# Patient Record
Sex: Male | Born: 1940 | Race: White | Hispanic: No | Smoking: Former smoker
Health system: Southern US, Community
[De-identification: ages and names within clinical notes are randomized; demographics above are authoritative.]

## PROBLEM LIST (undated history)

## (undated) DIAGNOSIS — R600 Localized edema: Secondary | ICD-10-CM

## (undated) DIAGNOSIS — E7800 Pure hypercholesterolemia, unspecified: Secondary | ICD-10-CM

## (undated) DIAGNOSIS — J449 Chronic obstructive pulmonary disease, unspecified: Secondary | ICD-10-CM

## (undated) DIAGNOSIS — A310 Pulmonary mycobacterial infection: Secondary | ICD-10-CM

## (undated) DIAGNOSIS — R609 Edema, unspecified: Secondary | ICD-10-CM

## (undated) DIAGNOSIS — Z87442 Personal history of urinary calculi: Secondary | ICD-10-CM

## (undated) DIAGNOSIS — H9192 Unspecified hearing loss, left ear: Secondary | ICD-10-CM

## (undated) DIAGNOSIS — F32A Depression, unspecified: Secondary | ICD-10-CM

## (undated) DIAGNOSIS — J189 Pneumonia, unspecified organism: Secondary | ICD-10-CM

## (undated) DIAGNOSIS — J329 Chronic sinusitis, unspecified: Secondary | ICD-10-CM

## (undated) DIAGNOSIS — I890 Lymphedema, not elsewhere classified: Secondary | ICD-10-CM

## (undated) DIAGNOSIS — M1990 Unspecified osteoarthritis, unspecified site: Secondary | ICD-10-CM

## (undated) DIAGNOSIS — K219 Gastro-esophageal reflux disease without esophagitis: Secondary | ICD-10-CM

## (undated) DIAGNOSIS — Z8719 Personal history of other diseases of the digestive system: Secondary | ICD-10-CM

## (undated) HISTORY — DX: Chronic obstructive pulmonary disease, unspecified: J44.9

## (undated) HISTORY — DX: Localized edema: R60.0

## (undated) HISTORY — PX: SHOULDER OPEN ROTATOR CUFF REPAIR: SHX2407

## (undated) HISTORY — PX: JOINT REPLACEMENT: SHX530

## (undated) HISTORY — DX: Edema, unspecified: R60.9

## (undated) HISTORY — PX: CATARACT EXTRACTION W/ INTRAOCULAR LENS  IMPLANT, BILATERAL: SHX1307

## (undated) HISTORY — DX: Unspecified hearing loss, left ear: H91.92

## (undated) HISTORY — DX: Chronic sinusitis, unspecified: J32.9

## (undated) HISTORY — PX: COLONOSCOPY W/ BIOPSIES AND POLYPECTOMY: SHX1376

---

## 1998-08-11 ENCOUNTER — Ambulatory Visit (HOSPITAL_COMMUNITY): Admission: RE | Admit: 1998-08-11 | Discharge: 1998-08-11 | Payer: Self-pay | Admitting: Internal Medicine

## 1998-08-11 ENCOUNTER — Encounter: Payer: Self-pay | Admitting: Internal Medicine

## 1999-03-28 ENCOUNTER — Ambulatory Visit (HOSPITAL_COMMUNITY): Admission: RE | Admit: 1999-03-28 | Discharge: 1999-03-28 | Payer: Self-pay | Admitting: Gastroenterology

## 1999-07-18 ENCOUNTER — Encounter: Admission: RE | Admit: 1999-07-18 | Discharge: 1999-07-18 | Payer: Self-pay | Admitting: Orthopedic Surgery

## 1999-07-18 ENCOUNTER — Encounter: Payer: Self-pay | Admitting: Orthopedic Surgery

## 2001-04-29 ENCOUNTER — Ambulatory Visit (HOSPITAL_COMMUNITY): Admission: RE | Admit: 2001-04-29 | Discharge: 2001-04-29 | Payer: Self-pay | Admitting: Gastroenterology

## 2001-04-29 ENCOUNTER — Encounter (INDEPENDENT_AMBULATORY_CARE_PROVIDER_SITE_OTHER): Payer: Self-pay | Admitting: *Deleted

## 2004-09-29 ENCOUNTER — Ambulatory Visit: Payer: Self-pay | Admitting: Internal Medicine

## 2005-05-12 ENCOUNTER — Ambulatory Visit: Payer: Self-pay | Admitting: Internal Medicine

## 2005-07-19 ENCOUNTER — Ambulatory Visit: Payer: Self-pay | Admitting: Pulmonary Disease

## 2005-07-25 ENCOUNTER — Ambulatory Visit: Payer: Self-pay | Admitting: Internal Medicine

## 2006-02-21 ENCOUNTER — Ambulatory Visit: Payer: Self-pay | Admitting: Internal Medicine

## 2006-03-15 ENCOUNTER — Ambulatory Visit: Payer: Self-pay | Admitting: Internal Medicine

## 2006-03-19 ENCOUNTER — Ambulatory Visit: Payer: Self-pay | Admitting: Cardiovascular Disease

## 2006-04-26 ENCOUNTER — Ambulatory Visit: Payer: Self-pay | Admitting: Internal Medicine

## 2006-08-28 ENCOUNTER — Ambulatory Visit: Payer: Self-pay | Admitting: Internal Medicine

## 2006-12-26 ENCOUNTER — Ambulatory Visit: Payer: Self-pay | Admitting: Internal Medicine

## 2007-03-08 ENCOUNTER — Ambulatory Visit: Payer: Self-pay | Admitting: Vascular Surgery

## 2007-08-09 ENCOUNTER — Ambulatory Visit: Payer: Self-pay | Admitting: Internal Medicine

## 2007-08-09 DIAGNOSIS — K5190 Ulcerative colitis, unspecified, without complications: Secondary | ICD-10-CM

## 2007-08-09 DIAGNOSIS — J328 Other chronic sinusitis: Secondary | ICD-10-CM

## 2007-08-09 DIAGNOSIS — K449 Diaphragmatic hernia without obstruction or gangrene: Secondary | ICD-10-CM | POA: Insufficient documentation

## 2007-08-09 DIAGNOSIS — J42 Unspecified chronic bronchitis: Secondary | ICD-10-CM | POA: Insufficient documentation

## 2007-12-08 ENCOUNTER — Ambulatory Visit (HOSPITAL_COMMUNITY): Admission: RE | Admit: 2007-12-08 | Discharge: 2007-12-08 | Payer: Self-pay | Admitting: Orthopedic Surgery

## 2008-01-06 ENCOUNTER — Telehealth (INDEPENDENT_AMBULATORY_CARE_PROVIDER_SITE_OTHER): Payer: Self-pay | Admitting: *Deleted

## 2008-02-05 ENCOUNTER — Ambulatory Visit: Payer: Self-pay | Admitting: Internal Medicine

## 2008-03-30 ENCOUNTER — Encounter: Payer: Self-pay | Admitting: Family

## 2008-05-12 ENCOUNTER — Encounter: Payer: Self-pay | Admitting: Family

## 2008-10-06 ENCOUNTER — Telehealth (INDEPENDENT_AMBULATORY_CARE_PROVIDER_SITE_OTHER): Payer: Self-pay | Admitting: *Deleted

## 2009-01-05 ENCOUNTER — Telehealth (INDEPENDENT_AMBULATORY_CARE_PROVIDER_SITE_OTHER): Payer: Self-pay | Admitting: *Deleted

## 2009-01-29 ENCOUNTER — Ambulatory Visit: Payer: Self-pay | Admitting: Diagnostic Radiology

## 2009-01-29 ENCOUNTER — Emergency Department (HOSPITAL_BASED_OUTPATIENT_CLINIC_OR_DEPARTMENT_OTHER): Admission: EM | Admit: 2009-01-29 | Discharge: 2009-01-29 | Payer: Self-pay | Admitting: Emergency Medicine

## 2009-02-04 ENCOUNTER — Ambulatory Visit: Payer: Self-pay | Admitting: Internal Medicine

## 2009-03-01 ENCOUNTER — Telehealth: Payer: Self-pay | Admitting: Internal Medicine

## 2009-06-21 ENCOUNTER — Ambulatory Visit: Payer: Self-pay | Admitting: Internal Medicine

## 2009-07-09 ENCOUNTER — Encounter: Payer: Self-pay | Admitting: Family

## 2009-08-03 ENCOUNTER — Ambulatory Visit: Payer: Self-pay | Admitting: Internal Medicine

## 2009-08-27 ENCOUNTER — Ambulatory Visit: Payer: Self-pay | Admitting: Family

## 2009-08-27 DIAGNOSIS — R030 Elevated blood-pressure reading, without diagnosis of hypertension: Secondary | ICD-10-CM | POA: Insufficient documentation

## 2009-08-27 DIAGNOSIS — Z87898 Personal history of other specified conditions: Secondary | ICD-10-CM

## 2009-08-27 DIAGNOSIS — I890 Lymphedema, not elsewhere classified: Secondary | ICD-10-CM

## 2009-08-27 LAB — CONVERTED CEMR LAB
ALT: 26 units/L (ref 0–53)
AST: 27 units/L (ref 0–37)
Albumin: 3.8 g/dL (ref 3.5–5.2)
Alkaline Phosphatase: 69 units/L (ref 39–117)
BUN: 19 mg/dL (ref 6–23)
Basophils Absolute: 0 10*3/uL (ref 0.0–0.1)
Basophils Relative: 0 % (ref 0.0–3.0)
Bilirubin, Direct: 0 mg/dL (ref 0.0–0.3)
CO2: 26 meq/L (ref 19–32)
Calcium: 9.2 mg/dL (ref 8.4–10.5)
Chloride: 107 meq/L (ref 96–112)
Creatinine, Ser: 1 mg/dL (ref 0.4–1.5)
Eosinophils Absolute: 0.6 10*3/uL (ref 0.0–0.7)
Eosinophils Relative: 5.6 % — ABNORMAL HIGH (ref 0.0–5.0)
GFR calc non Af Amer: 78.77 mL/min (ref >60)
Glucose, Bld: 92 mg/dL (ref 70–99)
HCT: 43.7 % (ref 39.0–52.0)
Hemoglobin: 14.1 g/dL (ref 13.0–17.0)
Lymphocytes Relative: 13.8 % (ref 12.0–46.0)
Lymphs Abs: 1.5 10*3/uL (ref 0.7–4.0)
MCHC: 32.4 g/dL (ref 30.0–36.0)
MCV: 89.6 fL (ref 78.0–100.0)
Monocytes Absolute: 0.8 10*3/uL (ref 0.1–1.0)
Monocytes Relative: 7.2 % (ref 3.0–12.0)
Neutro Abs: 7.9 10*3/uL — ABNORMAL HIGH (ref 1.4–7.7)
Neutrophils Relative %: 73.4 % (ref 43.0–77.0)
PSA: 1.33 ng/mL (ref 0.10–4.00)
Platelets: 269 10*3/uL (ref 150.0–400.0)
Potassium: 4.4 meq/L (ref 3.5–5.1)
RBC: 4.87 M/uL (ref 4.22–5.81)
RDW: 12.4 % (ref 11.5–14.6)
Sodium: 138 meq/L (ref 135–145)
Total Bilirubin: 0.8 mg/dL (ref 0.3–1.2)
Total Protein: 6.8 g/dL (ref 6.0–8.3)
WBC: 10.8 10*3/uL — ABNORMAL HIGH (ref 4.5–10.5)

## 2009-08-29 DIAGNOSIS — J45909 Unspecified asthma, uncomplicated: Secondary | ICD-10-CM | POA: Insufficient documentation

## 2009-09-09 ENCOUNTER — Encounter (INDEPENDENT_AMBULATORY_CARE_PROVIDER_SITE_OTHER): Payer: Self-pay | Admitting: *Deleted

## 2009-09-13 ENCOUNTER — Ambulatory Visit: Payer: Self-pay | Admitting: Family

## 2009-09-13 DIAGNOSIS — E785 Hyperlipidemia, unspecified: Secondary | ICD-10-CM

## 2009-09-13 LAB — CONVERTED CEMR LAB
Cholesterol: 151 mg/dL (ref 0–200)
HDL: 36.6 mg/dL — ABNORMAL LOW (ref >39.00)
LDL Cholesterol: 92 mg/dL (ref 0–99)
Total CHOL/HDL Ratio: 4
Triglycerides: 111 mg/dL (ref 0.0–149.0)
VLDL: 22.2 mg/dL (ref 0.0–40.0)

## 2009-11-01 ENCOUNTER — Telehealth (INDEPENDENT_AMBULATORY_CARE_PROVIDER_SITE_OTHER): Payer: Self-pay | Admitting: *Deleted

## 2009-12-23 ENCOUNTER — Ambulatory Visit: Payer: Self-pay | Admitting: Family Medicine

## 2009-12-23 DIAGNOSIS — J309 Allergic rhinitis, unspecified: Secondary | ICD-10-CM | POA: Insufficient documentation

## 2009-12-23 DIAGNOSIS — H9190 Unspecified hearing loss, unspecified ear: Secondary | ICD-10-CM | POA: Insufficient documentation

## 2009-12-23 DIAGNOSIS — L989 Disorder of the skin and subcutaneous tissue, unspecified: Secondary | ICD-10-CM | POA: Insufficient documentation

## 2009-12-28 ENCOUNTER — Encounter: Payer: Self-pay | Admitting: Family Medicine

## 2009-12-28 ENCOUNTER — Telehealth (INDEPENDENT_AMBULATORY_CARE_PROVIDER_SITE_OTHER): Payer: Self-pay | Admitting: *Deleted

## 2009-12-29 ENCOUNTER — Telehealth (INDEPENDENT_AMBULATORY_CARE_PROVIDER_SITE_OTHER): Payer: Self-pay | Admitting: *Deleted

## 2009-12-30 ENCOUNTER — Encounter (INDEPENDENT_AMBULATORY_CARE_PROVIDER_SITE_OTHER): Payer: Self-pay | Admitting: *Deleted

## 2010-02-01 ENCOUNTER — Ambulatory Visit: Payer: Self-pay | Admitting: Internal Medicine

## 2010-03-03 ENCOUNTER — Telehealth (INDEPENDENT_AMBULATORY_CARE_PROVIDER_SITE_OTHER): Payer: Self-pay | Admitting: *Deleted

## 2010-03-14 ENCOUNTER — Telehealth (INDEPENDENT_AMBULATORY_CARE_PROVIDER_SITE_OTHER): Payer: Self-pay | Admitting: *Deleted

## 2010-03-23 ENCOUNTER — Ambulatory Visit: Payer: Self-pay | Admitting: Family Medicine

## 2010-03-23 LAB — CONVERTED CEMR LAB
ALT: 31 units/L (ref 0–53)
Albumin: 3.6 g/dL (ref 3.5–5.2)
Basophils Absolute: 0.1 10*3/uL (ref 0.0–0.1)
Calcium: 9 mg/dL (ref 8.4–10.5)
Cholesterol: 146 mg/dL (ref 0–200)
Eosinophils Relative: 4.4 % (ref 0.0–5.0)
GFR calc non Af Amer: 79.56 mL/min (ref >60)
Glucose, Bld: 85 mg/dL (ref 70–99)
HCT: 40.3 % (ref 39.0–52.0)
HDL: 26.6 mg/dL — ABNORMAL LOW (ref >39.00)
Hemoglobin: 13.8 g/dL (ref 13.0–17.0)
LDL Cholesterol: 102 mg/dL — ABNORMAL HIGH (ref 0–99)
Lymphocytes Relative: 20.3 % (ref 12.0–46.0)
Lymphs Abs: 1.7 10*3/uL (ref 0.7–4.0)
Monocytes Relative: 7.5 % (ref 3.0–12.0)
Neutro Abs: 5.7 10*3/uL (ref 1.4–7.7)
PSA: 1.4 ng/mL (ref 0.10–4.00)
Potassium: 4.1 meq/L (ref 3.5–5.1)
RDW: 13.8 % (ref 11.5–14.6)
Sodium: 143 meq/L (ref 135–145)
TSH: 1.31 microintl units/mL (ref 0.35–5.50)
Total Protein: 6.5 g/dL (ref 6.0–8.3)
Triglycerides: 89 mg/dL (ref 0.0–149.0)
VLDL: 17.8 mg/dL (ref 0.0–40.0)
WBC: 8.6 10*3/uL (ref 4.5–10.5)

## 2010-03-30 ENCOUNTER — Encounter: Payer: Self-pay | Admitting: Family Medicine

## 2010-07-18 ENCOUNTER — Encounter: Payer: Self-pay | Admitting: Family Medicine

## 2010-07-18 LAB — HM COLONOSCOPY: HM Colonoscopy: NORMAL

## 2010-08-05 ENCOUNTER — Encounter (INDEPENDENT_AMBULATORY_CARE_PROVIDER_SITE_OTHER): Payer: Self-pay | Admitting: *Deleted

## 2010-08-16 ENCOUNTER — Ambulatory Visit
Admission: RE | Admit: 2010-08-16 | Discharge: 2010-08-16 | Payer: Self-pay | Source: Home / Self Care | Attending: Internal Medicine | Admitting: Internal Medicine

## 2010-09-06 NOTE — Letter (Signed)
   [facility] [address] [place], [place] [address] [phone]    September 13, 2009   HYLAND MOLLENKOPF [address] [place], [place] [address]  RE:  LAB RESULTS  Dear  Mr. Nicasio,  The following is an interpretation of your most recent lab tests.  Please take note of any instructions provided or changes to medications that have resulted from your lab work.  LIPID PANEL:  Stable - no changes needed Triglyceride: 111.0   Cholesterol: 151   LDL: 92   HDL: 36.60   Chol/HDL%:  4   Sincerely Yours,    Nance Pear FNP

## 2010-09-06 NOTE — Assessment & Plan Note (Signed)
Summary: cpx//pt will be fasting//lch   Vital Signs:  Patient profile:   70 year old male Height:      70.5 inches Weight:      182 pounds BMI:     25.84 Pulse rate:   70 / minute BP sitting:   108 / 60  (left arm)  Vitals Entered By: Malachi Bonds CMA (March 23, 2010 8:05 AM) CC: CPX AND LABS   History of Present Illness: 70 yo man here today for CPE.  no concerns  Here for Medicare AWV:  1.   Risk factors based on Past M, S, F history: chronic bronchitis- sees pulmonary, had recent PFTs, tolerating meds w/out difficulty hyperlipidemia- tolerating lipitor w/out difficulty, no abd pain, N/V, myalgias BPH- some relief w/ Flomax 2.   Physical Activities: hunt, fish, walk 2-3 miles 3.   Depression/mood: denies depressive sxs 4.   Hearing: no concerns, now wearing hearing aid 5.   ADL's: independent 6.   Fall Risk: not at risk 7.   Home Safety: lives w/ wife, feels safe at home 8.   Height, weight, &visual acuity: see vitals, corrected to 20/20 w/ glasses 9.   Counseling: has colonoscopy yearly w/ Dr Wallis Mart, has had Pneumovax 10.   Labs ordered based on risk factors: see A&P 11.           Referral Coordination: UTD, already had specialists in place 12.           Care Plan- see A&P 13.           Cognitive Assessment- AAOx3, 3/3 object recall, W-O-R-L-D  Preventive Screening-Counseling & Management  Alcohol-Tobacco     Alcohol drinks/day: <1     Smoking Status: quit     Year Quit: 1973  Caffeine-Diet-Exercise     Does Patient Exercise: yes      Sexual History:  currently monogamous.        Drug Use:  never.    Problems Prior to Update: 1)  Rhinitis  (ICD-477.9) 2)  Unspecified Disorder of Skin&subcutaneous Tissue  (ICD-709.9) 3)  Decreased Hearing  (ICD-389.9) 4)  Hyperlipidemia  (ICD-272.4) 5)  Elevated Blood Pressure Without Diagnosis of Hypertension  (ICD-796.2) 6)  Preventive Health Care  (ICD-V70.0) 7)  Benign Prostatic Hypertrophy, Mild, Hx of   (ICD-V13.8) 8)  Lymphedema  (ICD-457.1) 9)  Bronchitis, Chronic  (ICD-491.9) 10)  Other Chronic Sinusitis  (ICD-473.8) 11)  Colitis  (ICD-558.9) 12)  Hiatal Hernia  (ICD-553.3) 13)  Asthma  (ICD-493.90)  Current Medications (verified): 1)  Asacol Hd 800 Mg Tbec (Mesalamine) .... Take 2 in Am, 2 At Lunch, 2 in Pm 2)  Protonix 40 Mg  Tbec (Pantoprazole Sodium) .... Take 1 By Mouth Once Daily 3)  Lipitor 10 Mg  Tabs (Atorvastatin Calcium) .... Take 1 By Mouth Once Daily 4)  Multivitamins   Tabs (Multiple Vitamin) .... Take 1 By Mouth Once Daily 5)  Singulair 10 Mg  Tabs (Montelukast Sodium) .... Take 1 By Mouth Once Daily 6)  Advair Diskus 100-50 Mcg/dose Aepb (Fluticasone-Salmeterol) .Marland Kitchen.. 1 Puff and Rinse Mouth, Twice Daily 7)  Proventil Hfa 108 (90 Base) Mcg/act  Aers (Albuterol Sulfate) .... 2 Puffs Four Times A Day As Needed 8)  Afrin Saline Nasal Mist 0.65 %  Soln (Saline) .... As Needed 9)  Mucinex 600 Mg  Tb12 (Guaifenesin) .... As Needed 10)  Tylenol Extra Strength 500 Mg  Tabs (Acetaminophen) .... Take As Directed As Needed 11)  Claritin 10 Mg Tabs (Loratadine) .Marland KitchenMarland KitchenMarland Kitchen  Take 1 Tablet By Mouth Every Morning 12)  Flomax 0.4 Mg Caps (Tamsulosin Hcl) .... One Tablet By Mouth Daily 13)  Nasonex 50 Mcg/act Susp (Mometasone Furoate) .... 2 Sprays Each Nostril Once Daily  Allergies (verified): 1)  ! 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  The patient denies anorexia, fever, weight loss, weight gain, vision loss, decreased hearing, hoarseness, chest pain, syncope, dyspnea on exertion, headaches, abdominal pain, melena, severe indigestion/heartburn, hematuria, suspicious skin lesions, depression, abnormal bleeding, enlarged lymph nodes, and testicular masses.         edema from chronic lymphedema blood in stool from UC cough from asthma  Physical Exam  General:  Well-developed,well-nourished,in no acute distress; alert,appropriate and cooperative throughout examination Head:  Normocephalic and atraumatic without obvious abnormalities.  Eyes:  PERRL, EOMI, fundoscopic exam deferred to ophthamology Ears:  External ear exam shows no significant lesions or deformities.  Otoscopic examination reveals clear canals, tympanic membranes are intact bilaterally without bulging, retraction, inflammation or discharge. Hearing is grossly normal bilaterally. Nose:  External nasal examination shows no deformity or inflammation. Nasal mucosa are pink and moist without lesions or exudates. Mouth:  Oral mucosa and oropharynx without lesions or exudates.  Teeth in good repair. Neck:  No deformities, masses, or tenderness noted. Lungs:  Normal respiratory effort, chest expands symmetrically. Lungs are clear to auscultation, no crackles or wheezes. Heart:  Normal rate and regular rhythm. S1 and S2 normal without gallop, murmur, click, rub or other extra sounds. Abdomen:  Bowel sounds positive,abdomen soft and non-tender without masses, organomegaly or hernias noted. Rectal:  No external abnormalities noted. Normal sphincter tone. No rectal masses or tenderness. Genitalia:  Testes bilaterally descended without nodularity, tenderness or masses. No scrotal masses or lesions. No penis lesions or urethral discharge. Prostate:  smooth without palpable nodules, + mild enlargement  noted Msk:  No deformity or scoliosis noted of thoracic or lumbar spine.  no joint tenderness and no joint swelling.   Pulses:  +2 carotid, radial, DP Extremities:  LLE 1+ edema Neurologic:  alert & oriented X3, strength normal in all extremities, and DTRs symmetrical and normal.   Skin:  Intact without suspicious lesions or rashes Cervical Nodes:  No lymphadenopathy noted Inguinal Nodes:  No significant adenopathy Psych:  Cognition and judgment appear intact. Alert and cooperative with normal attention span and concentration. No apparent delusions, illusions, hallucinations   Impression & Recommendations:  Problem # 1:  Bells (ICD-V70.0) Assessment Unchanged pt's PE WNL.  check labs.  UTD on health maintainence.  anticipatory guidance provided. Orders: Specimen Handling (99000) TLB-BMP (Basic Metabolic Panel-BMET) (87681-LXBWIOM) TLB-CBC Platelet - w/Differential (85025-CBCD) TLB-TSH (Thyroid Stimulating Hormone) (84443-TSH) Medicare -1st Annual Wellness Visit [phone])  Problem # 2:  HYPERLIPIDEMIA (ICD-272.4) Assessment: Unchanged  due for lab check, adjust meds as needed. His updated medication list for this problem includes:    Lipitor 10 Mg Tabs (Atorvastatin calcium) .Marland KitchenMarland KitchenMarland KitchenMarland Kitchen  Take 1 by mouth once daily  Orders: Venipuncture (82060) Specimen Handling (99000) TLB-Lipid Panel (80061-LIPID) TLB-Hepatic/Liver Function Pnl (80076-HEPATIC)  Problem # 3:  BENIGN PROSTATIC HYPERTROPHY, MILD, HX OF (ICD-V13.8) Assessment: Unchanged doing well on Flomax.  check PSA. Orders: Specimen Handling (99000) TLB-PSA (Prostate Specific Antigen) (84153-PSA) DRE (R5615) Prostate / PSA (Medicare) (P7943)  Problem # 4:  BRONCHITIS, CHRONIC (ICD-491.9) Assessment: Unchanged follows w/ pulm  Problem # 5:  LYMPHEDEMA (ICD-457.1) Assessment: Unchanged script given for new compression socks  Complete Medication List: 1)  Asacol Hd 800 Mg Tbec (Mesalamine) .... Take 2 in am,  2 at lunch, 2 in pm 2)  Protonix 40 Mg Tbec (Pantoprazole sodium) .... Take 1 by mouth once daily 3)  Lipitor 10 Mg Tabs (Atorvastatin calcium) .... Take 1 by mouth once daily 4)  Multivitamins Tabs (Multiple vitamin) .... Take 1 by mouth once daily 5)  Singulair 10 Mg Tabs (Montelukast sodium) .... Take 1 by mouth once daily 6)  Advair Diskus 100-50 Mcg/dose Aepb (Fluticasone-salmeterol) .Marland Kitchen.. 1 puff and rinse mouth, twice daily 7)  Proventil Hfa 108 (90 Base) Mcg/act Aers (Albuterol sulfate) .... 2 puffs four times a day as needed 8)  Afrin Saline Nasal Mist 0.65 % Soln (Saline) .... As needed 9)  Mucinex 600 Mg Tb12 (Guaifenesin) .... As needed 10)  Tylenol Extra Strength 500 Mg Tabs (Acetaminophen) .... Take as directed as needed 11)  Claritin 10 Mg Tabs (Loratadine) .... Take 1 tablet by mouth every morning 12)  Flomax 0.4 Mg Caps (Tamsulosin hcl) .... One tablet by mouth daily 13)  Nasonex 50 Mcg/act Susp (Mometasone furoate) .... 2 sprays each nostril once daily 14)  Knee High Compression Socks  .Marland Kitchen.. 40-50 mmhg.  wear daily as directed  Patient Instructions: 1)  Follow up in 6 months or as needed 2)  Your exam looks great!  Keep up the good work! 3)  We'll notify you of your lab results 4)  Call with any questions or concerns 5)  Have a great fall season! Prescriptions: KNEE HIGH COMPRESSION SOCKS 40-50 mmHG.  wear daily as directed  #1 x 0   Entered and Authorized by:   Annye Asa MD   Signed by:   Annye Asa MD on 03/23/2010   Method used:   Print then Give to Patient   RxID:   [id number]

## 2010-09-06 NOTE — Procedures (Signed)
Summary: Colonoscopy/[facility]  Colonoscopy/[facility]   Imported By: Edmonia James 09/15/2009 09:34:18  _____________________________________________________________________  External Attachment:    Type:   Image     Comment:   External Document

## 2010-09-06 NOTE — Assessment & Plan Note (Signed)
Summary: 3 MONTH OV//PH//rsh from bump//lch   Vital Signs:  Patient profile:   70 year old male Height:      70.5 inches Weight:      103.75 pounds Pulse rate:   60 / minute BP sitting:   100 / 60  Vitals Entered By: Verdie Mosher (Dec 23, 2009 9:19 AM) CC: 3 MONTH F/U  20db HL: Left  500 hz: 20db 1000 hz: 20db 2000 hz: No Response 4000 hz: No Response Right  500 hz: 20db 1000 hz: 20db 2000 hz: 20db 4000 hz: 20db    History of Present Illness: 70 yo man here today for 3 month f/u on  1) BP- BP normal today.  not on meds.  no hx of elevated BP prior to 1 reading in January.  denies CP, SOB, HAs, visual changes, edema more than usual, N/V.  2) nipple irritation- no itching, no drainage or bleeding.  no lump present  3) Asthma- follows w/ Dr Annamaria Boots, has appt upcoming next month.  4) UC- follows w/ Dr Cristina Gong  5) Seasonal allergies- on Claritin and Singulair daily, not using steroid nasal spray.  c/o AM congestion.  some PND.  using Netti pot.  6) Hearing loss- L>R, occasional ringing in the ear.  Preventive Screening-Counseling & Management  Alcohol-Tobacco     Smoking Status: quit      Sexual History:  currently monogamous.    Allergies (verified): 1)  ! * Avelox  Past History:  Past Medical History: Ulcerative colitis Asthma Chronic bronchitis Sinusitis Peripheral edema left lower leg hearing loss L ear  Social History: Sexual History:  currently monogamous  Review of Systems      See HPI  Physical Exam  General:  Well-developed,well-nourished,in no acute distress; alert,appropriate and cooperative throughout examination Head:  Normocephalic and atraumatic without obvious abnormalities.  Ears:  External ear exam shows no significant lesions or deformities.  Otoscopic examination reveals clear canals, tympanic membranes are intact bilaterally without bulging, retraction, inflammation or discharge. Hearing is grossly normal bilaterally. Nose:   edematous turbinates bilaterally Mouth:  + PND Breasts:  R nipple with normal skin on areoloa, no thickening, redness or discharge no lumps bilaterally Lungs:  faint scattered end expiratory wheezes Heart:  Normal rate and regular rhythm. S1 and S2 normal without gallop, murmur, click, rub or other extra sounds. Pulses:  +2 carotid, radial, DP Extremities:  LLE 1+ edema   Impression & Recommendations:  Problem # 1:  ELEVATED BLOOD PRESSURE WITHOUT DIAGNOSIS OF HYPERTENSION (ICD-796.2) Assessment Improved BP normal to low.  pt reports this is his usual.  no need for meds will continue to monitor at future visits.  Problem # 2:  UNSPECIFIED DISORDER OF SKIN&SUBCUTANEOUS TISSUE (ICD-709.9) Assessment: New pt's nipple WNL for appearance and to palpation.  sxs resolved spontaneously.  Problem # 3:  ASTHMA (ICD-493.90) Assessment: Unchanged follows w/ Dr Annamaria Boots His updated medication list for this problem includes:    Singulair 10 Mg Tabs (Montelukast sodium) .Marland Kitchen... Take 1 by mouth once daily    Advair Diskus 100-50 Mcg/dose Aepb (Fluticasone-salmeterol) .Marland Kitchen... 1 puff and rinse mouth, twice daily    Proventil Hfa 108 (90 Base) Mcg/act Aers (Albuterol sulfate) .Marland Kitchen... 2 puffs four times a day as needed  Problem # 4:  COLITIS (ICD-558.9) Assessment: Unchanged follows w/ Dr Cristina Gong  Problem # 5:  RHINITIS (ICD-477.9) Assessment: New  given edematous turbinates, AM congestion, and PND will start steroid nasal spray. His updated medication list for this problem includes:  Afrin Saline Nasal Mist 0.65 % Soln (Saline) .Marland Kitchen... As needed    Claritin 10 Mg Tabs (Loratadine) .Marland Kitchen... Take 1 tablet by mouth every morning    Nasonex 50 Mcg/act Susp (Mometasone furoate) .Marland Kitchen... 2 sprays each nostril once daily  Orders: Prescription Created Electronically [phone])  Problem # 6:  DECREASED HEARING (ICD-389.9) Assessment: New pt w/ hearing loss in L ear.  refer for complete audiologic  evaluation. Orders: Audiometry [id number]) Audiology (Audio)  Complete Medication List: 1)  Asacol Hd 800 Mg Tbec (Mesalamine) .... Take 2 in am, 2 at lunch, 2 in pm 2)  Protonix 40 Mg Tbec (Pantoprazole sodium) .... Take 1 by mouth once daily 3)  Lipitor 10 Mg Tabs (Atorvastatin calcium) .... Take 1 by mouth once daily 4)  Multivitamins Tabs (Multiple vitamin) .... Take 1 by mouth once daily 5)  Singulair 10 Mg Tabs (Montelukast sodium) .... Take 1 by mouth once daily 6)  Advair Diskus 100-50 Mcg/dose Aepb (Fluticasone-salmeterol) .Marland Kitchen.. 1 puff and rinse mouth, twice daily 7)  Proventil Hfa 108 (90 Base) Mcg/act Aers (Albuterol sulfate) .... 2 puffs four times a day as needed 8)  Afrin Saline Nasal Mist 0.65 % Soln (Saline) .... As needed 9)  Mucinex 600 Mg Tb12 (Guaifenesin) .... As needed 10)  Tylenol Extra Strength 500 Mg Tabs (Acetaminophen) .... Take as directed as needed 11)  Claritin 10 Mg Tabs (Loratadine) .... Take 1 tablet by mouth every morning 12)  Flomax 0.4 Mg Caps (Tamsulosin hcl) .... One tablet by mouth daily 13)  Nasonex 50 Mcg/act Susp (Mometasone furoate) .... 2 sprays each nostril once daily  Patient Instructions: 1)  Please schedule your complete physical for August- do not eat before this appt 2)  Use the nasal spray daily as directed 3)  Someone will call you with your audiology appt 4)  Your blood pressure looks fantastic!  Keep up the good work! 5)  Call with any questions or concerns 6)  Enjoy your summer! Prescriptions: NASONEX 50 MCG/ACT SUSP (MOMETASONE FUROATE) 2 sprays each nostril once daily  #1 x 3   Entered and Authorized by:   Annye Asa MD   Signed by:   Annye Asa MD on 12/23/2009   Method used:   Electronically to        [facility] [phone]* (retail)       [address]       [place], [place]  [address]       Ph: [phone]       Fax: [phone]   RxID:   [id number]

## 2010-09-06 NOTE — Assessment & Plan Note (Signed)
Summary: ROV/ MBW   Primary Provider/Referring Provider:  Nance Pear FNP  CC:  follow up visit-review PFTs..  History of Present Illness: 02/05/08- 70 year old man returning for follow-up of asthmatic bronchitis and history of chronic sinusitis.  When last here in January he needed treatment for acute exacerbation.  In the past week.  He has felt some chest congestion and nonproductive cough.  He said his sinuses feel comfortable.  He uses a saline lavage p.r.n.  Uses Mucinex p.r.n.Marland Kitchen  Denies fever, purulent or bloody sputum, headache, or wheeze.  02/04/09- asthmatic bronchitis, hx chronic sinusitis, ulcerative colitis Doxycycline cleared him up well in March. Went to [facility] 6/25 for chills, tight nonproductive chest cough, nasal congestion. Responded to neb and Z pak- OK by the next day, except persistent nasal congestion all Spring. No sore throat, some occipital headaches. Using saline nasalspray relieves headache. Feels a little chest tightnes and coughs white to yellow, without active wheeze.  August 03, 2009- Asthmatic bronchitis, hx chronic sinusitis, ulcerative colitis No complaints, feeling well today. Got flu vax. Does cough productively off and on all day, scant amounts. Daily mucinex does help. little aware of dyspnea- not worse. It doesn't wake him. Chronic lymphedema left foot- "they don't know why". Chronic use of pressure hose. Denies hx of DVT. He asks about alternative primary med referrals.  February 01, 2010- Asthmatic bronchitis, hx chronic sinusitis, ulcerative colitis Scheduled f/u. Feels stable. Routine daily cough yellow green- chronic and ok as long as he can cough it up. Mucinex helps. Walks 3 miles 3 days/ week. Denies chest pain, blood, palpitation, glands. Left foot edema unchanged- elastic sock. Nasonex helps reduce morning nasal congestion . Not much flare in Spring pollen this year. No recent steroid. Some daily cough w/ wheeze, not limiting and not daily  need for rescue inhaler. CXR 08/03/09- Stable COPD PFT. 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     Asthma History    Initial Asthma Severity Rating:    Age range: 12+ years    Symptoms: daily    Nighttime Awakenings: 0-2/month    Interferes w/ normal activity: no limitations    SABA use (not for EIB): >2 days/week but not >1X/day    Asthma Severity Assessment: Moderate Persistent   Preventive Screening-Counseling & Management  Alcohol-Tobacco     Smoking Status: quit     Year Quit: 1976  Current Medications (verified): 1)  Asacol Hd 800 Mg Tbec (Mesalamine) .... Take 2 in Am, 2 At Lunch, 2 in Pm 2)  Protonix 40 Mg  Tbec (Pantoprazole Sodium) .... Take 1 By Mouth Once Daily 3)  Lipitor 10 Mg  Tabs (Atorvastatin Calcium) .... Take 1 By Mouth Once Daily 4)  Multivitamins   Tabs (Multiple Vitamin) .... Take 1 By Mouth Once Daily 5)  Singulair 10 Mg  Tabs (Montelukast Sodium) .... Take 1 By Mouth Once Daily 6)  Advair Diskus 100-50 Mcg/dose Aepb (Fluticasone-Salmeterol) .Marland Kitchen.. 1 Puff and Rinse Mouth, Twice Daily 7)  Proventil Hfa 108 (90 Base) Mcg/act  Aers (Albuterol Sulfate) .... 2 Puffs Four Times A Day As Needed 8)  Afrin Saline Nasal Mist 0.65 %  Soln (Saline) .... As Needed 9)  Mucinex 600 Mg  Tb12 (Guaifenesin) .... As Needed 10)  Tylenol Extra Strength 500 Mg  Tabs (Acetaminophen) .... Take As Directed As Needed 11)  Claritin 10 Mg Tabs (Loratadine) .... Take 1 Tablet By Mouth Every Morning 12)  Flomax 0.4 Mg Caps (  Tamsulosin Hcl) .... One Tablet By Mouth Daily 13)  Nasonex 50 Mcg/act Susp (Mometasone Furoate) .... 2 Sprays Each Nostril Once Daily  Allergies (verified): 1)  ! 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  The patient denies shortness of breath with activity, shortness of breath at rest, non-productive cough, coughing up blood, chest pain, irregular heartbeats, acid heartburn, indigestion, loss of appetite, weight change, abdominal pain, difficulty swallowing, sore throat, tooth/dental problems, headaches, and sneezing.    Vital Signs:  Patient profile:   70 year old male Height:      70.5 inches Weight:      181 pounds BMI:     25.70 O2 Sat:      96 % on Room air Pulse rate:   69 / minute BP sitting:   100 / 60  (left arm) Cuff size:   large  Vitals Entered By: Clayborne Dana CMA (February 01, 2010 10:05 AM)  O2 Flow:  Room air CC: follow up visit-review PFTs.   Physical Exam  Additional Exam:  General: A/Ox3; pleasant and cooperative, NAD, comfortable appearing SKIN: no rash, lesions NODES: no lymphadenopathy HEENT: [place]/AT, EOM- WNL, Conjuctivae- clear, PERRLA, TM-WNL, Nose- clear, Throat- clear and wnl, Mallampati II, no postnasal drip NECK: Supple w/ fair ROM, JVD- none, normal carotid impulses w/o bruits Thyroid- CHEST: coarse bibasilar airway noise without cough, dullness or wheeze, loose cough noted once after deep breath HEART: RRR, no m/g/r heard ABDOMEN: Soft and nl;  VOH:YWVP, nl pulses, no edema but with elastic hose on left leg NEURO: Grossly intact to observation      CXR  Procedure date:  08/03/2009  Findings:      DG CHEST 2 VIEW - [id number]   Clinical Data: Bronchitis.  Pole.     CHEST - 2 VIEW   Comparison: Chest x-ray 01/29/2009   Findings: The lungs are hyperinflated.  Mild bronchial wall thickening noted.  Heart and mediastinal contours are stable and within normal limits.  Prominence of the central pulmonary arteries, stable.  Soft tissue anchors in the left shoulder noted. No acute osseous abnormality.   IMPRESSION:   Stable chest radiograph. Emphysema/COPD.  No acute findings identified.   Read By:  Sheppard Evens,  M.D.     Released By:  Sheppard Evens,  M.D.  _____________________________________________________________________   Impression & Recommendations:  Problem # 1:  RHINITIS (ICD-477.9)  Chronic rhinitis/ recurrent rhinosinusitis,He will continue Nasonex.  His updated medication list for this problem includes:    Afrin Saline Nasal Mist 0.65 % Soln (Saline) .Marland Kitchen... As needed    Claritin 10 Mg Tabs (Loratadine) .Marland Kitchen... Take 1 tablet by mouth every morning    Nasonex 50 Mcg/act Susp (Mometasone furoate) .Marland Kitchen... 2 sprays each nostril once daily  Problem # 2:  BRONCHITIS, CHRONIC (ICD-491.9)  Chronic asthmatic bronchitis with good  control. Chronic mucus production. Meds reviewed. As long as his joints allow he will continue present management.  Rec flu shot in the Fall. walk as able.  Other Orders: Est. Patient Level IV (10626)  Patient Instructions: 1)  Please schedule a follow-up appointment in 6 months. 2)  Call as needed     CXR  Procedure date:  08/03/2009  Findings:      DG CHEST 2 VIEW - [id number]   Clinical Data: Bronchitis.  Pole.   CHEST - 2 VIEW   Comparison: Chest x-ray 01/29/2009   Findings: The lungs are hyperinflated.  Mild bronchial wall thickening noted.  Heart and mediastinal contours are stable and within normal limits.  Prominence of the central pulmonary arteries, stable.  Soft tissue anchors in the left shoulder noted. No acute osseous abnormality.   IMPRESSION:   Stable chest radiograph.  Emphysema/COPD.  No acute findings identified.   Read By:  Sheppard Evens,  M.D.     Released By:  Sheppard Evens,  M.D.  _____________________________________________________________________

## 2010-09-06 NOTE — Progress Notes (Signed)
Summary: NEEDS [place] PRESCRIPTION  Phone Note Call from Patient Call back at [phone]   Caller: Patient Summary of Call: PATIENT NEEDS REFILL FOR Rule = [address]  PER HIS INSURANCE COMPANY---NEEDS TO BE A 90 DAY PRESCRIPTION, NOT A 30 DAY PRESCRIPTION Initial call taken by: Berneta Sages,  March 03, 2010 1:07 PM  Follow-up for Phone Call        left detailed msg on voicemail prescription sent to pharmacy for 3 month supply ..........Marland KitchenMalachi Bonds CMA  March 03, 2010 2:17 PM     Prescriptions: NASONEX 50 MCG/ACT SUSP (MOMETASONE FUROATE) 2 sprays each nostril once daily  #3 x 1   Entered by:   Malachi Bonds CMA   Authorized by:   Annye Asa MD   Signed by:   Malachi Bonds CMA on 03/03/2010   Method used:   Electronically to        [facility] [phone]* (retail)       [address]       [place], [place]  [address]       Ph: [phone]       Fax: [phone]   RxID:   [id number]

## 2010-09-06 NOTE — Progress Notes (Signed)
Summary: NEED REFERRAL TO ENT   Phone Note Call from Patient Call back at Home Phone [phone] Call back at [place]   Caller: Patient Summary of Call: PT CALLED WAS SEEN TUE 12/28/09 BY AUDIOLOGIST Lollie Sails, WHO RECOMMENDS [facility] --PATIENT SAYS [place] RECOMMEND ENT BEFORE HEARING [place] [facility] is available anytime next week Initial call taken by: Verdie Mosher,  Dec 29, 2009 11:34 AM  Follow-up for Phone Call        ok for referral Follow-up by: Annye Asa MD,  Dec 29, 2009 11:48 AM

## 2010-09-06 NOTE — Progress Notes (Signed)
Summary: Refill Request  Phone Note Refill Request Call back at [phone] Message from:  Pharmacy on Dec 28, 2009 9:15 AM  Refills Requested: Medication #1:  NASONEX 50 MCG/ACT SUSP 2 sprays each nostril once daily.   Dosage confirmed as above?Dosage Confirmed   [facility]ed[facility] [address]  Next Appointment Scheduled: 8.17.11 Initial call taken by: Elna Breslow,  Dec 28, 2009 9:15 AM  Follow-up for Phone Call        filled electronically 12/23/09.[name]  Dec 29, 2009 8:27 AM  Follow-up by: [name],  Dec 29, 2009 8:27 AM

## 2010-09-06 NOTE — Miscellaneous (Signed)
  Clinical Lists Changes  Observations: Added new observation of COLONRECACT: Repeat colonoscopy in 1 year.    (07/09/2009 11:40) Added new observation of COLONOSCOPY:  Results: Colitis.       Location:  [facility].    (07/09/2009 11:39) Added new observation of PNEUMOVAX: Historical (03/15/2006 11:25) Added new observation of ZOSTAVAX: Historical (08/07/2005 11:26) Added new observation of TD BOOSTER: Historical (08/08/1995 11:25)      Immunization History:  Tetanus/Td Immunization History:    Tetanus/Td:  historical (08/08/1995)  Pneumovax Immunization History:    Pneumovax:  historical (03/15/2006)  Zostavax History:    Zostavax # 1:  historical (08/07/2005)    Immunization History:  Tetanus/Td Immunization History:    Tetanus/Td:  Historical (08/08/1995)  Pneumovax Immunization History:    Pneumovax:  Historical (03/15/2006)  Zostavax History:    Zostavax # 1:  Historical (08/07/2005)        Colonoscopy  Procedure date:  07/09/2009  Findings:       Results: Colitis.       Location:  [facility].     Colonoscopy  Procedure date:  07/09/2009  Comments:      Repeat colonoscopy in 1 year.

## 2010-09-06 NOTE — Procedures (Signed)
Summary: Colonoscopy/[facility]   Imported By: Edmonia James 09/15/2009 09:36:09  _____________________________________________________________________  External Attachment:    Type:   Image     Comment:   External Document

## 2010-09-06 NOTE — Therapy (Signed)
Summary: [facility]   Imported By: Edmonia James 01/07/2010 09:38:53  _____________________________________________________________________  External Attachment:    Type:   Image     Comment:   External Document

## 2010-09-06 NOTE — Assessment & Plan Note (Signed)
Summary: new medicare to est- referred from pulmonary//ccm   Vital Signs:  Patient profile:   70 year old male Height:      70.5 inches Weight:      183.0 pounds BMI:     25.98 Temp:     98.5 degrees F oral Pulse rate:   71 / minute Resp:     14 per minute BP sitting:   126 / 90  (left arm) Cuff size:   large  Vitals Entered By: Georgette Dover (August 27, 2009 8:51 AM) CC: New patient establish: 1.) CPX-not fasting  2.) Right nipple concerns Comments REVIEWED MED LIST, PATIENT AGREED DOSE AND INSTRUCTION CORRECT    Primary Care Provider:  Albesa Seen  CC:  New patient establish: 1.) CPX-not fasting  2.) Right nipple concerns.  History of Present Illness: [name] is a 70 year old male who presents today to establish care.  He was previously seeing Dr. Rex Kras [facility] at [facility].    Notes 3 year history of lymphedema of the LLE.  Notes + itching of his right nipple x 3 weeks  Ulcerative colitis- takes asacol, notes occasional flare ups (Sees Dr. Cristina Gong)  Asthmatic bronchitis- sees Dr. Annamaria Boots- notes well controlled  Preventative- does not exercise.  Used to walk 2 miles a day until he developed lymphadema.   Diet- notes that his diet is not always healthy.  Last colo 11/10- + inflammation, + polyps- benign per patient.    Allergies: 1)  ! 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 Resp: chronic cough CV:  Denies Chest Pain, occasion SOB with exertion which is associated with wheezing and asthma.   GI:  Denies nausea or vomitting or diarrhea GU: Denies dysuria, notes urine "dribbles", occasional frequency, weak urinary stream Lymphatic: Denies lymphadenopathy Musculoskeletal:  Notes Bilateral shoulder pain Skin:  Notes easily skin  tears Psychiatric: Denies depression or anxiety Neuro: Denies numbness or weakness     Physical Exam  General:  Well-developed,well-nourished,in no acute distress; alert,appropriate and cooperative throughout examination Head:  Normocephalic and atraumatic without obvious abnormalities.  Eyes:  PERRLA, +senile arcus Ears:  External ear exam shows no significant lesions or deformities.  Otoscopic examination reveals clear canals, tympanic membranes are intact bilaterally without bulging, retraction, inflammation or discharge. Hearing is grossly normal bilaterally. Mouth:  Oral mucosa and oropharynx without lesions or exudates.  Teeth in good repair. Neck:  No deformities, masses, or tenderness noted. Breasts:  R nipple- areoloa with dry skin, no nipple discharge, no thickening, no palpable masses.   Lungs:  R sided exp wheeze, good air movement to bases, no increase WOB Heart:  Normal rate and regular rhythm. S1 and S2 normal without gallop, murmur, click, rub or other extra sounds. Abdomen:  Bowel sounds positive,abdomen soft and non-tender without masses, organomegaly or hernias noted. Rectal:  No external abnormalities noted. Normal sphincter tone. No rectal masses or tenderness. Prostate:  smooth without palpable nodules, + mild enlargement noted Msk:  No deformity or scoliosis noted of thoracic or lumbar spine.  no joint tenderness and no joint swelling.   Extremities:  LLE 2+ edema Neurologic:  alert & oriented X3, strength normal in all extremities, and DTRs symmetrical and normal.   Skin:  Dry skin, +  ecchymosis noted on L forearm Cervical Nodes:  No lymphadenopathy noted Psych:  Cognition and judgment appear intact. Alert and cooperative with normal attention span and concentration. No apparent delusions, illusions, hallucinations   Impression & Recommendations:  Problem # 1:  [place] (ICD-V70.0) Assessment Comment Only Reviewed immunizations- up to date.   Counselled on diet/exercise, plan to check PSA,  up to date on colo Orders: Venipuncture ([address]109) TLB-BMP (Basic Metabolic Panel-BMET) (60454-UJWJXBJ) TLB-CBC Platelet - w/Differential (85025-CBCD) TLB-Hepatic/Liver Function Pnl (80076-HEPATIC) TLB-PSA (Prostate Specific Antigen) (84153-PSA) Prescription Created Electronically [phone])  Problem # 2:  BENIGN PROSTATIC HYPERTROPHY, MILD, HX OF (ICD-V13.8) Assessment: New Plan to check PSA, will give trial of flomax given patient's complaints and enlarged prostate on exam  Problem # 3:  ELEVATED BLOOD PRESSURE WITHOUT DIAGNOSIS OF HYPERTENSION (ICD-796.2) Patient counselled on a low sodium diet. Plan to repeat BP next visit  Problem # 4:  ASTHMA (ICD-493.90) Assessment: Unchanged + mild  wheezing today on exam, but patient notes that he is clinically at baseline. Notes that his wheezing ususally clears with cough.  Will plan continued follow up with pulmonary, and continue below regimen for maintenence.   His updated medication list for this problem includes:    Singulair 10 Mg Tabs (Montelukast sodium) .Marland Kitchen... Take 1 by mouth once daily    Foradil Aerolizer 12 Mcg Caps (Formoterol fumarate) .Marland Kitchen... 1 puff two times a day    Asmanex 120 Metered Doses 220 Mcg/inh Aepb (Mometasone furoate) ..... Inhale 1 puff two times a day    Proventil Hfa 108 (90 Base) Mcg/act Aers (Albuterol sulfate) .Marland Kitchen... 2 puffs four times a day as needed  Problem # 5:  LYMPHEDEMA (ICD-457.1) Assessment: Unchanged Continue compression stocking- at baseline  Complete Medication List: 1)  Asacol Hd 800 Mg Tbec (Mesalamine) .... Take 2 in am, 2 at lunch, 2 in pm 2)  Protonix 40 Mg Tbec (Pantoprazole sodium) .... Take 1 by mouth once daily 3)  Lipitor 10 Mg Tabs (Atorvastatin calcium) .... Take 1 by mouth once daily 4)  Multivitamins Tabs (Multiple vitamin) .... Take 1 by mouth once daily 5)  Singulair 10 Mg Tabs (Montelukast sodium) .... Take 1 by mouth once daily 6)   Foradil Aerolizer 12 Mcg Caps (Formoterol fumarate) .Marland Kitchen.. 1 puff two times a day 7)  Asmanex 120 Metered Doses 220 Mcg/inh Aepb (Mometasone furoate) .... Inhale 1 puff two times a day 8)  Proventil Hfa 108 (90 Base) Mcg/act Aers (Albuterol sulfate) .... 2 puffs four times a day as needed 9)  Afrin Saline Nasal Mist 0.65 % Soln (Saline) .... As needed 10)  Mucinex 600 Mg Tb12 (Guaifenesin) .... As needed 11)  Tylenol Extra Strength 500 Mg Tabs (Acetaminophen) .... Take as directed as needed 12)  Claritin 10 Mg Tabs (Loratadine) .... Take 1 tablet by mouth every morning 13)  Flomax 0.4 Mg Caps (Tamsulosin hcl) .... One tablet by mouth daily  Patient Instructions: 1)  Follow up in 2 weeks- come fasting to this appoinment. 2)  Apply a good oinment daily such as Eucerin to your skin including right nipple daily 3)  Call if symptoms worsen or if swelling/nipple discharge 4)  It is important that you exercise regularly at least 20 minutes 5 times a week. If you develop chest pain, have severe difficulty breathing, or feel very tired , stop exercising immediately and seek medical attention. 5)  Limit your Sodium (Salt) to less than 2 grams a day(slightly less than 1/2 a teaspoon) to  prevent fluid retention, swelling, or worsening of symptoms. 6)  Try to eat a low cholesterol diet. Prescriptions: FLOMAX 0.4 MG CAPS (TAMSULOSIN HCL) one tablet by mouth daily  #30 x 1   Entered and Authorized by:   Nance Pear FNP   Signed by:   Nance Pear FNP on 08/27/2009   Method used:   Electronically to        [facility] [phone]* (retail)       [address]       [place], [place]  [address]       Ph: [phone]       Fax: [phone]   RxID:   [id number]

## 2010-09-06 NOTE — Progress Notes (Signed)
Summary: flomax refill   Phone Note Refill Request Message from:  Patient on March 14, 2010 10:19 AM  Refills Requested: Medication #1:  FLOMAX 0.4 MG CAPS one tablet by mouth daily   Dosage confirmed as above?Dosage Confirmed   [facility] Requested[facility] [address]   Next Appointment Scheduled: AUGUST 17TH 2011 Initial call taken by: Osborn Coho,  March 14, 2010 10:19 AM    Prescriptions: FLOMAX 0.4 MG CAPS (TAMSULOSIN HCL) one tablet by mouth daily  #90 x 1   Entered by:   Malachi Bonds CMA   Authorized by:   Annye Asa MD   Signed by:   Malachi Bonds CMA on 03/14/2010   Method used:   Electronically to        [facility] [phone]* (retail)       [address]       [place], [place]  [address]       Ph: [phone]       Fax: [phone]   RxID:   [id number]

## 2010-09-06 NOTE — Letter (Signed)
Summary: Primary Care Consult Scheduled Letter   at [facility]   [place], [place] [address]   Phone: [phone]  Fax: [phone]      12/30/2009 MRN: [id number]  [name] [address] [place], [place]  [address]    Dear Mr. Larouche,    We have scheduled an appointment for you.  At the recommendation of Dr. Annye Asa, we have scheduled you a consult with Dr. Izora Gala of [facility], [facility], Th[facility] on 01-11-2010 at 9:30am.  Their address is [address], [address], [place] [address]. The office phone number is [phone].  If this appointment day and time is not convenient for you, please feel free to call the office of the doctor you are being referred to at the number listed above and reschedule the appointment.    It is important for you to keep your scheduled appointments. We are here to make sure you are given good patient care.   Thank you,    Renee, Patient Care Coordinator [facility] at [facility]

## 2010-09-06 NOTE — Letter (Signed)
   [facility] [address] [place], [place] [address] [phone]    August 27, 2009   NATNAEL BIEDERMAN [address] [place], [place] [address]  RE:  LAB RESULTS  Dear  Mr. Ludwick,  The following is an interpretation of your most recent lab tests.  Please take note of any instructions provided or changes to medications that have resulted from your lab work.  ELECTROLYTES:  Good - no changes needed  KIDNEY FUNCTION TESTS:  Good - no changes needed  LIVER FUNCTION TESTS:  Good - no changes needed  DIABETIC STUDIES:  Excellent - no changes needed Blood Glucose: 92    CBC:  Good - no changes needed   Sincerely Yours,    Nance Pear FNP

## 2010-09-06 NOTE — Progress Notes (Signed)
Summary: medication change to Advair  Phone Note Call from Patient Call back at [phone]   Caller: Patient Call For: young Summary of Call: Wants to switch asmanex 120, foradil 64mg to advair, RE: insurance charges 2  times as much and advair will only be half of this./[facility] [address]  Initial call taken by: JNetta Neat  November 01, 2009 10:11 AM  Follow-up for Phone Call        dr young is this something you would approve for pt, he states this would save him alot of money   TMaryann ConnersCLake [facility] November 01, 2009 1:47 PM   Additional Follow-up for Phone Call Additional follow up Details #1::        Done- please let him know Additional Follow-up by: CDeneise LeverMD,  November 02, 2009 8:58 AM    Additional Follow-up for Phone Call Additional follow up Details #2::    called and spoke with pt.   pt aware rx sent to pharmacy.  pt requested 90 day supply instead of monthly.  therefore new rx sent.  Megan Reynolds LPN  March 29, 2315490:08AM   New/Updated Medications: ADVAIR DISKUS 100-50 MCG/DOSE AEPB (FLUTICASONE-SALMETEROL) 1 puff and rinse mouth, twice daily Prescriptions: ADVAIR DISKUS 100-50 MCG/DOSE AEPB (FLUTICASONE-SALMETEROL) 1 puff and rinse mouth, twice daily  #3 x 3   Entered by:   MMatthew FolksLPN   Authorized by:   CDeneise LeverMD   Signed by:   MMatthew FolksLPN on 067/61/9509  Method used:   Electronically to        [facility][phone] (retail)       [address]      [place]  [address]      Ph: [phone]      Fax: [phone]  RxID:   [id number] DISKUS 100-50 MCG/DOSE AEPB (FLUTICASONE-SALMETEROL) 1 puff and rinse mouth, twice daily  #1 x prn   Entered and Authorized by:   CDeneise LeverMD   Signed by:   CDeneise LeverMD on 11/02/2009   Method used:   Electronically to        [facility][phone] (retail)       [address]      [place]   [address]      Ph: [phone]      Fax: [phone]  [facility]   [phone]

## 2010-09-06 NOTE — Miscellaneous (Signed)
Summary: Orders Update pft charges  Clinical Lists Changes  Orders: Added new Service order of Carbon Monoxide diffusing w/capacity (94720) - Signed Added new Service order of Lung Volumes (94240) - Signed Added new Service order of Spirometry (Pre & Post) (94060) - Signed 

## 2010-09-06 NOTE — Letter (Signed)
Summary: [facility]  [facility]   Imported By: Edmonia James 04/21/2010 09:55:10  _____________________________________________________________________  External Attachment:    Type:   Image     Comment:   External Document

## 2010-09-06 NOTE — Assessment & Plan Note (Signed)
Summary: 2 week fu/ns/fasting/kdc   Vital Signs:  Patient profile:   70 year old male Weight:      178 pounds Pulse rate:   68 / minute BP sitting:   104 / 60  (left arm)  Vitals Entered By: Malachi Bonds (September 13, 2009 8:19 AM) CC: 2 week roa   Primary Care Provider:  Albesa Seen  CC:  2 week roa.  History of Present Illness: Mr [name] is a 70 year old male who presents today for follow up.  Notes that he started flomax as instructed last visit and he has noted improvement in nocturia and daytime frequency.  Also noted improved urinary stream.  PSA last visit was normal. He continues to have itching and dry skin of his right nipple.    Allergies: 1)  ! * Avelox  Physical Exam  General:  Well-developed,well-nourished,in no acute distress; alert,appropriate and cooperative throughout examination Breasts:  R nipple with some dry skin on areoloa, no thickening, redness or discharge Lungs:  Normal respiratory effort, chest expands symmetrically. Lungs are clear to auscultation, no crackles or wheezes. Heart:  Normal rate and regular rhythm. S1 and S2 normal without gallop, murmur, click, rub or other extra sounds.   Impression & Recommendations:  Problem # 1:  ELEVATED BLOOD PRESSURE WITHOUT DIAGNOSIS OF HYPERTENSION (ICD-796.2) Assessment Improved BP today is actually a little low, patient is asymptomatic.  Continue to monitor off of bp meds BP today: 104/60 Prior BP: 126/90 (08/27/2009)  Labs Reviewed: Creat: 1.0 (08/27/2009)  Problem # 2:  BENIGN PROSTATIC HYPERTROPHY, MILD, HX OF (ICD-V13.8) Assessment: Improved PSA normal, symptoms improved with flomax, continue same.   Problem # 3:  HYPERLIPIDEMIA (LGX-211.4) Assessment: Comment Only Will check f/u LFT's His updated medication list for this problem includes:    Lipitor 10 Mg Tabs (Atorvastatin calcium) .Marland Kitchen... Take 1 by mouth once daily  Orders: Venipuncture (94174) TLB-Lipid Panel  (80061-LIPID)  Complete Medication List: 1)  Asacol Hd 800 Mg Tbec (Mesalamine) .... Take 2 in am, 2 at lunch, 2 in pm 2)  Protonix 40 Mg Tbec (Pantoprazole sodium) .... Take 1 by mouth once daily 3)  Lipitor 10 Mg Tabs (Atorvastatin calcium) .... Take 1 by mouth once daily 4)  Multivitamins Tabs (Multiple vitamin) .... Take 1 by mouth once daily 5)  Singulair 10 Mg Tabs (Montelukast sodium) .... Take 1 by mouth once daily 6)  Foradil Aerolizer 12 Mcg Caps (Formoterol fumarate) .Marland Kitchen.. 1 puff two times a day 7)  Asmanex 120 Metered Doses 220 Mcg/inh Aepb (Mometasone furoate) .... Inhale 1 puff two times a day 8)  Proventil Hfa 108 (90 Base) Mcg/act Aers (Albuterol sulfate) .... 2 puffs four times a day as needed 9)  Afrin Saline Nasal Mist 0.65 % Soln (Saline) .... As needed 10)  Mucinex 600 Mg Tb12 (Guaifenesin) .... As needed 11)  Tylenol Extra Strength 500 Mg Tabs (Acetaminophen) .... Take as directed as needed 12)  Claritin 10 Mg Tabs (Loratadine) .... Take 1 tablet by mouth every morning 13)  Flomax 0.4 Mg Caps (Tamsulosin hcl) .... One tablet by mouth daily  Patient Instructions: 1)  Please schedule a follow-up appointment in 3 months, sooner if problems or concerns.  2)  Complete your lab work prior to leaving today. 3)  Call if you develop redness/swelling/thickening of the skin or drainage from your right nipple or if the itching does not improve with application of  vaseline twice daily to this area. Prescriptions: FLOMAX 0.4 MG  CAPS (TAMSULOSIN HCL) one tablet by mouth daily  #90 x 1   Entered and Authorized by:   Nance Pear FNP   Signed by:   Nance Pear FNP on 09/13/2009   Method used:   Electronically to        [facility] [phone]* (retail)       [address]       [place], [place]  [address]       Ph: [phone]       Fax: [phone]   RxID:   [id number] LIPITOR 10 MG  TABS (ATORVASTATIN CALCIUM) take 1 by mouth  once daily  #90 x 1   Entered and Authorized by:   Nance Pear FNP   Signed by:   Nance Pear FNP on 09/13/2009   Method used:   Electronically to        [facility] [phone]* (retail)       [address]       [place], [place]  [address]       Ph: [phone]       Fax: [phone]   RxID:   [id number]

## 2010-09-08 NOTE — Assessment & Plan Note (Signed)
Summary: rov 6 months///kp   Primary Provider/Referring Provider:  Annye Asa MD  CC:  6 month follow up visit-asthma no unusual SOB no wheezing.  History of Present Illness:  August 03, 2009- Asthmatic bronchitis, hx chronic sinusitis, ulcerative colitis No complaints, feeling well today. Got flu vax. Does cough productively off and on all day, scant amounts. Daily mucinex does help. little aware of dyspnea- not worse. It doesn't wake him. Chronic lymphedema left foot- "they don't know why". Chronic use of pressure hose. Denies hx of DVT. He asks about alternative primary med referrals.  February 01, 2010- Asthmatic bronchitis, hx chronic sinusitis, ulcerative colitis Scheduled f/u. Feels stable. Routine daily cough yellow green- chronic and ok as long as he can cough it up. Mucinex helps. Walks 3 miles 3 days/ week. Denies chest pain, blood, palpitation, glands. Left foot edema unchanged- elastic sock. Nasonex helps reduce morning nasal congestion . Not much flare in Spring pollen this year. No recent steroid. Some daily cough w/ wheeze, not limiting and not daily need for rescue inhaler. CXR 08/03/09- Stable COPD PFT. 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  August 16, 2010-  Asthmatic bronchitis, hx chronic sinusitis, ulcerative colitis Nurse-CC: 6 month follow up visit-asthma no unusual SOB no wheezing Last CXR 1 year ago. No acute events or active concers, Discussed pneumonia vaccine. Mild daily cough with white phlegm.     Asthma History    Asthma Control Assessment:    Age range: 12+ years    Symptoms: 0-2 days/week    Nighttime Awakenings: 0-2/month    Interferes w/ normal activity: no limitations    SABA use (not for EIB): 0-2 days/week    Asthma Control Assessment: Well Controlled   Preventive Screening-Counseling & Management  Alcohol-Tobacco     Alcohol drinks/day: <1     Smoking Status: quit     Year  Quit: 1973  Current Medications (verified): 1)  Asacol Hd 800 Mg Tbec (Mesalamine) .... Take 2 in Am, 2 At Lunch, 2 in Pm 2)  Protonix 40 Mg  Tbec (Pantoprazole Sodium) .... Take 1 By Mouth Once Daily 3)  Lipitor 10 Mg  Tabs (Atorvastatin Calcium) .... Take 1 By Mouth Once Daily 4)  Multivitamins   Tabs (Multiple Vitamin) .... Take 1 By Mouth Once Daily 5)  Singulair 10 Mg  Tabs (Montelukast Sodium) .... Take 1 By Mouth Once Daily 6)  Advair Diskus 100-50 Mcg/dose Aepb (Fluticasone-Salmeterol) .Marland Kitchen.. 1 Puff and Rinse Mouth, Twice Daily 7)  Proventil Hfa 108 (90 Base) Mcg/act  Aers (Albuterol Sulfate) .... 2 Puffs Four Times A Day As Needed 8)  Afrin Saline Nasal Mist 0.65 %  Soln (Saline) .... As Needed 9)  Mucinex 600 Mg  Tb12 (Guaifenesin) .... As Needed 10)  Tylenol Extra Strength 500 Mg  Tabs (Acetaminophen) .... Take As Directed As Needed 11)  Claritin 10 Mg Tabs (Loratadine) .... Take 1 Tablet By Mouth Every Morning 12)  Flomax 0.4 Mg Caps (Tamsulosin Hcl) .... One Tablet By Mouth Daily 13)  Nasonex 50 Mcg/act Susp (Mometasone Furoate) .... 2 Sprays Each Nostril Once Daily 14)  Knee High Compression Socks .Marland Kitchen.. 40-50 Mmhg.  Wear Daily As Directed  Allergies (verified): 1)  ! 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  The patient denies shortness of breath with activity, shortness of breath at rest, non-productive cough, coughing up blood, chest pain, irregular heartbeats, acid heartburn, indigestion, loss of appetite, weight change, abdominal pain, difficulty swallowing, sore throat, tooth/dental problems, headaches, nasal congestion/difficulty breathing through nose, and sneezing.    Vital Signs:  Patient profile:   70 year old male Height:      70.5 inches Weight:      186.25 pounds BMI:     26.44 O2 Sat:      96 % on Room air Pulse rate:   67 / minute BP sitting:   126 / 68  (right arm) Cuff size:   regular  Vitals Entered By: Clayborne Dana CMA (August 16, 2010 8:55 AM)  O2 Flow:  Room air CC: 6 month follow up visit-asthma no unusual SOB no wheezing   Physical Exam  Additional Exam:  General: A/Ox3; pleasant and cooperative, NAD, comfortable appearing SKIN: no rash, lesions NODES: no lymphadenopathy HEENT: [place]/AT, EOM- WNL, Conjuctivae- clear, PERRLA, TM-WNL, Nose- clear, Throat- clear and wnl, Mallampati II, no postnasal drip NECK: Supple w/ fair ROM, JVD- none, normal carotid impulses w/o bruits Thyroid- CHEST: coarse bibasilar airway noise without cough, dullness or wheeze, loose cough noted once after deep breath HEART: RRR, no m/g/r heard ABDOMEN: Soft and nl;  WGY:KZLD, nl pulses, no edema but with elastic hose on left leg- constant NEURO: Grossly intact to observation      Impression & Recommendations:  Problem # 1:  BRONCHITIS, CHRONIC (ICD-491.9)  Mild chronic bronchits but he is much better off than he was 10 years ago and we discussed this.  I do want to update CXR.  Problem # 2:  RHINITIS (ICD-477.9)  Well controlled with Nasonex when needed.  His updated medication list for this problem includes:    Afrin Saline Nasal Mist 0.65 % Soln (Saline) .Marland Kitchen... As needed    Claritin 10 Mg Tabs (Loratadine) .Marland Kitchen... Take 1 tablet by mouth  every morning    Nasonex 50 Mcg/act Susp (Mometasone furoate) .Marland Kitchen... 2 sprays each nostril once daily  Problem # 3:  OTHER CHRONIC SINUSITIS (ICD-473.8) Ability to reduce frequency of sinusitis flares marked onset of the quieter interval we are noting. We discussed changes in his exposures that have contributed.   Other Orders: Est. Patient Level IV (35701) T-2 View CXR ([id number])  Patient Instructions: 1)  Please schedule a follow-up appointment in 1 year. 2)  A chest x-ray has been recommended.  Your imaging study may require preauthorization.  3)  Proventil HFA refilled Prescriptions: PROVENTIL HFA 108 (90 BASE) MCG/ACT  AERS (ALBUTEROL SULFATE) 2 puffs four times a day as needed  #3 x 3   Entered and Authorized by:   Deneise Lever MD   Signed by:   Deneise Lever MD on 08/16/2010   Method used:   Print then Give to Patient   RxID:   [id number]

## 2010-09-08 NOTE — Procedures (Signed)
Summary: Colonoscopy/[facility]   Imported By: Edmonia James 08/15/2010 09:10:53  _____________________________________________________________________  External Attachment:    Type:   Image     Comment:   External Document

## 2010-09-08 NOTE — Miscellaneous (Signed)
  Clinical Lists Changes  Observations: Added new observation of COLONRECACT: Repeat colonoscopy in 1 year.    (07/18/2010 16:29) Added new observation of COLONOSCOPY:  Results: Normal. Results: Diverticulosis.       Location:  [facility].    (07/18/2010 16:29)          Colonoscopy  Procedure date:  07/18/2010  Findings:       Results: Normal. Results: Diverticulosis.       Location:  [facility].     Comments:      Repeat colonoscopy in 1 year.

## 2010-09-13 ENCOUNTER — Encounter: Payer: Self-pay | Admitting: Family Medicine

## 2010-09-17 ENCOUNTER — Encounter: Payer: Self-pay | Admitting: Family Medicine

## 2010-09-21 ENCOUNTER — Other Ambulatory Visit: Payer: Self-pay | Admitting: Dermatology

## 2010-09-22 ENCOUNTER — Encounter: Payer: Self-pay | Admitting: Family Medicine

## 2010-09-22 ENCOUNTER — Ambulatory Visit (INDEPENDENT_AMBULATORY_CARE_PROVIDER_SITE_OTHER): Payer: Medicare Other | Admitting: Family Medicine

## 2010-09-22 DIAGNOSIS — K4090 Unilateral inguinal hernia, without obstruction or gangrene, not specified as recurrent: Secondary | ICD-10-CM | POA: Insufficient documentation

## 2010-09-22 DIAGNOSIS — E785 Hyperlipidemia, unspecified: Secondary | ICD-10-CM

## 2010-09-22 DIAGNOSIS — B351 Tinea unguium: Secondary | ICD-10-CM

## 2010-09-22 DIAGNOSIS — R030 Elevated blood-pressure reading, without diagnosis of hypertension: Secondary | ICD-10-CM

## 2010-09-23 ENCOUNTER — Ambulatory Visit: Payer: Self-pay | Admitting: Family Medicine

## 2010-09-26 ENCOUNTER — Encounter (INDEPENDENT_AMBULATORY_CARE_PROVIDER_SITE_OTHER): Payer: Self-pay | Admitting: *Deleted

## 2010-09-26 ENCOUNTER — Other Ambulatory Visit (INDEPENDENT_AMBULATORY_CARE_PROVIDER_SITE_OTHER): Payer: Medicare Other

## 2010-09-26 ENCOUNTER — Other Ambulatory Visit: Payer: Self-pay | Admitting: Family Medicine

## 2010-09-26 DIAGNOSIS — E785 Hyperlipidemia, unspecified: Secondary | ICD-10-CM

## 2010-09-26 LAB — HEPATIC FUNCTION PANEL
ALT: 28 U/L (ref 0–53)
AST: 25 U/L (ref 0–37)
Albumin: 3.5 g/dL (ref 3.5–5.2)
Total Protein: 6.1 g/dL (ref 6.0–8.3)

## 2010-09-26 LAB — LIPID PANEL: Cholesterol: 147 mg/dL (ref 0–200)

## 2010-10-03 ENCOUNTER — Encounter: Payer: Self-pay | Admitting: Family Medicine

## 2010-10-04 NOTE — Assessment & Plan Note (Signed)
Summary: rto 6 mo --cbs   Vital Signs:  Patient profile:   70 year old male Weight:      179 pounds BMI:     25.41 Pulse rate:   78 / minute BP sitting:   104 / 58  (left arm)  Vitals Entered By: Malachi Bonds CMA (September 22, 2010 2:13 PM) CC: 6 month roa    History of Present Illness: 70 yo man here today for f/u on  1) Hyperlipidemia- tolerating statin w/out difficulty.  no N/V, abd pain, myalgias.  2) elevated BP- today's BP is on low side but denies dizziness, fatigue, SOB.  3) L inguinal hernia- 1st noticed 2-3 weeks ago.  feels a bulge, particularly when coughing.  denies pain.  4) L great toenail fungus- using funginail.  fungus only on 1 nail.    Current Medications (verified): 1)  Asacol Hd 800 Mg Tbec (Mesalamine) .... Take 2 in Am, 2 At Lunch, 2 in Pm 2)  Protonix 40 Mg  Tbec (Pantoprazole Sodium) .... Take 1 By Mouth Once Daily 3)  Lipitor 10 Mg  Tabs (Atorvastatin Calcium) .... Take 1 By Mouth Once Daily 4)  Multivitamins   Tabs (Multiple Vitamin) .... Take 1 By Mouth Once Daily 5)  Singulair 10 Mg  Tabs (Montelukast Sodium) .... Take 1 By Mouth Once Daily 6)  Advair Diskus 100-50 Mcg/dose Aepb (Fluticasone-Salmeterol) .Marland Kitchen.. 1 Puff and Rinse Mouth, Twice Daily 7)  Proventil Hfa 108 (90 Base) Mcg/act  Aers (Albuterol Sulfate) .... 2 Puffs Four Times A Day As Needed 8)  Afrin Saline Nasal Mist 0.65 %  Soln (Saline) .... As Needed 9)  Mucinex 600 Mg  Tb12 (Guaifenesin) .... As Needed 10)  Tylenol Extra Strength 500 Mg  Tabs (Acetaminophen) .... Take As Directed As Needed 11)  Claritin 10 Mg Tabs (Loratadine) .... Take 1 Tablet By Mouth Every Morning 12)  Flomax 0.4 Mg Caps (Tamsulosin Hcl) .... One Tablet By Mouth Daily 13)  Nasonex 50 Mcg/act Susp (Mometasone Furoate) .... 2 Sprays Each Nostril Once Daily 14)  Knee High Compression Socks .Marland Kitchen.. 40-50 Mmhg.  Wear Daily As Directed  Allergies (verified): 1)  ! * Avelox  Past History:  Past medical, surgical,  family and social histories (including risk factors) reviewed for relevance to current acute and chronic problems.  Past Medical History: Reviewed history from 12/23/2009 and no changes required. Ulcerative colitis Asthma Chronic bronchitis Sinusitis Peripheral edema left lower leg hearing loss L ear  Past Surgical History: Reviewed history from 02/04/2009 and no changes required. Bilateral rotator cuff  Family History: Reviewed history from 02/12/2008 and no changes required. sister died COPD  mother died 16 MI GF died 12 CVA sibling died from ruptured blood vessel in brain  Social History: Reviewed history from 03/23/2010 and no changes required. 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  Neck:  No deformities, masses, or tenderness noted. Lungs:  Normal respiratory effort, chest expands symmetrically. Lungs are clear to auscultation, no crackles or wheezes. Heart:  Normal rate and regular rhythm. S1 and S2 normal without gallop, murmur, click, rub or other extra sounds. Abdomen:  soft, NT/ND, +BS.  + L inguinal hernia- easily reducible Pulses:  +2 carotid, radial, DP Extremities:  no C/C/E fungal infxn  of L great toenail   Impression & Recommendations:  Problem # 1:  HYPERLIPIDEMIA (KCM-034.4) Assessment Unchanged  will return for fasting labs.  adjust meds as needed. His updated medication list for this problem includes:    Lipitor 10 Mg Tabs (Atorvastatin calcium) .Marland Kitchen... Take 1 by mouth once daily  Orders: Prescription Created Electronically [phone])  Problem # 2:  ELEVATED BLOOD PRESSURE WITHOUT DIAGNOSIS OF HYPERTENSION (ICD-796.2) Assessment: Improved BP normal today- on low  side.  asymptomatic.  Problem # 3:  DERMATOPHYTOSIS OF NAIL (ICD-110.1) Assessment: New continue fungi-nail.  no systemic antifungals b/c only 1 nail is affected and pt already on statin.  Problem # 4:  INGUINAL HERNIA, LEFT (ICD-550.90) Assessment: New  refer to surgery.  Orders: Surgical Referral (Surgery)  Complete Medication List: 1)  Asacol Hd 800 Mg Tbec (Mesalamine) .... Take 2 in am, 2 at lunch, 2 in pm 2)  Protonix 40 Mg Tbec (Pantoprazole sodium) .... Take 1 by mouth once daily 3)  Lipitor 10 Mg Tabs (Atorvastatin calcium) .... Take 1 by mouth once daily 4)  Multivitamins Tabs (Multiple vitamin) .... Take 1 by mouth once daily 5)  Singulair 10 Mg Tabs (Montelukast sodium) .... Take 1 by mouth once daily 6)  Advair Diskus 100-50 Mcg/dose Aepb (Fluticasone-salmeterol) .Marland Kitchen.. 1 puff and rinse mouth, twice daily 7)  Proventil Hfa 108 (90 Base) Mcg/act Aers (Albuterol sulfate) .... 2 puffs four times a day as needed 8)  Afrin Saline Nasal Mist 0.65 % Soln (Saline) .... As needed 9)  Mucinex 600 Mg Tb12 (Guaifenesin) .... As needed 10)  Tylenol Extra Strength 500 Mg Tabs (Acetaminophen) .... Take as directed as needed 11)  Claritin 10 Mg Tabs (Loratadine) .... Take 1 tablet by mouth every morning 12)  Flomax 0.4 Mg Caps (Tamsulosin hcl) .... One tablet by mouth daily 13)  Nasonex 50 Mcg/act Susp (Mometasone furoate) .... 2 sprays each nostril once daily 14)  Knee High Compression Socks  .Marland Kitchen.. 40-50 mmhg.  wear daily as directed  Patient Instructions: 1)  Schedule a fasting lab visit 2)  Hepatic Panel prior to visit ICD-9: 272.4 3)  Lipid panel prior to visit ICD-9 : 272.4 4)  We'll plan on your complete physical in August 5)  Someone will call you with your surgery appt to discuss your hernia 6)  Continue the Fungi-nail 7)  If you have unrelenting abdominal pain or inability to push the lump back in at your hernia- this is an emergency! Prescriptions: LIPITOR 10 MG  TABS  (ATORVASTATIN CALCIUM) take 1 by mouth once daily  #90 Tablet x 3   Entered and Authorized by:   Annye Asa MD   Signed by:   Annye Asa MD on 09/22/2010   Method used:   Electronically to        [facility] [phone]* (retail)       [address]       [place], [place]  [address]       Ph: [phone]       Fax: [phone]   RxID:   [id number]    Orders Added: 1)  Surgical Referral [Surgery] 2)  Est. Patient Level IV [54982] 3)  Prescription Created Electronically [phone]

## 2010-10-17 ENCOUNTER — Other Ambulatory Visit (HOSPITAL_COMMUNITY): Payer: Self-pay | Admitting: Surgery

## 2010-10-17 ENCOUNTER — Encounter (HOSPITAL_COMMUNITY)
Admission: RE | Admit: 2010-10-17 | Discharge: 2010-10-17 | Disposition: A | Discharge status: Home / Self Care | Payer: Medicare Other | Source: Ambulatory Visit | Attending: Surgery | Admitting: Surgery

## 2010-10-17 ENCOUNTER — Ambulatory Visit (HOSPITAL_COMMUNITY)
Admission: RE | Admit: 2010-10-17 | Discharge: 2010-10-17 | Disposition: A | Discharge status: Home / Self Care | Payer: Medicare Other | Source: Ambulatory Visit | Attending: Surgery | Admitting: Surgery

## 2010-10-17 DIAGNOSIS — K469 Unspecified abdominal hernia without obstruction or gangrene: Secondary | ICD-10-CM

## 2010-10-17 DIAGNOSIS — I498 Other specified cardiac arrhythmias: Secondary | ICD-10-CM | POA: Insufficient documentation

## 2010-10-17 DIAGNOSIS — Z01810 Encounter for preprocedural cardiovascular examination: Secondary | ICD-10-CM | POA: Insufficient documentation

## 2010-10-17 LAB — BASIC METABOLIC PANEL
CO2: 23 mEq/L (ref 19–32)
Calcium: 9 mg/dL (ref 8.4–10.5)
Creatinine, Ser: 0.91 mg/dL (ref 0.4–1.5)
GFR calc Af Amer: >60 mL/min (ref >60)
GFR calc non Af Amer: >60 mL/min (ref >60)

## 2010-10-17 LAB — SURGICAL PCR SCREEN: MRSA, PCR: NEGATIVE

## 2010-10-17 LAB — CBC
Hemoglobin: 14.1 g/dL (ref 13.0–17.0)
MCH: 29.1 pg (ref 26.0–34.0)
MCHC: 34.8 g/dL (ref 30.0–36.0)
Platelets: 255 10*3/uL (ref 150–400)
RDW: 13.3 % (ref 11.5–15.5)

## 2010-10-20 ENCOUNTER — Ambulatory Visit (HOSPITAL_COMMUNITY)
Admission: RE | Admit: 2010-10-20 | Discharge: 2010-10-20 | Disposition: A | Discharge status: Home / Self Care | Payer: Medicare Other | Source: Ambulatory Visit | Attending: Surgery | Admitting: Surgery

## 2010-10-20 DIAGNOSIS — J45909 Unspecified asthma, uncomplicated: Secondary | ICD-10-CM | POA: Insufficient documentation

## 2010-10-20 DIAGNOSIS — K4020 Bilateral inguinal hernia, without obstruction or gangrene, not specified as recurrent: Secondary | ICD-10-CM | POA: Insufficient documentation

## 2010-10-20 DIAGNOSIS — K429 Umbilical hernia without obstruction or gangrene: Secondary | ICD-10-CM | POA: Insufficient documentation

## 2010-10-21 NOTE — Op Note (Signed)
NAME:  [name], HONOR NO.:  [id number]  MEDICAL RECORD NO.:  [id number]           PATIENT TYPE:  O  LOCATION:  [facility]                         FACILITY:  [facility]  PHYSICIAN:  Coralie Keens, M.D. DATE OF BIRTH:  [birth year]  DATE OF PROCEDURE:  10/20/2010 DATE OF DISCHARGE:                              OPERATIVE REPORT   PREOPERATIVE DIAGNOSES:  Left inguinal hernia, umbilical hernia.  POSTOPERATIVE DIAGNOSES:  Bilateral inguinal hernias, umbilical hernia.  PROCEDURES:  Bilateral laparoscopic inguinal hernia repair with mesh, umbilical hernia repair.  SURGEON:  Coralie Keens, MD  ANESTHESIA:  General with 0.5% Marcaine.  ESTIMATED BLOOD LOSS:  Minimal.  FINDINGS:  The patient was found to have a moderate-sized direct left inguinal hernia and a very small direct right inguinal hernia.  Both were repaired with 6-inch x 6-inch pieces of Ultrapro Prolene mesh.  PROCEDURE IN DETAIL:  The patient was brought to the operative and identified as [name].  He was placed on the operating room table and anesthesia was induced.  His abdomen was then prepped and draped in usual sterile fashion.  Using a 15 blade, a small vertical incision was made below the umbilicus.  This was carried down to fascia which was opened with a scalpel.  The rectus muscle was identified and elevated.  The dissecting balloon was then passed underneath the rectus muscle and manipulated towards the pubis.  The preperitoneal space was then dissected out with the dissecting balloon under direct vision.  At this point the dissecting balloon was removed and insufflation was begun with carbon dioxide.  Two 5 mm ports was then placed in the patient's lower midline under direct vision.  The patient had slightly incarcerated direct left inguinal hernia.  I was able to easily reduce the contents laparoscopically.  There was no evidence of indirect hernias.  I evaluated the cord structures.   Cooper ligament was easily identified.  I then turned my attention toward the right groin.  The patient had a very weakened direct area in the right groin and I believe the hernia was developing in this area.  I evaluated the testicular cord and structures.  There was no evidence of indirect hernia.  At this point 2 separate pieces of 6 inch x 6 inch Ultrapro Prolene mesh were brought into the field.  I cut them both to the appropriate size.  I then placed the first piece of mesh through the port of the umbilicus. I opened it up in onlay fashion on the right inguinal area.  I then used the laparoscopic absorbable tacker to tack the mesh to the Cooper ligament up the medial abdominal wall and out laterally.  Good coverage of the direct hernia defect as well as the testicular cord and these structures were achieved.  I then brought a second piece of mesh onto the field, fashioned appropriately and placed near the port of the umbilicus.  I placed this as an onlay on the left inguinal floor.  I tacked it to the Carnuel ligament up to medial abdominal wall out laterally.  Again good coverage of the direct defect and the cord structures  appeared to be achieved.  Hemostasis also appeared to be achieved.  The midline ports were removed and the preperitoneal space was seen to collapse appropriately.  I then removed the port at the umbilicus.  The fascia at the umbilicus closed with figure-of-eight 0 Vicryl suture.  The patient had a very small umbilical hernia fascial defect which I closed with a figure-of-eight 0 Novafil suture.  All incisions were then anesthetized with Marcaine.  I performed ilioinguinal nerve blocks with Marcaine as well.  I then closed all skin incisions with 4-0 Monocryl subcuticular sutures.  Steri- Strips and Band-Aids were then applied.  The patient tolerated the procedure well.  All counts were correct at the end of the procedure. The patient was then extubated in the  operating room and taken in stable condition to recovery room.     Coralie Keens, M.D.     DB/MEDQ  D:  10/20/2010  T:  10/21/2010  Job:  [id number]  Electronically Signed by Coralie Keens M.D. on 10/21/2010 08:46:55 AM

## 2010-10-25 NOTE — Letter (Signed)
Summary: [facility]   Imported By: Laural Benes 10/19/2010 11:04:30  _____________________________________________________________________  External Attachment:    Type:   Image     Comment:   External Document

## 2010-12-20 NOTE — Assessment & Plan Note (Signed)
OFFICE VISIT   ALON, MAZOR  DOB:  1940-12-30                                       03/08/2007  GTXMI#:[id number]   Mr. Swanner is a 70 year old male who is referred for chronic left leg  swelling.  This has been present for approximately 12 weeks.  It is  primarily from the calf down to the foot.  It is worse after standing  for long periods.  He has some burning in the leg when it becomes  swollen and tight.  He has been wearing some mild retail store  compression stockings with minimal relief.  He denies prior trauma to  the leg.  He had a DVT ultrasound performed on 01/07/2007 at  [facility] which showed no evidence of DVT.  He has no prior  history of venous insufficiency in that leg.  He has no significant  family history of this.  He states that for the last 10-15 years, he has  had some mild left ankle swelling at the end of the day, but nothing  that is as severe as this.   PAST MEDICAL HISTORY:  Ulcerative colitis.  He did have an ax injury to  his left inner thigh as a child.  He has no history of kidney or  myocardial insufficiency.  His past medical history is otherwise  remarkable for reflux and asthma.  He previously had left and right  rotator cuff repairs.   MEDICATIONS:  1. Asacol 400 mg.  2. Protonix 40 mg once a day.  3. Lipitor 5 mg once a day.  4. Singulair 10 mg once a day.  5. Foradil 12 mcg once a day.  6. Asmanex 22 mcg once a day.   ALLERGIES:  He thinks he may be allergic to penicillin, but does not  remember his reaction to that.   SOCIAL HISTORY:  He is married, retired.  He is a former smoker, but  quit 30 years ago.  He does not consume alcohol regularly.   REVIEW OF SYSTEMS:  Height 5 feet 10 pounds, weight 182 pounds.  He has  some dyspnea with exertion.  He has an occasional productive cough,  asthma and wheezing.  He occasionally has blood in his stool from his  ulcerative colitis.  He also has  a history of a hiatal hernia.  He also  has multiple joint pains.   PHYSICAL EXAMINATION:  VITAL SIGNS:  Blood pressure 118/75 in the left  arm.  Heart rate 76 and regular.  HEENT:  Unremarkable.  He has 2+ carotid pulses without bruits.  CHEST:  Clear to auscultation.  CARDIAC:  Regular rate and rhythm.  ABDOMEN:  Soft, nontender, no masses.  He has 2+ radial, femoral, dorsalis pedis and posterior tibial pulses  bilaterally.  There is 1-2+ partially pitting edema in the left leg from  the knee down.  There is some squaring of the toes in the left foot and  some mild buffalo hump of the dorsum of the left foot.  He has no  ulcerations on the feet.  There are no obvious varicosities or large  veins in the leg.   In summary, Mr. Brandl has a history of chronic left leg swelling.  His  symptoms are not consistent with arterial or venous obstruction.  I  believe the most likely cause  is primary lymphedema with unknown  etiology.  The best treatment option for this is going to be compression  therapy.  I have also given him a referral to a physical therapist for  massage and compression therapy of the left lower extremity edema.  I  counseled him that leg elevation and wearing a graduated compression  stocking are going to be the mainstay of his therapy.  He was given a  prescription for a graduated compression stocking 15-20 mmHg today.  He  will follow up on an as-needed basis.   Jessy Oto. Fields, MD  Electronically Signed   CEF/MEDQ  D:  03/10/2007  T:  03/11/2007  Job:  [id number]   cc:   Ander Slade. Rockwell Alexandria, M.D.  Sherren Kerns. Pamella Pert, MD

## 2010-12-20 NOTE — Assessment & Plan Note (Signed)
[place]                             PULMONARY OFFICE NOTE   NAME:Rossel, [name]                    MRN:          [id number]  DATE:12/26/2006                            DOB:          1941/07/22    PROBLEMS:  1. Asthma bronchitis.  2. Colitis/hiatal hernia (Dr. Cristina Gong).   HISTORY:  Having a good spring.  He feels his breathing control is fine  with the Foradil and Asmanex 1 puff b.i.d., provided through his  insurance company formulary.  No acute events or special problems.   MEDICATIONS:  1. Asacol 400 mg.  2. Protonix.  3. Lipitor.  4. Singulair.  5. Foradil 1 puff b.i.d.  6. Asmanex 1 puff b.i.d.  7. Rescue albuterol p.r.n., rarely needed.  8. Saline nasal spray.   Drug intolerant to AVELOX with rash.   OBJECTIVE:  VITAL SIGNS:  Weight 182 pounds.  BP 118/70, pulse 74.  Room  air saturation 97%.  LUNGS:  Trace end-expiratory wheeze at the right scapula, otherwise  clear.  NECK:  No adenopathy.  CARDIAC:  Heart sounds normal.  He looks quite comfortable.   IMPRESSION:  Stable asthmatic bronchitis.   PLAN:  I rewrote his prescriptions because of insurance changes.  He has  retired.  Schedule return in six months, earlier p.r.n.     Clinton D. Annamaria Boots, MD, [place], [place]  Electronically Signed    CDY/MedQ  DD: 12/26/2006  DT: 12/27/2006  Job #: [id number]   cc:   Sherren Kerns. Pamella Pert, MD

## 2010-12-23 NOTE — Assessment & Plan Note (Signed)
[place]                               PULMONARY OFFICE NOTE   NAME:[name], [name]                    MRN:          [id number]  DATE:02/21/2006                            DOB:          Dec 20, 1940    PROBLEM:  1.  Asthmatic bronchitis.  2.  Colitis/hiatal hernia (Dr. Cristina [name]).   HISTORY:  About a week ago after work he developed chills and temperature to  102.  He saw Dr. Hulan Fess at his primary office and had a chest x-ray  and urinalysis, apparently with suspicion that he might have a developing  UTI.  He was begun on Cipro which he took for four days, and some Mucinex.  He had found a tick on his head, attached, three or four weeks ago.  He  switched from Cipro to Avelox 400 mg on February 19, 2006.  After his second  tablet, he awoke today with a generalized macular rash primarily on his  abdomen.  He has had some occipital headache, he says he has been sweating  easily, but does not think he has had fever in the last day or two.  He has  been dieting some, trying to get his weight down.  He says on chest x-ray at  Dr. Eddie Dibbles, there is no comparison film, but there was a denser area  apparently raising question of a pneumonia.   MEDICATIONS:  1.  Advair 250/50.  2.  Asacol 400 mg.  3.  Protonix.  4.  Lipitor.  5.  Singular.  6.  Albuterol inhaler.  7.  Saline nasal spray.   OBJECTIVE:  VITAL SIGNS:  Weight 177 pounds, down from 185 pounds in  December.  Temperature 97 degrees orally, blood pressure 118/84, pulse  regular and 97, room air saturation 95%.  GENERAL:  No obvious distress.  Nurse thought he seemed just a little bit  confused to routine questions, but I talked to him quite a bit and did not  think there was any mental status change.  SKIN:  Fine macular rash demonstrated across the abdomen, but not on his  palms.  NECK:  No neck stiffness, no adenopathy.  LUNGS:  A few mild crackles in his chest without increased  work of  breathing.  HEART:  Regular without murmur.   IMPRESSION:  Bronchitis syndrome with fever, rash, recently attached tick.  I this point, I cannot tell if he has had a tick fever, possibly a flare of  colitis, although probably not an urinary infection.   PLAN:  1.  Doxycycline.  2.  Serology for [facility] Spotted Fever.  3.  CBC with differential.  4.  Schedule return in three weeks for followup chest x-ray.                                   Clinton D. Annamaria Boots, MD, FCCP, FACP   CDY/MedQ  DD:  02/22/2006  DT:  02/23/2006  Job #:  [id number]   cc:   Sherren Kerns. Pamella Pert, MD

## 2010-12-23 NOTE — Assessment & Plan Note (Signed)
[place]                               PULMONARY OFFICE NOTE   NAME:[name], [name]                    MRN:          [id number]  DATE:04/26/2006                            DOB:          May 28, 1941    PROBLEM:  1. Asthmatic bronchitis.  2. Colitis/hiatal hernia (Dr. Cristina Gong).   HISTORY:  Approximately 6-week followup.  He says, he is better than last  time and feels that he is well enough just to observe now without  additional therapies but admits still a little congestion in the nose and  chest.  Limited secretions are clear.  There is no fever.   MEDICATIONS:  Advair 250/50, Asacol 400 mg, Protonix, Lipitor, Singulair,  albuterol rescue inhaler, saline nasal spray, Mucinex.   DRUG INTOLERANT:  AVELOX - RASH.   OBJECTIVE:  Weight 177 pounds, BP 118/76, pulse regular 65, room air  saturation 96%.  There is mild nasal congestion, light cough, mild wheeze at  the left base, nothing is labored, speech pattern is normal, he looks quite  comfortable, I find no adenopathy, heart sounds are regular without murmur  or gallop.   CT of the sinuses on March 19, 2006 describes some mucosal thickening in  the right sphenoid and maxillary sinuses with no air fluid levels  interpreted as inflammation.  His chest x-ray in December 2006 had shown  chronic bronchitis and emphysema with no active process then.  CBC from February 21, 2006 had shown a total white count of 8800 with hemoglobin of 15.1.  He  had 26.4% neutrophils with a predominance of monocytes and lymphocytes  supporting impression that he was fighting a viral type of infection at that  point.   IMPRESSION:  1. Rhinitis with residual from what was probably a mild sinusitis.  2. Asthma with bronchitis.   PLAN:  We are going to leave him on his routine meds as listed above with  observation.  I have discussed interventions if he fails to clear or gets  worse.  Schedule return 4 months,  earlier p.r.n.                                  Clinton D. Annamaria Boots, MD, FCCP, FACP   CDY/MedQ  DD:  04/26/2006  DT:  04/27/2006  Job #:  [id number]   cc:   Sherren Kerns. Pamella Pert, MD

## 2010-12-23 NOTE — Procedures (Signed)
[place]  Patient:    [name], [name] Visit Number: [id number] MRN: [id number]          Service Type: END Location: ENDO Attending Physician:  Ernie Avena Dictated by:   Cleotis Nipper, M.D. Proc. Date: 04/29/01 Admit Date:  04/29/2001   CC:         Kirtland Bouchard, M.D.   Procedure Report  PROCEDURE PERFORMED:  Colonoscopy with biopsies.  ENDOSCOPIST:  Cleotis Nipper, M.D.  INDICATIONS FOR PROCEDURE:  The patient is a 70 year old with longstanding ulcerative colitis for dysplasia surveillance.  FINDINGS:  Burned out colitis with minimal erythema at rectosigmoid junction. Random biopsies obtained.  DESCRIPTION OF PROCEDURE:  The nature, purpose and risks of the procedure were familiar to the patient who provided written consent.  Sedation was fentanyl 80 mcg and Versed 8 mg without arrhythmias or desaturation.  The Olympus adult video colonoscope was advanced without difficulty  to the area just above the cecum, applying some external abdominal compression to reach the base of the cecum as identified by clear visualization of the appendiceal orifice and ileocecal valve.  Pull-back was then performed. The quality of the prep was very good and it is felt that all areas were adequately seen.  In the distal rectum, there were two small sessile polyps, each measuring about 3 to 4 mm across, very flat in character, and basically hyperplastic in appearance.  One or both of these was biopsied by cold biopsy technique to the point where it appeared the other one essentially flattened out and did not need biopsies.  No other polyps were seen and there was no evidence of cancer, vascular malformations or diverticulosis. rectum was The main finding on this exam was the absence of any significant colitis activity.  There was just a little bit of sandpaper erythema near the rectosigmoid junction which might even be related to  prep artifact.  There was certainly no significant acute colitis present.  Retroflexion was not performed in the rectum.  The patient tolerated the procedure well and there were no apparent complications.  IMPRESSION:  Basically normal surveillance colonoscopy.  Minimal distal rectal polyps and minimal inflammatory activity as described above.  PLAN:  Await pathology on biopsies with further management to depend on the histologic findings.  Probable follow-up colonoscopy in two years. Dictated by:   Cleotis Nipper, M.D. Attending Physician:  Ernie Avena DD:  04/29/01 TD:  04/29/01 Job: [id number]

## 2010-12-23 NOTE — Assessment & Plan Note (Signed)
[place]                               PULMONARY OFFICE NOTE   NAME:[name], [name]                    MRN:          [id number]  DATE:03/15/2006                            DOB:          1941/01/04    PROBLEM LIST:  1. Asthmatic bronchitis.  2. Colitis/hiatal hernia (Dr. Cristina Gong).   HISTORY:  He was seen July 18 with a bronchitis syndrome including a fever.  Because of recent attached tick, we had sent serology for [facility]  spotted fever, which has returned negative/normal.  He had had a small area  on chest x-ray questioned at his primary physician's office as a possible  early pneumonia.  Because of the rickettsial question, I had given  doxycycline.  He says he is better but still notices head congestion when  he wakes up in the morning, and some chest congestion, cough, and mostly  white or trace yellow sputum.  He is not having headache or fever.   MEDICATIONS:  1. Advair 250/50.  2. Asacol 400 mg.  3. Protonix.  4. Lipitor 10 mg.  5. Singulair.  6. Albuterol inhaler.  7. Saline nasal rinse.  8. Mucinex.   LABORATORY:  White blood count was 8800, with hemoglobin of 15.1 when  checked on July 18.  Differential was abnormal.  The platelet count was  167,000, but he had 26% neutrophils, of which 19 were segs, 4 bands, 16  monos, 2 eosinophils, and some variant lymphocytes.   OBJECTIVE:  .  VITAL SIGNS:  Weight 177 pounds, BP 128/72 , pulse rate was 79, room air  saturation 96%.  CHEST:  Congested cough.  Lung sounds are unremarkable with quiet breathing  until he coughs, and work of breathing is not increased.  CARDIAC:  Heart sounds are regular without murmur or gallop.  HEENT:  There is some clear mucus postnasal drainage.  No adenopathy or  rash.   IMPRESSION:  Improved rhinitis/rhinosinusitis and bronchitis syndrome. I am  not sure if the abnormal white blood cell differential speaks to an atypical  infection or  virus, or if it is something else.  We will recheck it.   PLAN:  1. I was going to ask for a chest x-ray, but he reports he had one at      [facility] about four weeks ago.  We will check that.  2. Sample Veramyst 1 spray each nostril daily.  3. Asmanex 1 inhalation daily with instruction.  4. Schedule limited CT scan of sinuses, looking for extent of sinusitis.  5. Pneumococcal vaccine #1 was discussed and given at his request.  6. Schedule return in 1 month, but earlier p.r.n.                                   Clinton D. Annamaria Boots, MD, FCCP, FACP   CDY/MedQ  DD:  03/15/2006  DT:  03/15/2006  Job #:  [id number]   cc:   Sherren Kerns. Pamella Pert, MD

## 2010-12-23 NOTE — Assessment & Plan Note (Signed)
[place]                             PULMONARY OFFICE NOTE   NAME:[name] [name] HIPPE                    MRN:          [id number]  DATE:08/28/2006                            DOB:          1940-11-23    PROBLEM:  1. Asthmatic bronchitis.  2. Colitis/hiatal hernia (Dr. Cristina Gong).   HISTORY:  He says he has done well in recent months despite the winter  weather with occasional head and chest congestion but no dramatic  events.   MEDICATIONS:  1. Advair 250/50.  2. Asacol 400 mg.  3. Protonix.  4. Lipitor 10 mg.  5. Multivitamin.  6. Singulair.  7. Albuterol inhaler.  8. Saline nasal spray.  9. Mucinex.   DRUG INTOLERANCE:  AVELOX with rash.   OBJECTIVE:  Weight 182 pounds.  BP 104/70, pulse regular 66, room air  saturation 97%.  Bibasilar squeaks, mild congestion, unlabored.  No  dullness, no cough.  Throat is clear.  There is mild nasal congestion.  I do not find adenopathy or edema.   IMPRESSION:  1. Rhinitis.  2. Chronic asthmatic bronchitis/chronic obstructive pulmonary disease.   PLAN:  1. Because of his [facility] rules, we are changing from      Advair to Foradil 1 puff b.i.d. and Asmanex 1 puff b.i.d.  This      change was discussed.  We reviewed his limited CT of the sinuses      done last August which had shown paranasal sinus inflammation with      mucosal thickening but no air fluid levels.  2. Schedule return four months, earlier p.r.n.    Clinton D. Annamaria Boots, MD, [place], [place]  Electronically Signed   CDY/MedQ  DD: 09/01/2006  DT: 09/01/2006  Job #: [id number]   cc:   Sherren Kerns. Pamella Pert, MD

## 2011-03-06 ENCOUNTER — Other Ambulatory Visit: Payer: Self-pay | Admitting: Internal Medicine

## 2011-03-07 ENCOUNTER — Telehealth: Payer: Self-pay | Admitting: Internal Medicine

## 2011-03-07 NOTE — Telephone Encounter (Signed)
Spoke with pt. He wants to discuss switching to the generic singulair, his pharmacist advised that this would be going generic on August 1st. I advised that he is overdue for rov, last seen June 2011- I sched him appt with CDY for 03/08/11 at 9 am and this can be discussed at that time. Pt states nothing further needed.

## 2011-03-08 ENCOUNTER — Encounter: Payer: Self-pay | Admitting: Internal Medicine

## 2011-03-08 ENCOUNTER — Ambulatory Visit (INDEPENDENT_AMBULATORY_CARE_PROVIDER_SITE_OTHER): Payer: Medicare Other | Admitting: Internal Medicine

## 2011-03-08 VITALS — BP 112/66 | HR 69 | Ht 70.5 in | Wt 185.0 lb

## 2011-03-08 DIAGNOSIS — J42 Unspecified chronic bronchitis: Secondary | ICD-10-CM

## 2011-03-08 DIAGNOSIS — J328 Other chronic sinusitis: Secondary | ICD-10-CM

## 2011-03-08 MED ORDER — CEFDINIR 300 MG PO CAPS: 300.0000 mg | ORAL_CAPSULE | Freq: Two times a day (BID) | ORAL | Status: AC

## 2011-03-08 MED ORDER — METHYLPREDNISOLONE ACETATE 80 MG/ML IJ SUSP
80.0000 mg | Freq: Once | INTRAMUSCULAR | Status: AC
  Administered 2011-03-08: 80 mg via INTRAMUSCULAR

## 2011-03-08 NOTE — Assessment & Plan Note (Signed)
Exacerbation of rhinosinusitis- will cover with depo and cefdinir

## 2011-03-08 NOTE — Progress Notes (Signed)
  Subjective:    Patient ID: [name], male    DOB: 04/11/1941, 70 y.o.   MRN: [id number]  HPI 03/08/11- 70 year old male former smoker followed for chronic asthmatic bronchitis and rhinitis with history of chronic sinusitis and colitis. Last here August 16, 2010- CXR 08/16/10- NAD.  CXR 10/17/10 -preop for hernia repair- NAD, mild thoracic spine DGD. Now for past 2-3 weeks has had increased nasal congestion, maybe a little inner ear light headedness on standing, and increased chest congestion with yellow sputum, no fever.  Review of Systems Constitutional:   No-   weight loss, night sweats, fevers, chills, fatigue, lassitude. HEENT:   No-   headaches, difficulty swallowing, tooth/dental problems, sore throat,                   CV:  No-   chest pain, orthopnea, PND, swelling in lower extremities, anasarca, dizziness, palpitations  GI:  No-   heartburn, indigestion, abdominal pain, nausea, vomiting, diarrhea,                 change in bowel habits, loss of appetite  Resp: No- acute   shortness of breath with exertion or at rest.  No-  excess mucus,            No-  coughing up of blood.                + change in color of mucus.  No- wheezing.    Skin: No-   rash or lesions.  GU: No-   dysuria, change in color of urine, no urgency or frequency.  No- flank pain.  MS:  No-   joint pain or swelling.  No- decreased range of motion.  No- back pain.  Psych:  No- change in mood or affect. No depression or anxiety.  No memory loss.      Objective:   Physical Exam General- Alert, Oriented, Affect-appropriate, Distress- none acute Skin- rash-none, lesions- none, excoriation- none Lymphadenopathy- none Head- atraumatic            Eyes- Gross vision intact, PERRLA, conjunctivae clear secretions            Ears- Hearing, canals-+ mild retraction/ no red or fluid            Nose- Clear, No-Septal dev, mucus, polyps, erosion, perforation             Throat- Mallampati II , mucosa clear  ,+ drainage- thick white, tonsils- atrophic Neck- flexible , trachea midline, no stridor , thyroid nl, carotid no bruit Chest - symmetrical excursion , unlabored           Heart/CV- RRR , no murmur , no gallop  , no rub, nl s1 s2                           - JVD- none , edema- none, stasis changes- none, varices- none           Lung- + mild inspiratory wheeze and wet cough , dullness-none, rub- none           Chest wall-  Abd- tender-no, distended-no, bowel sounds-present, HSM- no Br/ Gen/ Rectal- Not done, not indicated Extrem- cyanosis- none, clubbing, none, atrophy- none, strength- nl Neuro- grossly intact to observation         Assessment & Plan:

## 2011-03-08 NOTE — Patient Instructions (Signed)
Script sent for cefdinir antibiotic  Depo 80

## 2011-03-08 NOTE — Assessment & Plan Note (Signed)
Exacerbation of bronchitis - will give cefdinir and depo

## 2011-03-21 ENCOUNTER — Other Ambulatory Visit: Payer: Self-pay | Admitting: Family Medicine

## 2011-03-24 ENCOUNTER — Other Ambulatory Visit: Payer: Self-pay | Admitting: Family Medicine

## 2011-03-24 MED ORDER — TAMSULOSIN HCL 0.4 MG PO CAPS: 0.4000 mg | ORAL_CAPSULE | Freq: Every day | ORAL | Status: DC

## 2011-04-04 ENCOUNTER — Ambulatory Visit (INDEPENDENT_AMBULATORY_CARE_PROVIDER_SITE_OTHER): Payer: Medicare Other | Admitting: Family Medicine

## 2011-04-04 VITALS — BP 110/70 | HR 64 | Temp 97.9°F | Wt 182.0 lb

## 2011-04-04 DIAGNOSIS — IMO0001 Reserved for inherently not codable concepts without codable children: Secondary | ICD-10-CM

## 2011-04-04 DIAGNOSIS — E785 Hyperlipidemia, unspecified: Secondary | ICD-10-CM

## 2011-04-04 DIAGNOSIS — M791 Myalgia, unspecified site: Secondary | ICD-10-CM

## 2011-04-04 LAB — BASIC METABOLIC PANEL
CO2: 26 mEq/L (ref 19–32)
Calcium: 9 mg/dL (ref 8.4–10.5)
Creatinine, Ser: 1 mg/dL (ref 0.4–1.5)
GFR: 80.26 mL/min (ref >60.00)

## 2011-04-04 NOTE — Progress Notes (Signed)
  Subjective:    Patient ID: [name], male    DOB: 07/17/1941, 70 y.o.   MRN: [id number]  HPI Muscle aches- sxs started months ago w/ intermittent pains.  'sharp pains'.  This progressed until pt was having pain below shoulder blade and in low back, 'in what I call the tenderloin muscles'.  Stopped lipitor 3-4 days ago and sxs seem to be improving.  Pt was 'unable to turn over at night it was so bad.  And it would stay w/ me all day'.   Review of Systems For ROS see HPI     Objective:   Physical Exam  Vitals reviewed. Constitutional: He appears well-developed and well-nourished. No distress.  Musculoskeletal: Normal range of motion. He exhibits no edema and no tenderness.          Assessment & Plan:

## 2011-04-04 NOTE — Assessment & Plan Note (Signed)
Stop lipitor due to myalgias.  Check CK.  Will check lipids at upcoming CPE and determine what alternative tx will be most appropriate.

## 2011-04-04 NOTE — Patient Instructions (Signed)
Schedule your complete physical at your convenience- do not eat before this appt We'll notify you of your lab results Make sure you drink plenty of fluids STOP the Lipitor Call with any questions or concerns Happy Labor Day!

## 2011-04-04 NOTE — Assessment & Plan Note (Signed)
Pt feels sxs have improved since stopping lipitor.  Check CK and BMP.  Continue to hold statin.

## 2011-04-05 ENCOUNTER — Telehealth: Payer: Self-pay

## 2011-04-05 NOTE — Telephone Encounter (Signed)
Message copied by Santiago Bumpers on Wed Apr 05, 2011  8:46 AM ------      Message from: Midge Minium      Created: Wed Apr 05, 2011  8:08 AM       Labs are normal- no evidence of muscle breakdown or kidney problem

## 2011-04-05 NOTE — Telephone Encounter (Signed)
Labs mailed

## 2011-05-22 ENCOUNTER — Encounter: Payer: Medicare Other | Admitting: Family Medicine

## 2011-05-29 ENCOUNTER — Ambulatory Visit (INDEPENDENT_AMBULATORY_CARE_PROVIDER_SITE_OTHER): Payer: Medicare Other | Admitting: Family Medicine

## 2011-05-29 ENCOUNTER — Encounter: Payer: Self-pay | Admitting: Family Medicine

## 2011-05-29 DIAGNOSIS — Z23 Encounter for immunization: Secondary | ICD-10-CM

## 2011-05-29 DIAGNOSIS — Z87898 Personal history of other specified conditions: Secondary | ICD-10-CM

## 2011-05-29 DIAGNOSIS — Z0000 Encounter for general adult medical examination without abnormal findings: Secondary | ICD-10-CM | POA: Insufficient documentation

## 2011-05-29 DIAGNOSIS — N400 Enlarged prostate without lower urinary tract symptoms: Secondary | ICD-10-CM

## 2011-05-29 DIAGNOSIS — E785 Hyperlipidemia, unspecified: Secondary | ICD-10-CM

## 2011-05-29 LAB — HEPATIC FUNCTION PANEL
AST: 20 U/L (ref 0–37)
Alkaline Phosphatase: 69 U/L (ref 39–117)
Total Bilirubin: 0.7 mg/dL (ref 0.3–1.2)

## 2011-05-29 LAB — CBC WITH DIFFERENTIAL/PLATELET
Eosinophils Absolute: 0.2 10*3/uL (ref 0.0–0.7)
Eosinophils Relative: 3 % (ref 0.0–5.0)
MCHC: 34.1 g/dL (ref 30.0–36.0)
MCV: 86.4 fl (ref 78.0–100.0)
Monocytes Absolute: 0.6 10*3/uL (ref 0.1–1.0)
Neutrophils Relative %: 68.4 % (ref 43.0–77.0)
Platelets: 321 10*3/uL (ref 150.0–400.0)
WBC: 7.7 10*3/uL (ref 4.5–10.5)

## 2011-05-29 LAB — LIPID PANEL
Total CHOL/HDL Ratio: 6
VLDL: 28 mg/dL (ref 0.0–40.0)

## 2011-05-29 LAB — BASIC METABOLIC PANEL
GFR: 79.29 mL/min (ref >60.00)
Potassium: 4.9 mEq/L (ref 3.5–5.1)
Sodium: 138 mEq/L (ref 135–145)

## 2011-05-29 LAB — LDL CHOLESTEROL, DIRECT: Direct LDL: 156.7 mg/dL

## 2011-05-29 NOTE — Patient Instructions (Signed)
We'll notify you of your lab results Keep up the good work!  You look great! If you continue to have excessive urination at night, call me and we'll set you up with urology Call with any questions or concerns Happy Holidays!!!

## 2011-05-29 NOTE — Progress Notes (Signed)
  Subjective:    Patient ID: [name], male    DOB: Oct 11, 1940, 70 y.o.   MRN: [id number]  HPI Here today for CPE.  Risk Factors: Hyperlipidemia- chronic problem for pt, stopped lipitor due to side effects (sharp pains in extremities). BPH- pt reports that intermittently at night he will have increased urination. Physical Activity: some walking but no regular exercise Fall Risk: low risk, very steady on feet Depression: denies current sxs Hearing: decreased, has hearing aide in L ear ADL's: independent Cognitive: normal linear thought process, memory and attention intact Home Safety: feels safe at home, lives w/ wife Height, Weight, BMI, Visual Acuity: see vitals, vision corrected to 20/20 w/ glasses Counseling: UTD on colonoscopy, EKG.  Has never seen urology.. Labs Ordered: See A&P Care Plan: See A&P    Review of Systems Patient reports no vision/hearing changes, anorexia, fever ,adenopathy, persistant/recurrent hoarseness, swallowing issues, chest pain, palpitations, edema, persistant/recurrent cough, hemoptysis, dyspnea (rest,exertional, paroxysmal nocturnal), gastrointestinal  bleeding (melena, rectal bleeding), abdominal pain, excessive heart burn, GU symptoms (dysuria, hematuria, voiding/incontinence issues) syncope, focal weakness, memory loss, numbness & tingling, skin/hair/nail changes, depression, anxiety, abnormal bruising/bleeding, musculoskeletal symptoms/signs.     Objective:   Physical Exam BP 115/65  Pulse 63  Temp(Src) 97.9 F (36.6 C) (Oral)  Ht 5' 10"  (1.778 m)  Wt 181 lb (82.101 kg)  BMI 25.97 kg/m2  General Appearance:    Alert, cooperative, no distress, appears stated age  Head:    Normocephalic, without obvious abnormality, atraumatic  Eyes:    PERRL, conjunctiva/corneas clear, EOM's intact, fundi    benign, both eyes       Ears:    Normal TM's and external ear canals, both ears  Nose:   Nares normal, septum midline, mucosa normal, no drainage  or sinus tenderness  Throat:   Lips, mucosa, and tongue normal; teeth and gums normal  Neck:   Supple, symmetrical, trachea midline, no adenopathy;       thyroid:  No enlargement/tenderness/nodules  Back:     Symmetric, no curvature, ROM normal, no CVA tenderness  Lungs:     Clear to auscultation bilaterally, respirations unlabored  Chest wall:    No tenderness or deformity  Heart:    Regular rate and rhythm, S1 and S2 normal, no murmur, rub   or gallop  Abdomen:     Soft, non-tender, bowel sounds active all four quadrants,    no masses, no organomegaly  Genitalia:    Normal male without lesion, masses, discharge or tenderness  Rectal:    Normal tone, mildly enlarged prostate w/out nodularity, no masses or tenderness;  Extremities:   Extremities normal, atraumatic, no cyanosis or edema  Pulses:   2+ and symmetric all extremities  Skin:   Skin color, texture, turgor normal, no rashes or lesions  Lymph nodes:   Cervical, supraclavicular, and axillary nodes normal  Neurologic:   CNII-XII intact. Normal strength, sensation and reflexes      throughout          Assessment & Plan:

## 2011-05-31 ENCOUNTER — Telehealth: Payer: Self-pay | Admitting: *Deleted

## 2011-05-31 ENCOUNTER — Encounter: Payer: Self-pay | Admitting: *Deleted

## 2011-05-31 MED ORDER — SIMVASTATIN 20 MG PO TABS: 20.0000 mg | ORAL_TABLET | Freq: Every evening | ORAL | Status: DC

## 2011-05-31 NOTE — Telephone Encounter (Signed)
Pt called, advised of lab results and new medication with directions. Pt understood, advised rx sent and letter of labs mailed

## 2011-06-06 NOTE — Assessment & Plan Note (Addendum)
Pt has never seen urology.  Check PSA.  If pt continues to have increased urination at night will refer to uro.  Pt expressed understanding and is in agreement w/ plan.

## 2011-06-06 NOTE — Assessment & Plan Note (Signed)
Pt's PE WNL w/ exception of BPH.  UTD on health maintenance.  Check labs.  Anticipatory guidance provided.

## 2011-06-06 NOTE — Assessment & Plan Note (Signed)
Chronic problem for pt.  Stopped lipitor due to muscle aches.  Check labs and restart/adjust meds prn.  Pt expressed understanding and is in agreement w/ plan.

## 2011-06-25 ENCOUNTER — Other Ambulatory Visit: Payer: Self-pay | Admitting: Family Medicine

## 2011-06-26 NOTE — Telephone Encounter (Signed)
rx sent to pharmacy by e-script For nasonex

## 2011-08-08 HISTORY — PX: HERNIA REPAIR: SHX51

## 2011-08-18 ENCOUNTER — Other Ambulatory Visit: Payer: Self-pay | Admitting: Family Medicine

## 2011-08-18 DIAGNOSIS — Z0000 Encounter for general adult medical examination without abnormal findings: Secondary | ICD-10-CM

## 2011-08-21 ENCOUNTER — Other Ambulatory Visit (INDEPENDENT_AMBULATORY_CARE_PROVIDER_SITE_OTHER): Payer: Medicare Other

## 2011-08-21 DIAGNOSIS — Z0000 Encounter for general adult medical examination without abnormal findings: Secondary | ICD-10-CM

## 2011-08-21 LAB — HEPATIC FUNCTION PANEL: Total Bilirubin: 0.7 mg/dL (ref 0.3–1.2)

## 2011-08-23 NOTE — Progress Notes (Signed)
Quick Note:  Pt aware and states he will call back to make an appt for lipid blood work. ______

## 2011-09-08 ENCOUNTER — Ambulatory Visit (INDEPENDENT_AMBULATORY_CARE_PROVIDER_SITE_OTHER): Payer: Medicare Other | Admitting: Internal Medicine

## 2011-09-08 ENCOUNTER — Encounter: Payer: Self-pay | Admitting: Internal Medicine

## 2011-09-08 ENCOUNTER — Ambulatory Visit (INDEPENDENT_AMBULATORY_CARE_PROVIDER_SITE_OTHER)
Admission: RE | Admit: 2011-09-08 | Discharge: 2011-09-08 | Disposition: A | Discharge status: Home / Self Care | Payer: Medicare Other | Source: Ambulatory Visit | Attending: Internal Medicine | Admitting: Internal Medicine

## 2011-09-08 DIAGNOSIS — J328 Other chronic sinusitis: Secondary | ICD-10-CM

## 2011-09-08 DIAGNOSIS — J42 Unspecified chronic bronchitis: Secondary | ICD-10-CM

## 2011-09-08 MED ORDER — CLARITHROMYCIN 500 MG PO TABS: ORAL_TABLET | ORAL | Status: AC

## 2011-09-08 NOTE — Progress Notes (Signed)
Patient ID: [name], male    DOB: February 08, 1941, 71 y.o.   MRN: [id number]  HPI 03/08/11- 71 year old male former smoker followed for chronic asthmatic bronchitis and rhinitis with history of chronic sinusitis and colitis. Last here August 16, 2010- CXR 08/16/10- NAD.  CXR 10/17/10 -preop for hernia repair- NAD, mild thoracic spine DGD. Now for past 2-3 weeks has had increased nasal congestion, maybe a little inner ear, light headedness on standing, and increased chest congestion with yellow sputum, no fever.  09/08/11- 71 year old male former smoker followed for chronic asthmatic bronchitis and rhinitis with history of chronic sinusitis and colitis. He did better with Cefdinir after last visit but never really cleared. When lying supine he notices nasal congestion with postnasal drip and cough productive of discolored sputum. Mild shortness of breath with exertion. Denies fever, nodes, headache, chest pain, palpitation.   ROS-see HPI Constitutional:   No-   weight loss, night sweats, fevers, chills, fatigue, lassitude. HEENT:   No-  headaches, difficulty swallowing, tooth/dental problems, sore throat,       No-  sneezing, itching, ear ache,  +nasal congestion, post nasal drip,  CV:  No-   chest pain, orthopnea, PND, swelling in lower extremities, anasarca,  dizziness, palpitations Resp: +  shortness of breath with exertion or at rest.              +  productive cough,  No non-productive cough,  No- coughing up of blood.              No-   change in color of mucus.  No- wheezing.   Skin: No-   rash or lesions. GI:  No-   heartburn, indigestion, abdominal pain, nausea, vomiting, diarrhea,                 change in bowel habits, loss of appetite GU: No-   dysuria,  MS:  No-   joint pain or swelling.  No- decreased range of motion.  No- back pain. Neuro-     nothing unusual Psych:  No- change in mood or affect. No depression or anxiety.  No memory loss.       Objective:   Physical  Exam General- Alert, Oriented, Affect-appropriate, Distress- none acute Skin- rash-none, lesions- none, excoriation- none Lymphadenopathy- none Head- atraumatic            Eyes- Gross vision intact, PERRLA, conjunctivae clear secretions            Ears- Hearing, canals- normal, no red or fluid            Nose- Clear, No-Septal dev, mucus, polyps, erosion, perforation             Throat- Mallampati II , mucosa clear ,+ drainage- thick white, tonsils- atrophic Neck- flexible , trachea midline, no stridor , thyroid nl, carotid no bruit Chest - symmetrical excursion , unlabored           Heart/CV- RRR , no murmur , no gallop  , no rub, nl s1 s2                           - JVD- none , edema- none, stasis changes- none, varices- none           Lung- minor crackles, unlabored , dullness-none, rub- none           Chest wall-  Abd-  Br/ Gen/ Rectal- Not done, not indicated  Extrem- cyanosis- none, clubbing, none, atrophy- none, strength- nl Neuro- grossly intact to observation

## 2011-09-08 NOTE — Patient Instructions (Signed)
Script sent for Biaxin-    Skip your Zocor/ simvastatin while on Biaxin  Try taking a decongestant like sudafed 30 or 60 mg once or twice daily when needed to improve drainage. Watch out for urinary retention with this.  Continue Mucinex- you might try taking it twice daily for awhile  Order- CXR- dx chronic bronchitis

## 2011-09-12 NOTE — Assessment & Plan Note (Signed)
He was better for a while after last antibiotics. Plan-Biaxin, chest x-ray

## 2011-09-12 NOTE — Assessment & Plan Note (Signed)
Relapse of his chronic rhinitis/sinusitis pattern. Plan-Sudafed as tolerated, Biaxin, consider CT of sinuses

## 2011-10-02 ENCOUNTER — Telehealth: Payer: Self-pay | Admitting: Family Medicine

## 2011-10-02 MED ORDER — MOMETASONE FUROATE 50 MCG/ACT NA SUSP: 2.0000 | Freq: Every day | NASAL | Status: DC

## 2011-10-02 NOTE — Telephone Encounter (Signed)
Refill: Nasonex 50 mcg nasal spray. Use 2 sprays in each nostril once daily. Qty 51.0 GM. Last fill 12-04-10

## 2011-10-02 NOTE — Telephone Encounter (Signed)
rx sent to pharmacy by e-script  

## 2011-10-03 ENCOUNTER — Telehealth: Payer: Self-pay | Admitting: Family Medicine

## 2011-10-03 ENCOUNTER — Other Ambulatory Visit: Payer: Self-pay

## 2011-10-03 MED ORDER — MOMETASONE FUROATE 50 MCG/ACT NA SUSP: 2.0000 | Freq: Every day | NASAL | Status: DC

## 2011-10-03 NOTE — Telephone Encounter (Signed)
Pharmacy requesting 90 day supply on Nasonex 50 mcg

## 2011-10-03 NOTE — Telephone Encounter (Signed)
Patient called stating that he had his prescription refilled for only 30 days and he needs a 90 day refill.  He said it is too expensive. He was just seen for a physical in October.  I went ahead and resent script 90 days with 1 refill.

## 2011-10-03 NOTE — Telephone Encounter (Signed)
rx sent to pharmacy by e-script  

## 2011-10-06 ENCOUNTER — Encounter: Payer: Self-pay | Admitting: Internal Medicine

## 2011-10-06 ENCOUNTER — Ambulatory Visit (INDEPENDENT_AMBULATORY_CARE_PROVIDER_SITE_OTHER): Payer: Medicare Other | Admitting: Internal Medicine

## 2011-10-06 VITALS — BP 118/64 | HR 66 | Ht 70.5 in | Wt 193.0 lb

## 2011-10-06 DIAGNOSIS — J309 Allergic rhinitis, unspecified: Secondary | ICD-10-CM

## 2011-10-06 DIAGNOSIS — J42 Unspecified chronic bronchitis: Secondary | ICD-10-CM

## 2011-10-06 MED ORDER — ROFLUMILAST 500 MCG PO TABS: 500.0000 ug | ORAL_TABLET | Freq: Every day | ORAL | Status: DC

## 2011-10-06 NOTE — Patient Instructions (Signed)
Sample script Daliresp to suppress bronchitis-      Start with 1/2 tab every other day. After a couple of weeks, if tolerating well, try moving to 1/2 daily , then to 1 daily as able.

## 2011-10-06 NOTE — Progress Notes (Signed)
Patient ID: [name], male    DOB: 1940-12-31, 71 y.o.   MRN: [id number]  HPI 03/08/11- 71 year old male former smoker followed for chronic asthmatic bronchitis and rhinitis with history of chronic sinusitis and colitis. Last here August 16, 2010- CXR 08/16/10- NAD.  CXR 10/17/10 -preop for hernia repair- NAD, mild thoracic spine DGD. Now for past 2-3 weeks has had increased nasal congestion, maybe a little inner ear, light headedness on standing, and increased chest congestion with yellow sputum, no fever.  09/08/11- 71 year old male former smoker followed for chronic asthmatic bronchitis and rhinitis with history of chronic sinusitis and colitis. He did better with Cefdinir after last visit but never really cleared. When lying supine he notices nasal congestion with postnasal drip and cough productive of discolored sputum. Mild shortness of breath with exertion. Denies fever, nodes, headache, chest pain, palpitation.  10/06/11- 71 year old male former smoker followed for chronic asthmatic bronchitis and rhinitis with history of chronic sinusitis and colitis. He feels that he is back to baseline now. Some cough with occasional clear mucus but much better. Nasonex helps his nasal congestion and drainage.    ROS-see HPI Constitutional:   No-   weight loss, night sweats, fevers, chills, fatigue, lassitude. HEENT:   No-  headaches, difficulty swallowing, tooth/dental problems, sore throat,       No-  sneezing, itching, ear ache,  +nasal congestion, post nasal drip,  CV:  No-   chest pain, orthopnea, PND, swelling in lower extremities, anasarca,  dizziness, palpitations Resp: +  shortness of breath with exertion or at rest.              +  productive cough,  No non-productive cough,  No- coughing up of blood.              No-   change in color of mucus.  No- wheezing.   Skin: No-   rash or lesions. GI:  No-   heartburn, indigestion, abdominal pain, nausea, vomiting, diarrhea,    change in bowel habits, loss of appetite GU: No-   dysuria,  MS:  No-   joint pain or swelling.  Neuro-     nothing unusual Psych:  No- change in mood or affect. No depression or anxiety.  No memory loss.       Objective:   Physical Exam General- Alert, Oriented, Affect-appropriate, Distress- none acute Skin- rash-none, lesions- none, excoriation- none Lymphadenopathy- none Head- atraumatic            Eyes- Gross vision intact, PERRLA, conjunctivae clear secretions            Ears- Hearing, canals- normal, no red or fluid            Nose- Clear, No-Septal dev, mucus, polyps, erosion, perforation             Throat- Mallampati II , mucosa clear ,+ drainage- thick white, tonsils- atrophic Neck- flexible , trachea midline, no stridor , thyroid nl, carotid no bruit Chest - symmetrical excursion , unlabored           Heart/CV- RRR , no murmur , no gallop  , no rub, nl s1 s2                           - JVD- none , edema- none, stasis changes- none, varices- none           Lung- minor crackles, unlabored , dullness-none, rub-  none, raspy laugh           Chest wall-  Abd-  Br/ Gen/ Rectal- Not done, not indicated Extrem- cyanosis- none, clubbing, none, atrophy- none, strength- nl Neuro- grossly intact to observation

## 2011-10-11 NOTE — Assessment & Plan Note (Signed)
Currently under much better control. We will be watching with interest is the spring pollen season comes in. He continues Nasonex.

## 2011-10-11 NOTE — Assessment & Plan Note (Signed)
When last tested, PFTs indicated mild obstructive airways disease without response to bronchodilator. This was mostly a bronchitis, consistent with a normal diffusion capacity. He is now nearly back to normal after an acute bronchitic exacerbation. We discussed a trial of Daliresp in the hopes of reducing exacerbations like this in the future. The medicine and its tendency towards GI upset were discussed carefully.

## 2011-10-16 ENCOUNTER — Telehealth: Payer: Self-pay | Admitting: Family Medicine

## 2011-10-16 MED ORDER — TAMSULOSIN HCL 0.4 MG PO CAPS: 0.4000 mg | ORAL_CAPSULE | Freq: Every day | ORAL | Status: DC

## 2011-10-16 NOTE — Telephone Encounter (Signed)
rx sent to pharmacy by e-script  

## 2011-10-16 NOTE — Telephone Encounter (Signed)
Tamsulosin HCl (Cap)  0.4 MG Take 1 capsule (0.4 mg total) by mouth daily.  QTY 90  Last fill date 07/17/11

## 2011-10-20 ENCOUNTER — Other Ambulatory Visit: Payer: Self-pay | Admitting: Gastroenterology

## 2011-10-30 ENCOUNTER — Other Ambulatory Visit: Payer: Self-pay | Admitting: Internal Medicine

## 2011-11-06 ENCOUNTER — Telehealth: Payer: Self-pay | Admitting: Family Medicine

## 2011-11-06 MED ORDER — DIPHENOXYLATE-ATROPINE 2.5-0.025 MG PO TABS: 2.0000 | ORAL_TABLET | Freq: Four times a day (QID) | ORAL | Status: AC | PRN

## 2011-11-06 NOTE — Telephone Encounter (Signed)
Pt is 71 yrs old- we need to worry about dehydration.  Is he having blood in his stool?  If he is having food poisoning (blood in stool) we do not want to stop the flow w/ med.  If there is no blood in stool we can call in Lomotil 2 tabs every 6 hrs as needed, #20 no refills.

## 2011-11-06 NOTE — Telephone Encounter (Signed)
Caller: C[place]ay/Patient; PCP: Midge Minium.; CB#: [phone]; ; ; Call regarding Diarrhea; Watery diarrhea onset 11/05/11. Temp 99.4. Pt had loose stools x 40 on 11/05/11, 10 loose stools today. Imodium AD x 3 today. Last void at 1500 dark yellow urine. Voiding sm amts q 2 hrs. See in 24 hrs per Diarrhea Protocol. Pt requests an rx for diarrhea. PLS CALL PER PT REQUEST. [facility]

## 2011-11-06 NOTE — Telephone Encounter (Signed)
Called pt and gave instructions/new medication, pt understood, new RX sent to [facility] per pt request, pt stated he will increase his fluid intake as directed

## 2011-11-09 ENCOUNTER — Other Ambulatory Visit: Payer: Self-pay | Admitting: Internal Medicine

## 2011-11-09 MED ORDER — FLUTICASONE-SALMETEROL 100-50 MCG/DOSE IN AEPB: 1.0000 | INHALATION_SPRAY | Freq: Two times a day (BID) | RESPIRATORY_TRACT | Status: DC

## 2012-01-29 ENCOUNTER — Other Ambulatory Visit: Payer: Self-pay | Admitting: Internal Medicine

## 2012-03-13 ENCOUNTER — Encounter: Payer: Self-pay | Admitting: Family Medicine

## 2012-03-13 ENCOUNTER — Ambulatory Visit (INDEPENDENT_AMBULATORY_CARE_PROVIDER_SITE_OTHER): Payer: Medicare Other | Admitting: Family Medicine

## 2012-03-13 VITALS — BP 124/74 | HR 67 | Temp 97.6°F | Ht 70.0 in | Wt 184.4 lb

## 2012-03-13 DIAGNOSIS — R350 Frequency of micturition: Secondary | ICD-10-CM | POA: Insufficient documentation

## 2012-03-13 LAB — POCT URINALYSIS DIPSTICK
Bilirubin, UA: NEGATIVE
Ketones, UA: NEGATIVE

## 2012-03-13 MED ORDER — CEPHALEXIN 500 MG PO CAPS: 500.0000 mg | ORAL_CAPSULE | Freq: Two times a day (BID) | ORAL | Status: AC

## 2012-03-13 NOTE — Patient Instructions (Addendum)
Schedule your complete physical for October We'll call you with your urology appt Start the Keflex twice daily for presumed urinary tract infection Drink plenty of fluids Call with any questions or concerns Hang in there!!!

## 2012-03-13 NOTE — Assessment & Plan Note (Signed)
New.  Pt's UA consistent w/ infxn but ongoing sxs are not typical of infxn.  No sxs of prostatitis.  Will start abx for presumed infxn.  Refer to uro for complete evaluation.

## 2012-03-13 NOTE — Progress Notes (Signed)
  Subjective:    Patient ID: [name], male    DOB: [birth year], 71 y.o.   MRN: [id number]  HPI Urinary frequency- pt reports this is an ongoing issue.  Will typically get up 2x/night but will ~1x/week find himself up every hour to urinate.  Has burning w/ initiation of urination.  Denies hesitancy.  + weak stream.  On Flomax daily.  Has never seen uro.  No back pain or fevers.  No pain w/ BMs or w/ sitting.   Review of Systems For ROS see HPI     Objective:   Physical Exam  Vitals reviewed. Constitutional: He appears well-developed and well-nourished. No distress.  Abdominal: Soft. Bowel sounds are normal. He exhibits no distension. There is no tenderness (no suprapubic or CVA tenderness).          Assessment & Plan:

## 2012-03-15 LAB — CULTURE, URINE COMPREHENSIVE: Colony Count: NO GROWTH

## 2012-04-12 ENCOUNTER — Ambulatory Visit: Payer: Medicare Other | Admitting: Internal Medicine

## 2012-04-22 ENCOUNTER — Ambulatory Visit (INDEPENDENT_AMBULATORY_CARE_PROVIDER_SITE_OTHER): Payer: Medicare Other | Admitting: Internal Medicine

## 2012-04-22 ENCOUNTER — Encounter: Payer: Self-pay | Admitting: Internal Medicine

## 2012-04-22 VITALS — BP 108/60 | HR 58 | Ht 70.5 in | Wt 185.8 lb

## 2012-04-22 DIAGNOSIS — Z23 Encounter for immunization: Secondary | ICD-10-CM

## 2012-04-22 DIAGNOSIS — J42 Unspecified chronic bronchitis: Secondary | ICD-10-CM

## 2012-04-22 NOTE — Patient Instructions (Addendum)
Continue present meds  Please call as needed  Flu vax

## 2012-04-22 NOTE — Progress Notes (Signed)
Patient ID: [name], male    DOB: 09-Jul-1941, 71 y.o.   MRN: [id number]  HPI 03/08/11- 71 year old male former smoker followed for chronic asthmatic bronchitis and rhinitis with history of chronic sinusitis and colitis. Last here August 16, 2010- CXR 08/16/10- NAD.  CXR 10/17/10 -preop for hernia repair- NAD, mild thoracic spine DGD. Now for past 2-3 weeks has had increased nasal congestion, maybe a little inner ear, light headedness on standing, and increased chest congestion with yellow sputum, no fever.  09/08/11- 71 year old male former smoker followed for chronic asthmatic bronchitis and rhinitis with history of chronic sinusitis and colitis. He did better with Cefdinir after last visit but never really cleared. When lying supine he notices nasal congestion with postnasal drip and cough productive of discolored sputum. Mild shortness of breath with exertion. Denies fever, nodes, headache, chest pain, palpitation.  10/06/11- 71 year old male former smoker followed for chronic asthmatic bronchitis and rhinitis with history of chronic sinusitis and colitis. He feels that he is back to baseline now. Some cough with occasional clear mucus but much better. Nasonex helps his nasal congestion and drainage.  04/22/12- 71 year old male former smoker followed for chronic asthmatic bronchitis and rhinitis with history of chronic sinusitis and colitis. Denies any new flare ups of wheezing or SOB; breathing about the same as last visit. Had a good summer despite the rain. Could not afford Daliresp.  ROS-see HPI Constitutional:   No-   weight loss, night sweats, fevers, chills, fatigue, lassitude. HEENT:   No-  headaches, difficulty swallowing, tooth/dental problems, sore throat,       No-  sneezing, itching, ear ache,  +nasal congestion, post nasal drip,  CV:  No-   chest pain, orthopnea, PND, swelling in lower extremities, anasarca,  dizziness, palpitations Resp: +  shortness of breath with  exertion or at rest.              +  productive cough,  No non-productive cough,  No- coughing up of blood.              No-   change in color of mucus.  No- wheezing.   Skin: No-   rash or lesions. GI:  No-   heartburn, indigestion, abdominal pain, nausea, vomiting,  GU:  MS:  No-   joint pain or swelling.  Neuro-     nothing unusual Psych:  No- change in mood or affect. No depression or anxiety.  No memory loss.    Objective:   Physical Exam General- Alert, Oriented, Affect-appropriate, Distress- none acute, trim Skin- rash-none, lesions- none, excoriation- none Lymphadenopathy- none Head- atraumatic            Eyes- Gross vision intact, PERRLA, conjunctivae clear secretions            Ears- Hearing, canals- normal, no red or fluid            Nose- Clear, No-Septal dev, mucus, polyps, erosion, perforation             Throat- Mallampati II , mucosa clear ,+ drainage- thick white, tonsils- atrophic Neck- flexible , trachea midline, no stridor , thyroid nl, carotid no bruit Chest - symmetrical excursion , unlabored           Heart/CV- RRR , no murmur , no gallop  , no rub, nl s1 s2                           -  JVD- none , edema- none, stasis changes- none, varices- none           Lung- + few rattles, unlabored , dullness-none, rub- none,           Chest wall-  Abd-  Br/ Gen/ Rectal- Not done, not indicated Extrem- cyanosis- none, clubbing, none, atrophy- none, strength- nl Neuro- grossly intact to observation

## 2012-04-29 NOTE — Assessment & Plan Note (Signed)
Good control. Daliresp was unaffordable. Plan-flu vaccine

## 2012-05-29 ENCOUNTER — Other Ambulatory Visit: Payer: Self-pay | Admitting: Internal Medicine

## 2012-05-30 ENCOUNTER — Other Ambulatory Visit: Payer: Self-pay

## 2012-05-30 ENCOUNTER — Encounter: Payer: Self-pay | Admitting: Family Medicine

## 2012-05-30 ENCOUNTER — Ambulatory Visit (INDEPENDENT_AMBULATORY_CARE_PROVIDER_SITE_OTHER): Payer: Medicare Other | Admitting: Family Medicine

## 2012-05-30 VITALS — BP 110/75 | HR 65 | Temp 97.7°F | Ht 69.0 in | Wt 180.0 lb

## 2012-05-30 DIAGNOSIS — I739 Peripheral vascular disease, unspecified: Secondary | ICD-10-CM

## 2012-05-30 DIAGNOSIS — I70219 Atherosclerosis of native arteries of extremities with intermittent claudication, unspecified extremity: Secondary | ICD-10-CM

## 2012-05-30 DIAGNOSIS — E785 Hyperlipidemia, unspecified: Secondary | ICD-10-CM

## 2012-05-30 DIAGNOSIS — Z0000 Encounter for general adult medical examination without abnormal findings: Secondary | ICD-10-CM

## 2012-05-30 LAB — CBC WITH DIFFERENTIAL/PLATELET
Basophils Absolute: 0.1 10*3/uL (ref 0.0–0.1)
Basophils Relative: 1 % (ref 0.0–3.0)
Eosinophils Absolute: 0.3 10*3/uL (ref 0.0–0.7)
Lymphocytes Relative: 24.8 % (ref 12.0–46.0)
MCHC: 32.5 g/dL (ref 30.0–36.0)
Neutrophils Relative %: 63.6 % (ref 43.0–77.0)
RBC: 4.85 Mil/uL (ref 4.22–5.81)

## 2012-05-30 MED ORDER — ASPIRIN EC 81 MG PO TBEC: 81.0000 mg | DELAYED_RELEASE_TABLET | Freq: Every day | ORAL | Status: DC

## 2012-05-30 NOTE — Progress Notes (Signed)
  Subjective:    Patient ID: [name], male    DOB: 1940-11-05, 71 y.o.   MRN: [id number]  HPI Here today for CPE.  Risk Factors: Hyperlipidemia- chronic problem, on Zocor daily.  No abd pain, N/V, myalgias. Claudication- pt was walking regularly and a few months ago noticed that after the 1st mile 'my legs would get heavy and feel like lead'.  Improved w/ resting.  sxs were particularly noticeable w/ any incline.  Improved w/ flat land. Physical Activity: not currently exercising due to plantar fasciitis Fall Risk: low Depression: no current sxs Hearing: wearing hearing aides ADL's: independent Cognitive: normal linear thought process, memory and attention intact Home Safety: safe at home Height, Weight, BMI, Visual Acuity: see vitals, vision corrected to 20/20 w/ glasses Counseling: UTD on colonoscopy, seeing urology. Labs Ordered: See A&P Care Plan: See A&P    Review of Systems Patient reports no vision/hearing changes, anorexia, fever ,adenopathy, persistant/recurrent hoarseness, swallowing issues, chest pain, palpitations, edema, persistant/recurrent cough, hemoptysis, dyspnea (rest,exertional, paroxysmal nocturnal), gastrointestinal  bleeding (melena, rectal bleeding), abdominal pain, excessive heart burn, GU symptoms (dysuria, hematuria, voiding/incontinence issues) syncope, focal weakness, memory loss, numbness & tingling, skin/hair/nail changes, depression, anxiety, abnormal bruising/bleeding, musculoskeletal symptoms/signs.     Objective:   Physical Exam BP 110/75  Pulse 65  Temp 97.7 F (36.5 C) (Oral)  Ht 5' 9"  (1.753 m)  Wt 180 lb (81.647 kg)  BMI 26.58 kg/m2  SpO2 96%  General Appearance:    Alert, cooperative, no distress, appears stated age  Head:    Normocephalic, without obvious abnormality, atraumatic  Eyes:    PERRL, conjunctiva/corneas clear, EOM's intact, fundi    benign, both eyes       Ears:    Normal TM's and external ear canals, both ears    Nose:   Nares normal, septum midline, mucosa normal, no drainage   or sinus tenderness  Throat:   Lips, mucosa, and tongue normal; teeth and gums normal  Neck:   Supple, symmetrical, trachea midline, no adenopathy;       thyroid:  No enlargement/tenderness/nodules  Back:     Symmetric, no curvature, ROM normal, no CVA tenderness  Lungs:     Clear to auscultation bilaterally, respirations unlabored  Chest wall:    No tenderness or deformity  Heart:    Regular rate and rhythm, S1 and S2 normal, no murmur, rub   or gallop  Abdomen:     Soft, non-tender, bowel sounds active all four quadrants,    no masses, no organomegaly  Genitalia:    Deferred to urology  Rectal:    Extremities:   Extremities normal, atraumatic, no cyanosis or edema  Pulses:   2+ and symmetric all extremities  Skin:   Skin color, texture, turgor normal, no rashes or lesions  Lymph nodes:   Cervical, supraclavicular, and axillary nodes normal  Neurologic:   CNII-XII intact. Normal strength, sensation and reflexes      throughout          Assessment & Plan:

## 2012-05-30 NOTE — Patient Instructions (Addendum)
Follow up in 6 months to recheck cholesterol We'll notify you of your lab results and make any changes if needed Keep up the good work- you look great! We'll notify you of your vascular appt Start enteric coated aspirin 81 mg daily Call with any questions or concerns Happy Fall!!!

## 2012-05-31 LAB — HEPATIC FUNCTION PANEL
Alkaline Phosphatase: 59 U/L (ref 39–117)
Bilirubin, Direct: 0.1 mg/dL (ref 0.0–0.3)
Total Bilirubin: 0.7 mg/dL (ref 0.3–1.2)
Total Protein: 8.2 g/dL (ref 6.0–8.3)

## 2012-05-31 LAB — BASIC METABOLIC PANEL
BUN: 20 mg/dL (ref 6–23)
CO2: 22 mEq/L (ref 19–32)
Chloride: 107 mEq/L (ref 96–112)
Glucose, Bld: 85 mg/dL (ref 70–99)
Potassium: 3.6 mEq/L (ref 3.5–5.1)

## 2012-06-09 NOTE — Assessment & Plan Note (Signed)
New.  Pt's sxs consistent w/ claudication.  Start ASA and refer to vascular for complete w/u.  Reviewed supportive care and red flags that should prompt return.  Pt expressed understanding and is in agreement w/ plan.

## 2012-06-09 NOTE — Assessment & Plan Note (Signed)
Pt's PE WNL.  UTD on health maintenance.  Check labs.  Anticipatory guidance provided.

## 2012-06-09 NOTE — Assessment & Plan Note (Signed)
Chronic problem.  Tolerating meds w/out difficulty.  Check labs.  Adjust meds prn

## 2012-06-17 ENCOUNTER — Encounter: Payer: Self-pay | Admitting: Vascular Surgery

## 2012-06-18 ENCOUNTER — Ambulatory Visit (INDEPENDENT_AMBULATORY_CARE_PROVIDER_SITE_OTHER): Payer: Medicare Other | Admitting: *Deleted

## 2012-06-18 ENCOUNTER — Encounter: Payer: Self-pay | Admitting: Vascular Surgery

## 2012-06-18 ENCOUNTER — Ambulatory Visit (INDEPENDENT_AMBULATORY_CARE_PROVIDER_SITE_OTHER): Payer: Medicare Other | Admitting: Vascular Surgery

## 2012-06-18 VITALS — BP 131/89 | HR 65 | Ht 69.0 in | Wt 184.0 lb

## 2012-06-18 DIAGNOSIS — M79609 Pain in unspecified limb: Secondary | ICD-10-CM

## 2012-06-18 DIAGNOSIS — I739 Peripheral vascular disease, unspecified: Secondary | ICD-10-CM

## 2012-06-18 DIAGNOSIS — I70219 Atherosclerosis of native arteries of extremities with intermittent claudication, unspecified extremity: Secondary | ICD-10-CM | POA: Insufficient documentation

## 2012-06-18 NOTE — Progress Notes (Signed)
Subjective:     Patient ID: [name], male   DOB: 1941/06/07, 71 y.o.   MRN: [id number]  HPI this 71 year old male was referred by Dr. Birdie Riddle for evaluation of possible lower extremity vascular occlusive disease. The patient complains of heaviness in his posterior calf and thigh areas with walking. Eventually he must stop walking. Recently his limitation has been plantar fasciitis in the right foot which has been limiting him to walking very short distances. He has no rest pain or history of nonhealing ulcers, infection, cellulitis, or gangrene. He was previously seen in our office for lymphedema in the left leg by Dr. Juanda [place] in 2008.  Past Medical History  Diagnosis Date  . Ulcerative colitis   . Asthma   . Chronic bronchitis   . Sinusitis   . Peripheral edema   . Hearing loss in left ear   . COPD (chronic obstructive pulmonary disease)     History  Substance Use Topics  . Smoking status: Former Smoker -- 15 years    Types: Cigarettes    Quit date: 08/08/1971  . Smokeless tobacco: Former Systems developer    Quit date: 08/08/1971  . Alcohol Use: No    Family History  Problem Relation Age of Onset  . Heart disease Mother     MI  . Hyperlipidemia Mother   . Other Mother     varicose veins  . Heart attack Mother   . COPD Sister   . Hyperlipidemia Father   . Hypertension Father     Allergies  Allergen Reactions  . Lipitor (Atorvastatin Calcium)     Muscle ache  . Moxifloxacin     REACTION: rash    Current outpatient prescriptions:acetaminophen (TYLENOL) 500 MG tablet, Take 500 mg by mouth. Take as directed prn , Disp: , Rfl: ;  ADVAIR DISKUS 100-50 MCG/DOSE AEPB, INHALE 1 PUFF TWICE DAILY AND RINSE MOUTH AFTER, Disp: 1 each, Rfl: 3;  albuterol (PROVENTIL HFA) 108 (90 BASE) MCG/ACT inhaler, Inhale 2 puffs into the lungs 4 (four) times daily. As needed , Disp: , Rfl:  aspirin EC 81 MG tablet, Take 1 tablet (81 mg total) by mouth daily., Disp: 30 tablet, Rfl: 6;   guaiFENesin (MUCINEX) 600 MG 12 hr tablet, Take 600 mg by mouth daily. As needed, Disp: , Rfl: ;  loratadine (CLARITIN) 10 MG tablet, Take 10 mg by mouth daily.  , Disp: , Rfl: ;  Mesalamine (ASACOL HD) 800 MG TBEC, Take by mouth. 2 in am, 2 at lunch, and 2 in pm , Disp: , Rfl:  mometasone (NASONEX) 50 MCG/ACT nasal spray, Place 2 sprays into the nose daily., Disp: 51 g, Rfl: 1;  montelukast (SINGULAIR) 10 MG tablet, TAKE 1 TABLET BY MOUTH EVERY DAY, Disp: 90 tablet, Rfl: 4;  Multiple Vitamin (MULTIVITAMINS PO), Take by mouth. Take once daily , Disp: , Rfl: ;  pantoprazole (PROTONIX) 40 MG tablet, Take 40 mg by mouth daily.  , Disp: , Rfl:  Saline (AFRIN SALINE NASAL MIST) 0.65 % SOLN, by Nasal route. As needed , Disp: , Rfl: ;  simvastatin (ZOCOR) 20 MG tablet, Take 1 tablet (20 mg total) by mouth every evening., Disp: 90 tablet, Rfl: 3;  Tamsulosin HCl (FLOMAX) 0.4 MG CAPS, Take 1 capsule (0.4 mg total) by mouth daily., Disp: 90 capsule, Rfl: 1  BP 131/89  Pulse 65  Ht 5' 9"  (1.753 m)  Wt 184 lb (83.462 kg)  BMI 27.17 kg/m2  SpO2 99%  Body mass index  is 27.17 kg/(m^2).           Review of Systems patient complains of dyspnea on exertion, swelling in lower extremities left worse than right, productive cough, asthma, wheezing, numbness in arms and legs, and skin rashes. Other systems negative and complete review of systems    Objective:   Physical Exam blood pressure 131/89 heart rate 65 respirations 16 Gen.-alert and oriented x3 in no apparent distress HEENT normal for age Lungs no rhonchi or wheezing Cardiovascular regular rhythm no murmurs carotid pulses 3+ palpable no bruits audible Abdomen soft nontender no palpable masses Musculoskeletal free of  major deformities Skin clear -no rashes Neurologic normal Lower extremities 3+ femoral and dorsalis pedis pulses palpable bilaterally with 1+ edema left equal right 3 post posterior tibial pulses also palpable bilaterally.  Today I  ordered lower extremity arterial study bilaterally. ABIs are normal bilaterally with triphasic flow and no evidence of significant occlusive disease      Assessment:     Leg pain with ambulation-no evidence of peripheral vascular occlusive disease Plantar fasciitis right foot    Plan:     No evidence of arterial insufficiency-no need for further vascular workup If patient continues to have pain in both legs after fasciitis has resolved consider lumbar spine evaluation-could represent symptoms from spinal stenosis

## 2012-08-05 ENCOUNTER — Telehealth: Payer: Self-pay | Admitting: Family Medicine

## 2012-08-05 NOTE — Telephone Encounter (Signed)
Refill: Simvastatin 20 mg tablet. Take 1 tablet by mouth every evening. Qty 90. Last fill 03-03-12

## 2012-08-06 ENCOUNTER — Telehealth: Payer: Self-pay | Admitting: *Deleted

## 2012-08-06 NOTE — Telephone Encounter (Signed)
Error//AB/CMA

## 2012-08-06 NOTE — Telephone Encounter (Addendum)
Spoke on with the pt and 08-05-12, and asked him if he was still taking the Simvastatin?  Pt stated that he was taking Simvastatin and Crestor.  Called pt back and informed him after looking in his chart he is to be taking both Zocor and Zetia, because he couldn't tolerate stronger statin but wasn't at goal on Zocor.  Pt stated that he gave me the wrong information.  He is to be taking Zocor and Zetia, and not Crestor and Zocor.  He stated that the Zetia is $100.00 and he can't afford that, so he wants to know if he could just take the Zocor and watch diet and exercise.//AB/CMA

## 2012-08-08 MED ORDER — SIMVASTATIN 20 MG PO TABS: 20.0000 mg | ORAL_TABLET | Freq: Every evening | ORAL | Status: DC

## 2012-08-08 NOTE — Telephone Encounter (Signed)
Yes- we can try just the simvastatin due to cost and recheck it at upcoming visit.

## 2012-08-08 NOTE — Telephone Encounter (Signed)
Discuss with patient, Rx sent

## 2012-11-03 ENCOUNTER — Other Ambulatory Visit: Payer: Self-pay | Admitting: Family Medicine

## 2012-11-28 ENCOUNTER — Other Ambulatory Visit (INDEPENDENT_AMBULATORY_CARE_PROVIDER_SITE_OTHER): Payer: Medicare Other

## 2012-11-28 DIAGNOSIS — E7800 Pure hypercholesterolemia, unspecified: Secondary | ICD-10-CM

## 2012-11-28 LAB — LIPID PANEL
HDL: 31.9 mg/dL — ABNORMAL LOW (ref >39.00)
LDL Cholesterol: 84 mg/dL (ref 0–99)
Total CHOL/HDL Ratio: 4
VLDL: 25.6 mg/dL (ref 0.0–40.0)

## 2012-11-29 ENCOUNTER — Encounter: Payer: Self-pay | Admitting: General Practice

## 2012-12-04 ENCOUNTER — Other Ambulatory Visit: Payer: Self-pay | Admitting: Gastroenterology

## 2013-01-05 ENCOUNTER — Other Ambulatory Visit: Payer: Self-pay | Admitting: Family Medicine

## 2013-01-07 ENCOUNTER — Encounter: Payer: Self-pay | Admitting: Family Medicine

## 2013-02-09 ENCOUNTER — Other Ambulatory Visit: Payer: Self-pay | Admitting: Family Medicine

## 2013-02-15 ENCOUNTER — Other Ambulatory Visit: Payer: Self-pay | Admitting: Internal Medicine

## 2013-02-17 ENCOUNTER — Telehealth: Payer: Self-pay | Admitting: Internal Medicine

## 2013-02-17 MED ORDER — FLUTICASONE-SALMETEROL 100-50 MCG/DOSE IN AEPB: INHALATION_SPRAY | RESPIRATORY_TRACT | Status: DC

## 2013-02-17 NOTE — Telephone Encounter (Signed)
Refill sent and pt is aware. [place] Bing, CMA

## 2013-02-17 NOTE — Telephone Encounter (Signed)
Refill sent, duplicate message. [place] Bing, CMA

## 2013-02-18 ENCOUNTER — Other Ambulatory Visit: Payer: Self-pay | Admitting: Internal Medicine

## 2013-03-10 ENCOUNTER — Encounter: Payer: Self-pay | Admitting: Family Medicine

## 2013-04-28 ENCOUNTER — Encounter: Payer: Self-pay | Admitting: Internal Medicine

## 2013-04-28 ENCOUNTER — Ambulatory Visit (INDEPENDENT_AMBULATORY_CARE_PROVIDER_SITE_OTHER)
Admission: RE | Admit: 2013-04-28 | Discharge: 2013-04-28 | Disposition: A | Discharge status: Home / Self Care | Payer: Medicare Other | Source: Ambulatory Visit | Attending: Internal Medicine | Admitting: Internal Medicine

## 2013-04-28 ENCOUNTER — Ambulatory Visit (INDEPENDENT_AMBULATORY_CARE_PROVIDER_SITE_OTHER): Payer: Medicare Other | Admitting: Internal Medicine

## 2013-04-28 VITALS — BP 118/78 | HR 76 | Ht 70.0 in | Wt 192.4 lb

## 2013-04-28 DIAGNOSIS — J209 Acute bronchitis, unspecified: Secondary | ICD-10-CM

## 2013-04-28 DIAGNOSIS — J42 Unspecified chronic bronchitis: Secondary | ICD-10-CM

## 2013-04-28 DIAGNOSIS — J328 Other chronic sinusitis: Secondary | ICD-10-CM

## 2013-04-28 MED ORDER — AMOXICILLIN-POT CLAVULANATE 875-125 MG PO TABS: 1.0000 | ORAL_TABLET | Freq: Two times a day (BID) | ORAL | Status: DC

## 2013-04-28 NOTE — Patient Instructions (Addendum)
Order CXR   Dx Acute bronchitis  Flu vax  Scrip sent for augmentin antibiotic  Consider a nasal decongestant like otc Sudafed- PE to use as needed  Please call if we can help

## 2013-04-28 NOTE — Progress Notes (Signed)
Patient ID: [name], male    DOB: [birth year], 72 y.o.   MRN: [id number]  HPI 03/08/11- 72 year old male former smoker followed for chronic asthmatic bronchitis and rhinitis with history of chronic sinusitis and colitis. Last here August 16, 2010- CXR 08/16/10- NAD.  CXR 10/17/10 -preop for hernia repair- NAD, mild thoracic spine DGD. Now for past 2-3 weeks has had increased nasal congestion, maybe a little inner ear, light headedness on standing, and increased chest congestion with yellow sputum, no fever.  09/08/11- 72 year old male former smoker followed for chronic asthmatic bronchitis and rhinitis with history of chronic sinusitis and colitis. He did better with Cefdinir after last visit but never really cleared. When lying supine he notices nasal congestion with postnasal drip and cough productive of discolored sputum. Mild shortness of breath with exertion. Denies fever, nodes, headache, chest pain, palpitation.  10/06/11- 72 year old male former smoker followed for chronic asthmatic bronchitis and rhinitis with history of chronic sinusitis and colitis. He feels that he is back to baseline now. Some cough with occasional clear mucus but much better. Nasonex helps his nasal congestion and drainage.  04/22/12- 72 year old male former smoker followed for chronic asthmatic bronchitis and rhinitis with history of chronic sinusitis and colitis. Denies any new flare ups of wheezing or SOB; breathing about the same as last visit. Had a good summer despite the rain. Could not afford Daliresp.  04/28/13- 72 year old male former smoker followed for chronic asthmatic bronchitis and rhinitis with history of chronic sinusitis, complicated by ulcerative colitis. FOLLOWS FOR: cough-productive-drak greenish/brown in color; head congestion, chest congestion, slight wheezing. Once able to get phelgm up he feels little better. Head congestion bothers him more than chest congestion. No fever. On increased  prednisone for 6 months for flare of his ulcerative colitis. Now down to 20 mg daily. CXR 09/08/11-  Findings: Left lower lobe scarring is stable. Heart size is  normal. The lungs are otherwise clear. No pleural effusion. No  acute osseous abnormality. Left humeral bone anchors are in place.  Right AC joint degenerative change partly visualized.  IMPRESSION:  No acute cardiopulmonary process.  Original Report Authenticated By: Arline Asp, M.D.  ROS-see HPI Constitutional:   No-   weight loss, night sweats, fevers, chills, fatigue, lassitude. HEENT:   No-  headaches, difficulty swallowing, tooth/dental problems, sore throat,       No-  sneezing, itching, ear ache,  +nasal congestion, post nasal drip,  CV:  No-   chest pain, orthopnea, PND, swelling in lower extremities, anasarca,  dizziness, palpitations Resp: +  shortness of breath with exertion or at rest.              +  productive cough,  No non-productive cough,  No- coughing up of blood.              +  change in color of mucus.  No- wheezing.   Skin: No-   rash or lesions. GI:  No-   heartburn, indigestion, abdominal pain, nausea, vomiting,  GU:  MS:  No-   joint pain or swelling.  Neuro-     nothing unusual Psych:  No- change in mood or affect. No depression or anxiety.  No memory loss.    Objective:   Physical Exam General- Alert, Oriented, Affect-appropriate, Distress- none acute, trim Skin- rash-none, lesions- none, excoriation- none Lymphadenopathy- none Head- atraumatic            Eyes- Gross vision intact, PERRLA, conjunctivae clear secretions  Ears- Hearing, canals- normal, no red or fluid            Nose- Clear, No-Septal dev, mucus, polyps, erosion, perforation             Throat- Mallampati II , mucosa clear ,+ drainage- thick white, tonsils- atrophic Neck- flexible , trachea midline, no stridor , thyroid nl, carotid no bruit Chest - symmetrical excursion , unlabored           Heart/CV- RRR , no  murmur , no gallop  , no rub, nl s1 s2                           - JVD- none , edema- none, stasis changes- none, varices- none           Lung- + rhonchi in bases, unlabored , dullness-none, rub- none,           Chest wall-  Abd-  Br/ Gen/ Rectal- Not done, not indicated Extrem- cyanosis- none, clubbing, none, atrophy- none, strength- nl Neuro- grossly intact to observation

## 2013-04-29 NOTE — Progress Notes (Signed)
Quick Note:  Informed pt of CXR results and he verbalized understanding and has no further questions or concerns at this time ______

## 2013-05-07 ENCOUNTER — Other Ambulatory Visit: Payer: Self-pay | Admitting: Internal Medicine

## 2013-05-10 NOTE — Assessment & Plan Note (Signed)
Acute upper respiratory infection may include sinusitis now. Plan-Augmentin, Sudafed-PE, fluids

## 2013-05-10 NOTE — Assessment & Plan Note (Signed)
Acute exacerbation associated with upper respiratory infection. Plan-chest x-ray, Augmentin, flu shot

## 2013-05-18 ENCOUNTER — Other Ambulatory Visit: Payer: Self-pay | Admitting: Family Medicine

## 2013-05-19 NOTE — Telephone Encounter (Signed)
Med filled.  

## 2013-05-29 ENCOUNTER — Other Ambulatory Visit: Payer: Self-pay | Admitting: Gastroenterology

## 2013-06-09 ENCOUNTER — Other Ambulatory Visit: Payer: Self-pay | Admitting: Internal Medicine

## 2013-07-09 ENCOUNTER — Encounter: Payer: Self-pay | Admitting: Family Medicine

## 2013-07-15 ENCOUNTER — Encounter: Payer: Self-pay | Admitting: Family Medicine

## 2013-07-15 ENCOUNTER — Ambulatory Visit (INDEPENDENT_AMBULATORY_CARE_PROVIDER_SITE_OTHER): Payer: Medicare Other | Admitting: Family Medicine

## 2013-07-15 VITALS — BP 140/90 | HR 80 | Temp 97.8°F | Ht 69.0 in | Wt 192.0 lb

## 2013-07-15 DIAGNOSIS — Z0000 Encounter for general adult medical examination without abnormal findings: Secondary | ICD-10-CM

## 2013-07-15 DIAGNOSIS — K5289 Other specified noninfective gastroenteritis and colitis: Secondary | ICD-10-CM

## 2013-07-15 DIAGNOSIS — R030 Elevated blood-pressure reading, without diagnosis of hypertension: Secondary | ICD-10-CM

## 2013-07-15 DIAGNOSIS — E785 Hyperlipidemia, unspecified: Secondary | ICD-10-CM

## 2013-07-15 DIAGNOSIS — J42 Unspecified chronic bronchitis: Secondary | ICD-10-CM

## 2013-07-15 LAB — LIPID PANEL
HDL: 41 mg/dL (ref >39.00)
Triglycerides: 201 mg/dL — ABNORMAL HIGH (ref 0.0–149.0)
VLDL: 40.2 mg/dL — ABNORMAL HIGH (ref 0.0–40.0)

## 2013-07-15 LAB — BASIC METABOLIC PANEL
BUN: 23 mg/dL (ref 6–23)
Calcium: 9.2 mg/dL (ref 8.4–10.5)
Chloride: 101 mEq/L (ref 96–112)
GFR: 71.28 mL/min (ref >60.00)
Potassium: 3.7 mEq/L (ref 3.5–5.1)
Sodium: 134 mEq/L — ABNORMAL LOW (ref 135–145)

## 2013-07-15 LAB — HEPATIC FUNCTION PANEL
ALT: 41 U/L (ref 0–53)
AST: 17 U/L (ref 0–37)
Alkaline Phosphatase: 56 U/L (ref 39–117)
Total Bilirubin: 0.5 mg/dL (ref 0.3–1.2)

## 2013-07-15 LAB — CBC WITH DIFFERENTIAL/PLATELET
Basophils Absolute: 0 10*3/uL (ref 0.0–0.1)
Basophils Relative: 0.3 % (ref 0.0–3.0)
Eosinophils Absolute: 0 10*3/uL (ref 0.0–0.7)
Hemoglobin: 13.8 g/dL (ref 13.0–17.0)
Lymphs Abs: 1.7 10*3/uL (ref 0.7–4.0)
MCHC: 33.7 g/dL (ref 30.0–36.0)
MCV: 86.8 fl (ref 78.0–100.0)
Monocytes Absolute: 0.8 10*3/uL (ref 0.1–1.0)
Monocytes Relative: 6.6 % (ref 3.0–12.0)
Neutrophils Relative %: 79.2 % — ABNORMAL HIGH (ref 43.0–77.0)
Platelets: 395 10*3/uL (ref 150.0–400.0)

## 2013-07-15 LAB — TSH: TSH: 1.08 u[IU]/mL (ref 0.35–5.50)

## 2013-07-15 LAB — LDL CHOLESTEROL, DIRECT: Direct LDL: 130.1 mg/dL

## 2013-07-15 NOTE — Progress Notes (Signed)
   Subjective:    Patient ID: [name], male    DOB: 05-28-1941, 72 y.o.   MRN: [id number]  HPI Here today for CPE.  Risk Factors: Hyperlipidemia- chronic problem, on Zocor.  Denies abd pain, N/V, myalgias UC- chronic problem, following w/ GI.  Has been on Prednisone since Feb due to flare.  Has appt in Jan at [facility] for 2nd opinion. COPD- chronic problem, following w/ pulm (Dr Annamaria Boots).  On Advair, Singulair, Albuterol prn. Elevated BP- pt's BP is slightly elevated but is on chronic prednisone at this time.  No CP, HAs, visual changes, edema above baseline. Physical Activity: exercising regularly- goes to Gym Fall Risk: low risk Depression: no current sxs Hearing: decreased to conversational tones and whispered voice- following w/ Audiology, due for appt in Feb ADL's: independent Cognitive: normal linear thought process, memory and attention intact Home Safety: safe at home Height, Weight, BMI, Visual Acuity: see vitals, vision corrected to 20/20 w/ glasses Counseling: UTD on colonoscopy, following w/ urology. Labs Ordered: See A&P Care Plan: See A&P    Review of Systems Patient reports no vision/hearing changes, anorexia, fever ,adenopathy, persistant/recurrent hoarseness, swallowing issues, chest pain, palpitations, edema, persistant/recurrent cough, hemoptysis, abdominal pain, excessive heart burn, GU symptoms (dysuria, hematuria, voiding/incontinence issues) syncope, focal weakness, memory loss, numbness & tingling, skin/hair/nail changes, depression, anxiety, abnormal bruising/bleeding, musculoskeletal symptoms/signs.  + chronic SOB + UC     Objective:   Physical Exam BP 140/90  Pulse 80  Temp(Src) 97.8 F (36.6 C) (Oral)  Ht 5' 9"  (1.753 m)  Wt 192 lb (87.091 kg)  BMI 28.34 kg/m2  SpO2 94%  General Appearance:    Alert, cooperative, no distress, appears younger than stated age, puffy from prednisone  Head:    Normocephalic, without obvious abnormality, atraumatic    Eyes:    PERRL, conjunctiva/corneas clear, EOM's intact, fundi    benign, both eyes       Ears:    Normal TM's and external ear canals, both ears  Nose:   Nares normal, septum midline, mucosa normal, no drainage   or sinus tenderness  Throat:   Lips, mucosa, and tongue normal; teeth and gums normal  Neck:   Supple, symmetrical, trachea midline, no adenopathy;       thyroid:  No enlargement/tenderness/nodules  Back:     Symmetric, no curvature, ROM normal, no CVA tenderness  Lungs:     Clear to auscultation bilaterally, respirations unlabored  Chest wall:    No tenderness or deformity  Heart:    Regular rate and rhythm, S1 and S2 normal, no murmur, rub   or gallop  Abdomen:     Soft, non-tender, bowel sounds active all four quadrants,    no masses, no organomegaly  Genitalia:    Deferred to urology  Rectal:    Extremities:   Extremities normal, atraumatic, no cyanosis or edema  Pulses:   2+ and symmetric all extremities  Skin:   Skin color, texture, turgor normal, no rashes or lesions  Lymph nodes:   Cervical, supraclavicular, and axillary nodes normal  Neurologic:   CNII-XII intact. Normal strength, sensation and reflexes      throughout          Assessment & Plan:

## 2013-07-15 NOTE — Progress Notes (Signed)
Pre visit review using our clinic review tool, if applicable. No additional management support is needed unless otherwise documented below in the visit note. 

## 2013-07-15 NOTE — Patient Instructions (Signed)
Follow up in 2-3 months to recheck blood pressure We'll notify you of your lab results and make any changes if needed Keep up the good work! Call with any questions or concerns Hang in there!! Happy Holidays!!!

## 2013-07-16 ENCOUNTER — Encounter: Payer: Self-pay | Admitting: *Deleted

## 2013-07-16 NOTE — Assessment & Plan Note (Signed)
BP again elevated but suspect this is due to steroid use.  Not at threshold to start meds but will follow.

## 2013-07-16 NOTE — Assessment & Plan Note (Signed)
Pt's PE WNL w/ exception of puffiness from chronic steroid use.  UTD on colonoscopy, urology.  Check labs.  Anticipatory guidance provided.

## 2013-07-16 NOTE — Assessment & Plan Note (Signed)
Chronic problem, following w/ Dr Wallis Mart and has 2nd opinion scheduled at [facility]

## 2013-07-16 NOTE — Assessment & Plan Note (Signed)
Following w/ Pulmonary.

## 2013-07-16 NOTE — Assessment & Plan Note (Signed)
Chronic problem.  Tolerating statin w/out difficulty.  Check labs.  Adjust meds prn

## 2013-08-12 ENCOUNTER — Telehealth: Payer: Self-pay | Admitting: Family Medicine

## 2013-08-12 DIAGNOSIS — IMO0002 Reserved for concepts with insufficient information to code with codable children: Secondary | ICD-10-CM

## 2013-08-12 NOTE — Telephone Encounter (Signed)
Order placed and patient notified

## 2013-08-12 NOTE — Telephone Encounter (Signed)
Please advise. SW

## 2013-08-12 NOTE — Telephone Encounter (Signed)
Patient states that Dr. Cristina Gong asked him to check with Dr. Birdie Riddle to see if she would recommend he have a bone density since he has been on Prednisone since last February. Please advise.

## 2013-08-12 NOTE — Telephone Encounter (Signed)
Yes- we should order a DEXA (dx chronic use of steroids)

## 2013-08-14 ENCOUNTER — Other Ambulatory Visit: Payer: Self-pay | Admitting: Family Medicine

## 2013-08-14 NOTE — Telephone Encounter (Signed)
Med filled.  

## 2013-08-15 ENCOUNTER — Ambulatory Visit (INDEPENDENT_AMBULATORY_CARE_PROVIDER_SITE_OTHER)
Admission: RE | Admit: 2013-08-15 | Discharge: 2013-08-15 | Disposition: A | Discharge status: Home / Self Care | Payer: Medicare Other | Source: Ambulatory Visit | Attending: Family Medicine | Admitting: Family Medicine

## 2013-08-15 DIAGNOSIS — IMO0002 Reserved for concepts with insufficient information to code with codable children: Secondary | ICD-10-CM

## 2013-08-18 ENCOUNTER — Encounter: Payer: Self-pay | Admitting: General Practice

## 2013-08-28 ENCOUNTER — Ambulatory Visit (INDEPENDENT_AMBULATORY_CARE_PROVIDER_SITE_OTHER): Payer: Medicare Other | Admitting: Internal Medicine

## 2013-08-28 ENCOUNTER — Encounter: Payer: Self-pay | Admitting: Internal Medicine

## 2013-08-28 VITALS — BP 132/84 | HR 87 | Ht 70.0 in | Wt 199.6 lb

## 2013-08-28 DIAGNOSIS — J209 Acute bronchitis, unspecified: Secondary | ICD-10-CM

## 2013-08-28 DIAGNOSIS — J42 Unspecified chronic bronchitis: Secondary | ICD-10-CM

## 2013-08-28 MED ORDER — DOXYCYCLINE HYCLATE 100 MG PO TABS: ORAL_TABLET | ORAL | Status: DC

## 2013-08-28 MED ORDER — FLUTICASONE FUROATE-VILANTEROL 100-25 MCG/INH IN AEPB: 1.0000 | INHALATION_SPRAY | Freq: Every day | RESPIRATORY_TRACT | Status: DC

## 2013-08-28 NOTE — Progress Notes (Signed)
Patient ID: [name], male    DOB: May 11, 1941, 73 y.o.   MRN: [id number]  HPI 03/08/11- 73 year old male former smoker followed for chronic asthmatic bronchitis and rhinitis with history of chronic sinusitis and colitis. Last here August 16, 2010- CXR 08/16/10- NAD.  CXR 10/17/10 -preop for hernia repair- NAD, mild thoracic spine DGD. Now for past 2-3 weeks has had increased nasal congestion, maybe a little inner ear, light headedness on standing, and increased chest congestion with yellow sputum, no fever.  09/08/11- 73 year old male former smoker followed for chronic asthmatic bronchitis and rhinitis with history of chronic sinusitis and colitis. He did better with Cefdinir after last visit but never really cleared. When lying supine he notices nasal congestion with postnasal drip and cough productive of discolored sputum. Mild shortness of breath with exertion. Denies fever, nodes, headache, chest pain, palpitation.  10/06/11- 73 year old male former smoker followed for chronic asthmatic bronchitis and rhinitis with history of chronic sinusitis and colitis. He feels that he is back to baseline now. Some cough with occasional clear mucus but much better. Nasonex helps his nasal congestion and drainage.  04/22/12- 73 year old male former smoker followed for chronic asthmatic bronchitis and rhinitis with history of chronic sinusitis and colitis. Denies any new flare ups of wheezing or SOB; breathing about the same as last visit. Had a good summer despite the rain. Could not afford Daliresp.  04/28/13- 73 year old male former smoker followed for chronic asthmatic bronchitis and rhinitis with history of chronic sinusitis, complicated by ulcerative colitis. FOLLOWS FOR: cough-productive-drak greenish/brown in color; head congestion, chest congestion, slight wheezing. Once able to get phelgm up he feels little better. Head congestion bothers him more than chest congestion. No fever. On increased  prednisone for 6 months for flare of his ulcerative colitis. Now down to 20 mg daily. CXR 09/08/11-  Findings: Left lower lobe scarring is stable. Heart size is  normal. The lungs are otherwise clear. No pleural effusion. No  acute osseous abnormality. Left humeral bone anchors are in place.  Right AC joint degenerative change partly visualized.  IMPRESSION:  No acute cardiopulmonary process.  Original Report Authenticated By: Arline Asp, M.D.  08/28/13- 73 year old male former smoker followed for chronic asthmatic bronchitis and rhinitis with history of chronic sinusitis, complicated by ulcerative colitis. Had URI 2-3 wks. ago,increased sob,no energy,cough-lt. green, no fcs,occass. wheezing,denies cp or tightness. On chronic severe weight for ulcerative colitis. Advair Diskus is too expensive with his insurance .CXR 04/28/13 IMPRESSION:  COPD changes with minimal atelectasis versus scarring at left base.  No acute infiltrate.  Electronically Signed  By: Lavonia Dana M.D.  On: 04/28/2013 09:27  ROS-see HPI Constitutional:   No-   weight loss, night sweats, fevers, chills, fatigue, lassitude. HEENT:   No-  headaches, difficulty swallowing, tooth/dental problems, sore throat,       No-  sneezing, itching, ear ache,  +nasal congestion, post nasal drip,  CV:  No-   chest pain, orthopnea, PND, swelling in lower extremities, anasarca,  dizziness, palpitations Resp: +  shortness of breath with exertion or at rest.              +  productive cough,  No non-productive cough,  No- coughing up of blood.              +  change in color of mucus.  No- wheezing.   Skin: No-   rash or lesions. GI:  No-   heartburn, indigestion, abdominal pain, nausea, vomiting,  GU:  MS:  No-   joint pain or swelling.  Neuro-     nothing unusual Psych:  No- change in mood or affect. No depression or anxiety.  No memory loss.    Objective:   Physical Exam General- Alert, Oriented, Affect-appropriate,  Distress- none acute, steroid type weight gain Skin- rash-none, lesions- none, excoriation- none Lymphadenopathy- none Head- atraumatic            Eyes- Gross vision intact, PERRLA, conjunctivae clear secretions            Ears- Hearing, canals- normal, no red or fluid            Nose- Clear, No-Septal dev, mucus, polyps, erosion, perforation             Throat- Mallampati II , mucosa clear ,+ drainage- thick white, tonsils- atrophic Neck- flexible , trachea midline, no stridor , thyroid nl, carotid no bruit Chest - symmetrical excursion , unlabored           Heart/CV- RRR , no murmur , no gallop  , no rub, nl s1 s2                           - JVD- none , edema- none, stasis changes- none, varices- none           Lung- + rhonchi in bases, + cough-raspy, unlabored , dullness-none, rub- none,           Chest wall-  Abd-  Br/ Gen/ Rectal- Not done, not indicated Extrem- cyanosis- none, clubbing, none, atrophy- none, strength- nl Neuro- grossly intact to observation

## 2013-08-28 NOTE — Patient Instructions (Signed)
Sample Breo Ellipta   1 puff, then rinse mouth  One time daily  Try this instead of Advair Price compare Breo, Dulera 100, Symbicort 80  And Advair 100  with your insurance  Please call as needed

## 2013-09-05 ENCOUNTER — Telehealth: Payer: Self-pay | Admitting: Internal Medicine

## 2013-09-05 NOTE — Telephone Encounter (Signed)
Spoke with pt. He is going to stay on advair. He did not need RX called in. Nothing further needed

## 2013-09-05 NOTE — Telephone Encounter (Signed)
Sample Breo Ellipta   1 puff, then rinse mouth  One time daily  Try this instead of Advair Price compare Breo, Dulera 100, Symbicort 80  And Advair 100  with your insurance --  Called and spoke with pt. He reports he can't tell just yet that this has helped yet. He ahs discount card for the breo and this is also the same price as the advair. Please advise Dr. Annamaria Boots thanks

## 2013-09-05 NOTE — Telephone Encounter (Signed)
He can choose then, to either use Breo or Advair.

## 2013-09-11 ENCOUNTER — Encounter: Payer: Medicare Other | Admitting: Family Medicine

## 2013-09-19 ENCOUNTER — Ambulatory Visit (INDEPENDENT_AMBULATORY_CARE_PROVIDER_SITE_OTHER): Payer: Medicare Other

## 2013-09-19 ENCOUNTER — Encounter (INDEPENDENT_AMBULATORY_CARE_PROVIDER_SITE_OTHER): Payer: Medicare Other | Admitting: Neurology

## 2013-09-19 DIAGNOSIS — R209 Unspecified disturbances of skin sensation: Secondary | ICD-10-CM

## 2013-09-19 DIAGNOSIS — Z0289 Encounter for other administrative examinations: Secondary | ICD-10-CM

## 2013-09-19 NOTE — Procedures (Signed)
     HISTORY:  [name] is a 73 year old gentleman with a history of ulcerative colitis, recently on prednisone. The patient has severe peripheral edema bilaterally in the lower extremities. The patient has noted a two-year history of numbness in the third, fourth, and fifth toes of the right foot unassociated with discomfort, unassociated with any progression. The patient denies any back pain or pain radiating down the right leg. The patient denies any balance issues or weakness. The patient is being evaluated for possible neuropathy or a lumbosacral radiculopathy.  NERVE CONDUCTION STUDIES:  Nerve conduction studies were performed on both lower extremities. The distal motor latencies for the peroneal and posterior tibial nerves were normal bilaterally, with low motor amplitudes for these nerves bilaterally, with exception of a borderline normal motor amplitude for the left peroneal nerve. The nerve conduction velocities for the peroneal and posterior tibial nerves were normal bilaterally. The H reflex latencies were symmetric and normal. The peroneal sensory latencies were normal bilaterally.  EMG STUDIES:  EMG study was performed on the right lower extremity:  The tibialis anterior muscle reveals 2 to 4K motor units with full recruitment. No fibrillations or positive waves were seen. The peroneus tertius muscle reveals 2 to 4K motor units with full recruitment. No fibrillations or positive waves were seen. The medial gastrocnemius muscle reveals 1 to 3K motor units with full recruitment. No fibrillations or positive waves were seen. The vastus lateralis muscle reveals 2 to 4K motor units with full recruitment. No fibrillations or positive waves were seen. The iliopsoas muscle reveals 2 to 4K motor units with full recruitment. No fibrillations or positive waves were seen. The biceps femoris muscle (long head) reveals 2 to 4K motor units with full recruitment. No fibrillations or positive  waves were seen. The lumbosacral paraspinal muscles were tested at 3 levels, and revealed no abnormalities of insertional activity at all 3 levels tested. There was good relaxation.   IMPRESSION:  Nerve conduction studies done on both lower extremities were unremarkable with exception of a few slightly low motor amplitudes throughout. The patient has significant peripheral edema bilaterally, and this may be the etiology of the low motor amplitudes. No clear evidence of a peripheral neuropathy is seen. EMG evaluation of the right lower extremity was normal, without evidence of an overlying lumbosacral radiculopathy. The patient may have a small sensory branch lesion involving the distal peroneal nerve.  Jill Alexanders MD 09/19/2013 10:33 AM  [facility] [address] [place], [place] [address]  Phone [phone] Fax [phone]

## 2013-09-22 ENCOUNTER — Encounter: Payer: Medicare Other | Admitting: Neurology

## 2013-09-23 DIAGNOSIS — J42 Unspecified chronic bronchitis: Principal | ICD-10-CM

## 2013-09-23 DIAGNOSIS — J209 Acute bronchitis, unspecified: Secondary | ICD-10-CM | POA: Insufficient documentation

## 2013-09-23 NOTE — Assessment & Plan Note (Signed)
Steroids affecting his immune response and making it harder for this to resolve Plan-sample Breo Ellipta-compare prices with Advair, doxycycline

## 2013-09-29 ENCOUNTER — Ambulatory Visit: Payer: Medicare Other | Admitting: Family Medicine

## 2013-11-16 ENCOUNTER — Other Ambulatory Visit: Payer: Self-pay | Admitting: Family Medicine

## 2013-11-17 NOTE — Telephone Encounter (Signed)
Med filled.  

## 2014-02-05 ENCOUNTER — Emergency Department (HOSPITAL_COMMUNITY)
Admission: EM | Admit: 2014-02-05 | Discharge: 2014-02-05 | Disposition: A | Discharge status: Home / Self Care | Payer: Medicare Other | Attending: Emergency Medicine | Admitting: Emergency Medicine

## 2014-02-05 ENCOUNTER — Emergency Department (HOSPITAL_COMMUNITY): Payer: Medicare Other

## 2014-02-05 ENCOUNTER — Encounter (HOSPITAL_COMMUNITY): Payer: Self-pay | Admitting: Emergency Medicine

## 2014-02-05 DIAGNOSIS — K5180 Other ulcerative colitis without complications: Secondary | ICD-10-CM | POA: Insufficient documentation

## 2014-02-05 DIAGNOSIS — Z7982 Long term (current) use of aspirin: Secondary | ICD-10-CM | POA: Insufficient documentation

## 2014-02-05 DIAGNOSIS — H9190 Unspecified hearing loss, unspecified ear: Secondary | ICD-10-CM | POA: Insufficient documentation

## 2014-02-05 DIAGNOSIS — N201 Calculus of ureter: Secondary | ICD-10-CM | POA: Insufficient documentation

## 2014-02-05 DIAGNOSIS — Z79899 Other long term (current) drug therapy: Secondary | ICD-10-CM | POA: Insufficient documentation

## 2014-02-05 DIAGNOSIS — Z87891 Personal history of nicotine dependence: Secondary | ICD-10-CM | POA: Insufficient documentation

## 2014-02-05 DIAGNOSIS — J449 Chronic obstructive pulmonary disease, unspecified: Secondary | ICD-10-CM | POA: Insufficient documentation

## 2014-02-05 DIAGNOSIS — Z880 Allergy status to penicillin: Secondary | ICD-10-CM | POA: Insufficient documentation

## 2014-02-05 DIAGNOSIS — IMO0002 Reserved for concepts with insufficient information to code with codable children: Secondary | ICD-10-CM | POA: Insufficient documentation

## 2014-02-05 DIAGNOSIS — J4489 Other specified chronic obstructive pulmonary disease: Secondary | ICD-10-CM | POA: Insufficient documentation

## 2014-02-05 LAB — CBC WITH DIFFERENTIAL/PLATELET
Basophils Absolute: 0.1 10*3/uL (ref 0.0–0.1)
Basophils Relative: 0 % (ref 0–1)
Eosinophils Absolute: 0.1 10*3/uL (ref 0.0–0.7)
Eosinophils Relative: 0 % (ref 0–5)
HCT: 39.3 % (ref 39.0–52.0)
Hemoglobin: 13.1 g/dL (ref 13.0–17.0)
Lymphocytes Relative: 12 % (ref 12–46)
Lymphs Abs: 2.1 10*3/uL (ref 0.7–4.0)
MCH: 28.4 pg (ref 26.0–34.0)
MCHC: 33.3 g/dL (ref 30.0–36.0)
MCV: 85.2 fL (ref 78.0–100.0)
Monocytes Absolute: 1.3 10*3/uL — ABNORMAL HIGH (ref 0.1–1.0)
Monocytes Relative: 7 % (ref 3–12)
Neutro Abs: 14.2 10*3/uL — ABNORMAL HIGH (ref 1.7–7.7)
Neutrophils Relative %: 81 % — ABNORMAL HIGH (ref 43–77)
Platelets: 407 10*3/uL — ABNORMAL HIGH (ref 150–400)
RBC: 4.61 MIL/uL (ref 4.22–5.81)
RDW: 16.6 % — ABNORMAL HIGH (ref 11.5–15.5)
WBC: 17.7 10*3/uL — ABNORMAL HIGH (ref 4.0–10.5)

## 2014-02-05 LAB — BASIC METABOLIC PANEL
Anion gap: 18 — ABNORMAL HIGH (ref 5–15)
BUN: 22 mg/dL (ref 6–23)
CO2: 20 mEq/L (ref 19–32)
Calcium: 9.6 mg/dL (ref 8.4–10.5)
Chloride: 102 mEq/L (ref 96–112)
Creatinine, Ser: 1 mg/dL (ref 0.50–1.35)
GFR calc Af Amer: 84 mL/min — ABNORMAL LOW (ref >90)
GFR calc non Af Amer: 73 mL/min — ABNORMAL LOW (ref >90)
Glucose, Bld: 136 mg/dL — ABNORMAL HIGH (ref 70–99)
Potassium: 4.1 mEq/L (ref 3.7–5.3)
Sodium: 140 mEq/L (ref 137–147)

## 2014-02-05 LAB — URINALYSIS, ROUTINE W REFLEX MICROSCOPIC
Bilirubin Urine: NEGATIVE
Glucose, UA: NEGATIVE mg/dL
Ketones, ur: NEGATIVE mg/dL
Nitrite: NEGATIVE
Protein, ur: NEGATIVE mg/dL
Specific Gravity, Urine: 1.028 (ref 1.005–1.030)
Urobilinogen, UA: 0.2 mg/dL (ref 0.0–1.0)
pH: 5 (ref 5.0–8.0)

## 2014-02-05 LAB — URINE MICROSCOPIC-ADD ON

## 2014-02-05 MED ORDER — KETOROLAC TROMETHAMINE 15 MG/ML IJ SOLN
15.0000 mg | Freq: Once | INTRAMUSCULAR | Status: AC
  Administered 2014-02-05: 15 mg via INTRAVENOUS
  Filled 2014-02-05: qty 1

## 2014-02-05 MED ORDER — ONDANSETRON HCL 4 MG PO TABS: 4.0000 mg | ORAL_TABLET | Freq: Four times a day (QID) | ORAL | Status: DC

## 2014-02-05 MED ORDER — SODIUM CHLORIDE 0.9 % IV BOLUS (SEPSIS)
1000.0000 mL | Freq: Once | INTRAVENOUS | Status: AC
  Administered 2014-02-05: 1000 mL via INTRAVENOUS

## 2014-02-05 MED ORDER — OXYCODONE-ACETAMINOPHEN 5-325 MG PO TABS: 1.0000 | ORAL_TABLET | ORAL | Status: DC | PRN

## 2014-02-05 MED ORDER — HYDROMORPHONE HCL PF 1 MG/ML IJ SOLN
1.0000 mg | Freq: Once | INTRAMUSCULAR | Status: AC
  Administered 2014-02-05: 1 mg via INTRAVENOUS
  Filled 2014-02-05: qty 1

## 2014-02-05 MED ORDER — ONDANSETRON HCL 4 MG/2ML IJ SOLN
4.0000 mg | Freq: Once | INTRAMUSCULAR | Status: AC
  Administered 2014-02-05: 4 mg via INTRAVENOUS
  Filled 2014-02-05: qty 2

## 2014-02-05 NOTE — ED Provider Notes (Signed)
CSN: [id number]     Arrival date & time 02/05/14  0840 History   First MD Initiated Contact with Patient 02/05/14 0848     No chief complaint on file.    (Consider location/radiation/quality/duration/timing/severity/associated sxs/prior Treatment) HPI  73 year old male with left flank pain. Onset this morning at approximately 7:30. Pain is sharp and constant. Associated with nausea and vomiting. No urinary complaints. No appreciable exacerbating or relieving factors.  No fevers or chills. No history similar pain. No history of kidney stones. No chest pain or shortness of breath. No intervention prior to arrival.  Past Medical History  Diagnosis Date  . Ulcerative colitis   . Asthma   . Chronic bronchitis   . Sinusitis   . Peripheral edema   . Hearing loss in left ear   . COPD (chronic obstructive pulmonary disease)    Past Surgical History  Procedure Laterality Date  . Rotator cuff repair      bilateral shoulders    Family History  Problem Relation Age of Onset  . Heart disease Mother     MI  . Hyperlipidemia Mother   . Other Mother     varicose veins  . Heart attack Mother   . COPD Sister   . Hyperlipidemia Father   . Hypertension Father    History  Substance Use Topics  . Smoking status: Former Smoker -- 15 years    Types: Cigarettes    Quit date: 08/08/1971  . Smokeless tobacco: Former Systems developer    Quit date: 08/08/1971  . Alcohol Use: No    Review of Systems  All systems reviewed and negative, other than as noted in HPI.   Allergies  Lipitor; Moxifloxacin; and Penicillins  Home Medications   Prior to Admission medications   Medication Sig Start Date End Date Taking? Authorizing Provider  acetaminophen (TYLENOL) 500 MG tablet Take 1,000 mg by mouth every 8 (eight) hours as needed for moderate pain.    Yes Historical Provider, MD  albuterol (PROVENTIL HFA;VENTOLIN HFA) 108 (90 BASE) MCG/ACT inhaler Inhale 2 puffs into the lungs 4 (four) times daily.   Yes  Historical Provider, MD  aspirin EC 81 MG tablet Take 1 tablet (81 mg total) by mouth daily. 05/30/12  Yes Midge Minium, MD  CALCIUM PO Take 1 tablet by mouth daily.   Yes Historical Provider, MD  Cyanocobalamin (VITAMIN B-12 PO) Take 1 tablet by mouth daily.   Yes Historical Provider, MD  Fluticasone-Salmeterol (ADVAIR) 100-50 MCG/DOSE AEPB Inhale 1 puff into the lungs 2 (two) times daily.   Yes Historical Provider, MD  folic acid (FOLVITE) 1 MG tablet Take 1 mg by mouth daily.   Yes Historical Provider, MD  guaiFENesin (MUCINEX) 600 MG 12 hr tablet Take 600 mg by mouth daily as needed for to loosen phlegm.    Yes Historical Provider, MD  loratadine (CLARITIN) 10 MG tablet Take 10 mg by mouth daily.     Yes Historical Provider, MD  Methotrexate, PF, 25 MG/0.5ML SOAJ Inject 25 mg into the skin every 7 (seven) days. Wednesdays   Yes Historical Provider, MD  montelukast (SINGULAIR) 10 MG tablet Take 10 mg by mouth at bedtime.   Yes Historical Provider, MD  pantoprazole (PROTONIX) 40 MG tablet Take 40 mg by mouth daily.     Yes Historical Provider, MD  predniSONE (DELTASONE) 10 MG tablet Take 20 mg by mouth daily with breakfast.    Yes Historical Provider, MD  simvastatin (ZOCOR) 20 MG tablet Take 20  mg by mouth daily.   Yes Historical Provider, MD  Tamsulosin HCl (FLOMAX) 0.4 MG CAPS Take 1 capsule (0.4 mg total) by mouth daily. 10/16/11  Yes Midge Minium, MD   BP 162/91  Pulse 53  Temp(Src) 97.7 F (36.5 C) (Oral)  Resp 14  Ht 5' 10"  (1.778 m)  Wt 175 lb (79.379 kg)  BMI 25.11 kg/m2  SpO2 96% Physical Exam  Nursing note and vitals reviewed. Constitutional: He appears well-developed and well-nourished.  Sitting up in bed. Appears uncomfortable. ~100cc in emesis bag.   HENT:  Head: Normocephalic and atraumatic.  Eyes: Conjunctivae are normal. Right eye exhibits no discharge. Left eye exhibits no discharge.  Neck: Neck supple.  Cardiovascular: Normal rate, regular rhythm and  normal heart sounds.  Exam reveals no gallop and no friction rub.   No murmur heard. Pulmonary/Chest: Effort normal and breath sounds normal. No respiratory distress.  Abdominal: Soft. He exhibits no distension. There is tenderness.  Mild tenderness L flank/LUQ? No rebound or guarding.   Genitourinary:  L CVA tenderness  Musculoskeletal: He exhibits no edema and no tenderness.  Neurological: He is alert.  Skin: Skin is warm and dry.  Psychiatric: He has a normal mood and affect. His behavior is normal. Thought content normal.    ED Course  Procedures (including critical care time) Labs Review Labs Reviewed  CBC WITH DIFFERENTIAL - Abnormal; Notable for the following:    WBC 17.7 (*)    RDW 16.6 (*)    Platelets 407 (*)    Neutrophils Relative % 81 (*)    Neutro Abs 14.2 (*)    Monocytes Absolute 1.3 (*)    All other components within normal limits  BASIC METABOLIC PANEL - Abnormal; Notable for the following:    Glucose, Bld 136 (*)    GFR calc non Af Amer 73 (*)    GFR calc Af Amer 84 (*)    Anion gap 18 (*)    All other components within normal limits  URINALYSIS, ROUTINE W REFLEX MICROSCOPIC    Imaging Review Ct Abdomen Pelvis Wo Contrast  02/05/2014   CLINICAL DATA:  Left flank pain starting this morning  EXAM: CT ABDOMEN AND PELVIS WITHOUT CONTRAST  TECHNIQUE: Multidetector CT imaging of the abdomen and pelvis was performed following the standard protocol without IV contrast.  COMPARISON:  None.  FINDINGS: Lung bases shows small atelectasis or infiltrate in left lower lobe posteriorly. Moderate size hiatal hernia measures 5.7 cm.  Unenhanced liver shows no biliary ductal dilatation. No calcified gallstones are noted within gallbladder. Pancreas, spleen and adrenals are unremarkable. There is nonobstructive calcified calculus in midpole of the right kidney measures 7 mm. Nonobstructive calculus lower pole of the right kidney measures 3.2 mm. There is mild left hydronephrosis and  proximal left hydroureter. Mild left perinephric and periureteral stranding.  In axial image 50 there is calcified obstructive calculus in mid left ureter measures 5.6 mm at the level of L5-S1 disc space.  No right ureteral calculi.  Minimal right hydronephrosis.  No small bowel obstruction. No pericecal inflammation. The urinary bladder is unremarkable. Normal appendix. Prostate gland calcifications. Bilateral inguinal canal small hernia containing fat without evidence of acute complication. Osteopenia and degenerative changes thoracolumbar spine.  IMPRESSION: 1. There is mild left hydronephrosis and left hydroureter. Calcified obstructive calculus in mid left ureter measures 5.6 mm. 2. Right nonobstructive nephrolithiasis. Minimal right hydronephrosis. No calcified right ureteral calculi. 3. No small bowel obstruction. 4. Prostate gland calcifications. 5.  No calcified calculi are noted within urinary bladder. 6. Moderate size hiatal hernia. 7. Left basilar posterior atelectasis or infiltrate.   Electronically Signed   By: Lahoma Crocker M.D.   On: 02/05/2014 10:43     EKG Interpretation None      MDM   Final diagnoses:  Left ureteral calculus    73 year old male with acute onset left flank pain. Associated with nausea and vomiting. Clinically suspect ureteral colic. No history the same. Will CT to further evaluate. He is on Flomax for history of BPH. Symptomatic treatment of pain and nausea/vomiting. Basic labs and urinalysis.  CT with ureteral stone. Renal function ok. Symptoms tolerable. PRN pain meds/antiemetics. Return precautions discussed. Urology FU otherwise.     Virgel Manifold, MD 02/06/14 [phone]

## 2014-02-05 NOTE — ED Notes (Signed)
Pt presents with onset of L flank pain that began this morning.  +nausea and vomiting; pt denies any urinary symptoms

## 2014-02-05 NOTE — ED Notes (Signed)
Pt returned from radiology.

## 2014-02-05 NOTE — Discharge Instructions (Signed)
Ureteral Colic (Kidney Stones) °Ureteral colic is the result of a condition when kidney stones form inside the kidney. Once kidney stones are formed they may move into the tube that connects the kidney with the bladder (ureter). If this occurs, this condition may cause pain (colic) in the ureter.  °CAUSES  °Pain is caused by stone movement in the ureter and the obstruction caused by the stone. °SYMPTOMS  °The pain comes and goes as the ureter contracts around the stone. The pain is usually intense, sharp, and stabbing in character. The location of the pain may move as the stone moves through the ureter. When the stone is near the kidney the pain is usually located in the back and radiates to the belly (abdomen). When the stone is ready to pass into the bladder the pain is often located in the lower abdomen on the side the stone is located. At this location, the symptoms may mimic those of a urinary tract infection with urinary frequency. Once the stone is located here it often passes into the bladder and the pain disappears completely. °TREATMENT  °· Your caregiver will provide you with medicine for pain relief. °· You may require specialized follow-up X-rays. °· The absence of pain does not always mean that the stone has passed. It may have just stopped moving. If the urine remains completely obstructed, it can cause loss of kidney function or even complete destruction of the involved kidney. It is your responsibility and in your interest that X-rays and follow-ups as suggested by your caregiver are completed. Relief of pain without passage of the stone can be associated with severe damage to the kidney, including loss of kidney function on that side. °· If your stone does not pass on its own, additional measures may be taken by your caregiver to ensure its removal. °HOME CARE INSTRUCTIONS  °· Increase your fluid intake. Water is the preferred fluid since juices containing vitamin C may acidify the urine making it  less likely for certain stones (uric acid stones) to pass. °· Strain all urine. A strainer will be provided. Keep all particulate matter or stones for your caregiver to inspect. °· Take your pain medicine as directed. °· Make a follow-up appointment with your caregiver as directed. °· Remember that the goal is passage of your stone. The absence of pain does not mean the stone is gone. Follow your caregiver's instructions. °· Only take over-the-counter or prescription medicines for pain, discomfort, or fever as directed by your caregiver. °SEEK MEDICAL CARE IF:  °· Pain cannot be controlled with the prescribed medicine. °· You have a fever. °· Pain continues for longer than your caregiver advises it should. °· There is a change in the pain, and you develop chest discomfort or constant abdominal pain. °· You feel faint or pass out. °MAKE SURE YOU:  °· Understand these instructions. °· Will watch your condition. °· Will get help right away if you are not doing well or get worse. °Document Released: 05/03/2005 Document Revised: 11/18/2012 Document Reviewed: 01/18/2011 °[employer]® Patient Information ©2015 [employer]. This information is not intended to replace advice given to you by your health care provider. Make sure you discuss any questions you have with your health care provider. ° °

## 2014-02-25 ENCOUNTER — Other Ambulatory Visit: Payer: Self-pay | Admitting: Gastroenterology

## 2014-04-15 ENCOUNTER — Telehealth: Payer: Self-pay | Admitting: Internal Medicine

## 2014-04-15 MED ORDER — FLUTICASONE-SALMETEROL 100-50 MCG/DOSE IN AEPB: 1.0000 | INHALATION_SPRAY | Freq: Two times a day (BID) | RESPIRATORY_TRACT | Status: DC

## 2014-04-15 NOTE — Telephone Encounter (Signed)
Spoke with CY[facility] to print RX and give to patient for Pt Assistance program. Rx printed, signed by CY, and given to patient with his PA forms for [facility]. Nothing more needed at this time.

## 2014-04-27 ENCOUNTER — Ambulatory Visit (INDEPENDENT_AMBULATORY_CARE_PROVIDER_SITE_OTHER): Payer: Medicare Other | Admitting: Internal Medicine

## 2014-04-27 ENCOUNTER — Encounter: Payer: Self-pay | Admitting: Internal Medicine

## 2014-04-27 VITALS — BP 124/80 | HR 72 | Ht 70.0 in | Wt 184.0 lb

## 2014-04-27 DIAGNOSIS — J42 Unspecified chronic bronchitis: Secondary | ICD-10-CM

## 2014-04-27 DIAGNOSIS — J328 Other chronic sinusitis: Secondary | ICD-10-CM

## 2014-04-27 DIAGNOSIS — Z23 Encounter for immunization: Secondary | ICD-10-CM

## 2014-04-27 DIAGNOSIS — J209 Acute bronchitis, unspecified: Secondary | ICD-10-CM

## 2014-04-27 NOTE — Patient Instructions (Signed)
Pneumovax -23  Flu vax  Plan on getting Prevnar-13  pneumonia vaccine next year in late summer, before flu shot time  Please call for antibiotic if your chest doesn't clear up

## 2014-04-27 NOTE — Assessment & Plan Note (Signed)
Controlled w/o exacerbation during current bronchitis

## 2014-04-27 NOTE — Progress Notes (Signed)
Patient ID: [name], male    DOB: [birth year], 73 y.o.   MRN: [id number]  HPI 03/08/11- 73 year old male former smoker followed for chronic asthmatic bronchitis and rhinitis with history of chronic sinusitis and colitis. Last here August 16, 2010- CXR 08/16/10- NAD.  CXR 10/17/10 -preop for hernia repair- NAD, mild thoracic spine DGD. Now for past 2-3 weeks has had increased nasal congestion, maybe a little inner ear, light headedness on standing, and increased chest congestion with yellow sputum, no fever.  09/08/11- 73 year old male former smoker followed for chronic asthmatic bronchitis and rhinitis with history of chronic sinusitis and colitis. He did better with Cefdinir after last visit but never really cleared. When lying supine he notices nasal congestion with postnasal drip and cough productive of discolored sputum. Mild shortness of breath with exertion. Denies fever, nodes, headache, chest pain, palpitation.  10/06/11- 73 year old male former smoker followed for chronic asthmatic bronchitis and rhinitis with history of chronic sinusitis and colitis. He feels that he is back to baseline now. Some cough with occasional clear mucus but much better. Nasonex helps his nasal congestion and drainage.  04/22/12- 73 year old male former smoker followed for chronic asthmatic bronchitis and rhinitis with history of chronic sinusitis and colitis. Denies any new flare ups of wheezing or SOB; breathing about the same as last visit. Had a good summer despite the rain. Could not afford Daliresp.  04/28/13- 73 year old male former smoker followed for chronic asthmatic bronchitis and rhinitis with history of chronic sinusitis, complicated by ulcerative colitis. FOLLOWS FOR: cough-productive-drak greenish/brown in color; head congestion, chest congestion, slight wheezing. Once able to get phelgm up he feels little better. Head congestion bothers him more than chest congestion. No fever. On increased  prednisone for 6 months for flare of his ulcerative colitis. Now down to 20 mg daily. CXR 09/08/11-  Findings: Left lower lobe scarring is stable. Heart size is  normal. The lungs are otherwise clear. No pleural effusion. No  acute osseous abnormality. Left humeral bone anchors are in place.  Right AC joint degenerative change partly visualized.  IMPRESSION:  No acute cardiopulmonary process.  Original Report Authenticated By: Arline Asp, M.D.  08/28/13- 73 year old male former smoker followed for chronic asthmatic bronchitis and rhinitis with history of chronic sinusitis, complicated by ulcerative colitis. Had URI 2-3 wks. ago,increased sob,no energy,cough-lt. green, no fcs,occass. wheezing,denies cp or tightness. On chronic severe weight for ulcerative colitis. Advair Diskus is too expensive with his insurance .CXR 04/28/13 IMPRESSION:  COPD changes with minimal atelectasis versus scarring at left base.  No acute infiltrate.  Electronically Signed  By: Lavonia Dana M.D.  On: 04/28/2013 09:27  04/27/14- 73 year old male former smoker followed for chronic asthmatic bronchitis and rhinitis with history of chronic sinusitis, complicated by ulcerative colitis/ methotrexate/prednisone. FOLLOWS FOR: has congestion with productive cough-green in color for several days. Mild sore throat, no fever, mild hack cough progressed to som green sputum. Immunosuppressed so discussed abx, but we both want to wait. Discussed timing flu and pneumonia vaccine.  ROS-see HPI Constitutional:   No-   weight loss, night sweats, fevers, chills, fatigue, lassitude. HEENT:   No-  headaches, difficulty swallowing, tooth/dental problems, sore throat,       No-  sneezing, itching, ear ache,  +nasal congestion, post nasal drip,  CV:  No-   chest pain, orthopnea, PND, swelling in lower extremities, anasarca,  dizziness, palpitations Resp: +  shortness of breath with exertion or at rest.              +  productive  cough,  No non-productive cough,  No- coughing up of blood.              +  change in color of mucus.  No- wheezing.   Skin: No-   rash or lesions. GI:  No-   heartburn, indigestion, abdominal pain, nausea, vomiting,  GU:  MS:  No-   joint pain or swelling.  Neuro-     nothing unusual Psych:  No- change in mood or affect. No depression or anxiety.  No memory loss.    Objective:   Physical Exam General- Alert, Oriented, Affect-appropriate, Distress- none acute,  Skin- rash-none, lesions- none, excoriation- none Lymphadenopathy- none Head- atraumatic            Eyes- Gross vision intact, PERRLA, conjunctivae clear secretions            Ears- Hearing, canals- normal, no red or fluid            Nose- Clear, No-Septal dev, mucus, polyps, erosion, perforation             Throat- Mallampati II , mucosa clear , drainage- none, tonsils- atrophic Neck- flexible , trachea midline, no stridor , thyroid nl, carotid no bruit Chest - symmetrical excursion , unlabored           Heart/CV- RRR , no murmur , no gallop  , no rub, nl s1 s2                           - JVD- none , edema- none, stasis changes- none, varices- none           Lung- +mild wet rhonchi in bases, unlabored,   cough-none, unlabored , dullness-none,                                 rub- none,           Chest wall-  Abd-  Br/ Gen/ Rectal- Not done, not indicated Extrem- cyanosis- none, clubbing, none, atrophy- none, strength- nl Neuro- grossly intact to observation

## 2014-04-27 NOTE — Assessment & Plan Note (Signed)
Evolving acute exacerbation seems quite mild. He and I would both like to see how he does if we with-hold antibiotics for now. Discussed parameters, respecting his immunosuppression for colitis.  Plan Flu vax now, update pneumonia vaccine-23 now (8 yrs).  Plan to give Prevnar next year, before flu shot.

## 2014-06-07 ENCOUNTER — Other Ambulatory Visit: Payer: Self-pay | Admitting: Internal Medicine

## 2014-06-10 ENCOUNTER — Telehealth: Payer: Self-pay | Admitting: Internal Medicine

## 2014-06-10 MED ORDER — MONTELUKAST SODIUM 10 MG PO TABS: 10.0000 mg | ORAL_TABLET | Freq: Every day | ORAL | Status: DC

## 2014-06-10 NOTE — Telephone Encounter (Signed)
RX has been sent in. Nothing further needed 

## 2014-06-25 ENCOUNTER — Ambulatory Visit (INDEPENDENT_AMBULATORY_CARE_PROVIDER_SITE_OTHER): Payer: Medicare Other | Admitting: Sports Medicine

## 2014-06-25 VITALS — BP 120/72 | HR 71 | Temp 98.0°F | Resp 16 | Ht 69.5 in | Wt 182.4 lb

## 2014-06-25 DIAGNOSIS — M25561 Pain in right knee: Secondary | ICD-10-CM

## 2014-06-25 DIAGNOSIS — M25461 Effusion, right knee: Secondary | ICD-10-CM

## 2014-06-25 NOTE — Patient Instructions (Signed)
Knee Injection Joint injections are shots. Your caregiver will place a needle into your knee joint. The needle is used to put medicine into the joint. These shots can be used to help treat different painful knee conditions such as osteoarthritis, bursitis, local flare-ups of rheumatoid arthritis, and pseudogout. Anti-inflammatory medicines such as corticosteroids and anesthetics are the most common medicines used for joint and soft tissue injections.  PROCEDURE  The skin over the kneecap will be cleaned with an antiseptic solution.  Your caregiver will inject a small amount of a local anesthetic (a medicine like Novocaine) just under the skin in the area that was cleaned.  After the area becomes numb, a second injection is done. This second injection usually includes an anesthetic and an anti-inflammatory medicine called a steroid or cortisone. The needle is carefully placed in between the kneecap and the knee, and the medicine is injected into the joint space.  After the injection is done, the needle is removed. Your caregiver may place a bandage over the injection site. The whole procedure takes no more than a couple of minutes. BEFORE THE PROCEDURE  Wash all of the skin around the entire knee area. Try to remove any loose, scaling skin. There is no other specific preparation necessary unless advised otherwise by your caregiver. LET YOUR CAREGIVER KNOW ABOUT:   Allergies.  Medications taken including herbs, eye drops, over the counter medications, and creams.  Use of steroids (by mouth or creams).  Possible pregnancy, if applicable.  Previous problems with anesthetics or Novocaine.  History of blood clots (thrombophlebitis).  History of bleeding or blood problems.  Previous surgery.  Other health problems. RISKS AND COMPLICATIONS Side effects from cortisone shots are rare. They include:   Slight bruising of the skin.  Shrinkage of the normal fatty tissue under the skin where  the shot was given.  Increase in pain after the shot.  Infection.  Weakening of tendons or tendon rupture.  Allergic reaction to the medicine.  Diabetics may have a temporary increase in their blood sugar after a shot.  Cortisone can temporarily weaken the immune system. While receiving these shots, you should not get certain vaccines. Also, avoid contact with anyone who has chickenpox or measles. Especially if you have never had these diseases or have not been previously immunized. Your immune system may not be strong enough to fight off the infection while the cortisone is in your system. AFTER THE PROCEDURE   You can go home after the procedure.  You may need to put ice on the joint 15-20 minutes every 3 or 4 hours until the pain goes away.  You may need to put an elastic bandage on the joint. HOME CARE INSTRUCTIONS   Only take over-the-counter or prescription medicines for pain, discomfort, or fever as directed by your caregiver.  You should avoid stressing the joint. Unless advised otherwise, avoid activities that put a lot of pressure on a knee joint, such as:  Jogging.  Bicycling.  Recreational climbing.  Hiking.  Laying down and elevating the leg/knee above the level of your heart can help to minimize swelling. SEEK MEDICAL CARE IF:   You have repeated or worsening swelling.  There is drainage from the puncture area.  You develop red streaking that extends above or below the site where the needle was inserted. SEEK IMMEDIATE MEDICAL CARE IF:   You develop a fever.  You have pain that gets worse even though you are taking pain medicine.  The area is  red and warm, and you have trouble moving the joint. MAKE SURE YOU:   Understand these instructions.  Will watch your condition.  Will get help right away if you are not doing well or get worse. Document Released: 10/15/2006 Document Revised: 10/16/2011 Document Reviewed: 07/12/2007 [facility] Patient  Information 2015 [place], [place]. This information is not intended to replace advice given to you by your health care provider. Make sure you discuss any questions you have with your health care provider.

## 2014-06-25 NOTE — Progress Notes (Signed)
[name] - 73 y.o. male MRN [id number]  Date of birth: [birth year]  CC & HPI:  Chief Complaint  Patient presents with  . Knee Pain    R worse than L- intermittent for years, flared up over the last couple of days     Pt presents as new patient for evaluation of: Bilateral Knee Pain: Long-standing history, followed at [facility] by Dr. Ricki Rodriguez. Recommended for I lateral knee replacements but patient has been wanting to delay this. He's been unable to get in to see Dr. Ricki Rodriguez due  to scheduling difficulties and presents here today with acute flair up of right medial knee pain and swelling. No fevers, chills, locking, clicking, giving way.  ROS:  Per HPI.   HISTORY: Past Medical, Surgical, Social, and Family History Reviewed & Updated per EMR.  Pertinent Historical Findings include: History of bronchitis, ulcerative coli this has been titrated down to 5 mg of prednisone and methotrexate. History of left lower extremity lymphedema.   Historical Data Reviewed: No X-rays available   OBJECTIVE:  VS:   HT:5' 9.5" (176.5 cm)   WT:182 lb 6.4 oz (82.736 kg)  BMI:26.6          BP:120/72 mmHg  HR:71bpm  TEMP:98 F (36.7 C)(Oral)  RESP:95 %  PHYSICAL EXAM: GENERAL:  elderly Caucasian male. In no discomfort; no respiratory distress   PSYCH: alert and appropriate, good insight   NEURO: sensation is intact to light touch in bilateral lower charities   VASCULAR:  DP and PT pulses 2+/4.  No significant edema.    Bilateral knee EXAM: Appearance:  significant right effusion. Minimal left effusion. Significant osteoarthritic bossing bilateral knees.   Skin: No overlying erythema/ecchymosis.  Palpation: TTP over: Bilateral medial joint lines. No TTP over: Bilateral patellar tendons or lateral joint lines   Strength, ROM & OtherTests:  extensor mechanism intact Right R OM from 15 to 100 left range of motion from 5 to 105 Positive ballottement, stable to varus and valgus strain  bilaterally    ASSESSMENT: 1. Knee effusion, right   - patient with previously documented osteoarthritis. No significant eliciting event but has had a flareup of his pain and symptoms. Has been recommended for bony arthroplasty. Discussed options including bilateral aspiration and injection versus unilateral and would like to try to decrease steroid use given his chronic underlying use and will plan to proceed with unilateral procedure given the right side is markedly more symptomatic than the left.  PROCEDURE NOTE : Right Knee Arthrocentesis and Injection After discussing the risks, benefits and expected outcomes of the injection and all questions were reviewed and answered,  he wished to undergo the above named procedure.  Verbal consent was obtained. After an appropriate time out was taken the Right knee was sterilely prepped with alcohol and Betadine and skin wheal was made in the superior lateral port with 2 mL of 1% lidocaine. After a skin wheal was obtained and 18-gauge needle on a 60 mL syringe was used to aspirate 35 mL of thick straw-colored fluid. Syringe is were exchanged and the below medications were injected. : Meds:   3cc 1% lidocaine & 1cc 40m Depo-medrol A bandaid was applied to the area. This procedure was well tolerated and there were no complications.    PLAN: See problem based charting & AVS for additional documentation.  Injection today  Follow-up with orthopedics for further discussion of total knee arthroplasty > Return if symptoms worsen or fail to improve.

## 2014-10-26 ENCOUNTER — Ambulatory Visit (INDEPENDENT_AMBULATORY_CARE_PROVIDER_SITE_OTHER)
Admission: RE | Admit: 2014-10-26 | Discharge: 2014-10-26 | Disposition: A | Discharge status: Home / Self Care | Payer: Medicare Other | Source: Ambulatory Visit | Attending: Internal Medicine | Admitting: Internal Medicine

## 2014-10-26 ENCOUNTER — Encounter: Payer: Self-pay | Admitting: Internal Medicine

## 2014-10-26 ENCOUNTER — Ambulatory Visit: Payer: Medicare Other | Admitting: Internal Medicine

## 2014-10-26 VITALS — BP 122/66 | HR 73 | Ht 70.0 in | Wt 174.0 lb

## 2014-10-26 DIAGNOSIS — Z23 Encounter for immunization: Secondary | ICD-10-CM

## 2014-10-26 DIAGNOSIS — J449 Chronic obstructive pulmonary disease, unspecified: Secondary | ICD-10-CM

## 2014-10-26 MED ORDER — FLUTICASONE-SALMETEROL 100-50 MCG/DOSE IN AEPB: 1.0000 | INHALATION_SPRAY | Freq: Two times a day (BID) | RESPIRATORY_TRACT | Status: DC

## 2014-10-26 MED ORDER — ALBUTEROL SULFATE HFA 108 (90 BASE) MCG/ACT IN AERS: 2.0000 | INHALATION_SPRAY | Freq: Four times a day (QID) | RESPIRATORY_TRACT | Status: DC

## 2014-10-26 NOTE — Progress Notes (Signed)
Patient ID: [name], male    DOB: [birth year], 74 y.o.   MRN: [id number]  HPI 03/08/11- 74 year old male former smoker followed for chronic asthmatic bronchitis and rhinitis with history of chronic sinusitis and colitis. Last here August 16, 2010- CXR 08/16/10- NAD.  CXR 10/17/10 -preop for hernia repair- NAD, mild thoracic spine DGD. Now for past 2-3 weeks has had increased nasal congestion, maybe a little inner ear, light headedness on standing, and increased chest congestion with yellow sputum, no fever.  09/08/11- 74 year old male former smoker followed for chronic asthmatic bronchitis and rhinitis with history of chronic sinusitis and colitis. He did better with Cefdinir after last visit but never really cleared. When lying supine he notices nasal congestion with postnasal drip and cough productive of discolored sputum. Mild shortness of breath with exertion. Denies fever, nodes, headache, chest pain, palpitation.  10/06/11- 74 year old male former smoker followed for chronic asthmatic bronchitis and rhinitis with history of chronic sinusitis and colitis. He feels that he is back to baseline now. Some cough with occasional clear mucus but much better. Nasonex helps his nasal congestion and drainage.  04/22/12- 74 year old male former smoker followed for chronic asthmatic bronchitis and rhinitis with history of chronic sinusitis and colitis. Denies any new flare ups of wheezing or SOB; breathing about the same as last visit. Had a good summer despite the rain. Could not afford Daliresp.  04/28/13- 74 year old male former smoker followed for chronic asthmatic bronchitis and rhinitis with history of chronic sinusitis, complicated by ulcerative colitis. FOLLOWS FOR: cough-productive-drak greenish/brown in color; head congestion, chest congestion, slight wheezing. Once able to get phelgm up he feels little better. Head congestion bothers him more than chest congestion. No fever. On increased  prednisone for 6 months for flare of his ulcerative colitis. Now down to 20 mg daily. CXR 09/08/11-  Findings: Left lower lobe scarring is stable. Heart size is  normal. The lungs are otherwise clear. No pleural effusion. No  acute osseous abnormality. Left humeral bone anchors are in place.  Right AC joint degenerative change partly visualized.  IMPRESSION:  No acute cardiopulmonary process.  Original Report Authenticated By: Arline Asp, M.D.  08/28/13- 74 year old male former smoker followed for chronic asthmatic bronchitis and rhinitis with history of chronic sinusitis, complicated by ulcerative colitis. Had URI 2-3 wks. ago,increased sob,no energy,cough-lt. green, no fcs,occass. wheezing,denies cp or tightness. On chronic severe weight for ulcerative colitis. Advair Diskus is too expensive with his insurance .CXR 04/28/13 IMPRESSION:  COPD changes with minimal atelectasis versus scarring at left base.  No acute infiltrate.  Electronically Signed  By: Lavonia Dana M.D.  On: 04/28/2013 09:27  04/27/14- 74 year old male former smoker followed for chronic asthmatic bronchitis and rhinitis with history of chronic sinusitis, complicated by ulcerative colitis/ methotrexate/prednisone. FOLLOWS FOR: has congestion with productive cough-green in color for several days. Mild sore throat, no fever, mild hack cough progressed to som green sputum. Immunosuppressed so discussed abx, but we both want to wait. Discussed timing flu and pneumonia vaccine.  10/26/14-74 year old male former smoker followed for chronic asthmatic bronchitis and rhinitis with history of chronic sinusitis, complicated by ulcerative colitis/ methotrexate/prednisone. FOLLOW FOR:  Cough, PND.  no concerns.   ROS-see HPI Constitutional:   No-   weight loss, night sweats, fevers, chills, fatigue, lassitude. HEENT:   No-  headaches, difficulty swallowing, tooth/dental problems, sore throat,       No-  sneezing, itching, ear  ache,  +nasal congestion, post nasal drip,  CV:  No-   chest pain, orthopnea, PND, swelling in lower extremities, anasarca,  dizziness, palpitations Resp: +  shortness of breath with exertion or at rest.              +  productive cough,  No non-productive cough,  No- coughing up of blood.              +  change in color of mucus.  No- wheezing.   Skin: No-   rash or lesions. GI:  No-   heartburn, indigestion, abdominal pain, nausea, vomiting,  GU:  MS:  No-   joint pain or swelling.  Neuro-     nothing unusual Psych:  No- change in mood or affect. No depression or anxiety.  No memory loss.    Objective:   Physical Exam General- Alert, Oriented, Affect-appropriate, Distress- none acute,  Skin- rash-none, lesions- none, excoriation- none Lymphadenopathy- none Head- atraumatic            Eyes- Gross vision intact, PERRLA, conjunctivae clear secretions            Ears- Hearing, canals- normal, no red or fluid            Nose- Clear, No-Septal dev, mucus, polyps, erosion, perforation             Throat- Mallampati II , mucosa clear , drainage- none, tonsils- atrophic Neck- flexible , trachea midline, no stridor , thyroid nl, carotid no bruit Chest - symmetrical excursion , unlabored           Heart/CV- RRR , no murmur , no gallop  , no rub, nl s1 s2                           - JVD- none , edema- none, stasis changes- none, varices- none           Lung- +mild wet rhonchi in bases, unlabored,   cough-none, unlabored , dullness-none,                                 rub- none,           Chest wall-  Abd-  Br/ Gen/ Rectal- Not done, not indicated Extrem- cyanosis- none, clubbing, none, atrophy- none, strength- nl Neuro- grossly intact to observation

## 2014-10-26 NOTE — Patient Instructions (Signed)
Order- CXR  Dx chronic bronchitis without exacerbation  TDAP vaccination  Refill scripts for Advair and albuterol sent

## 2014-11-03 ENCOUNTER — Telehealth: Payer: Self-pay | Admitting: Internal Medicine

## 2014-11-03 NOTE — Progress Notes (Signed)
Quick Note:  lmtcb for pt. ______ 

## 2014-11-03 NOTE — Telephone Encounter (Signed)
Notes Recorded by Deneise Lever, MD on 10/26/2014 at 12:26 PM CXR- some chronic bronchitis changes with over-expansion and old scar. Nothing new or concerning. --- Pt is aware of results. Nothing further was needed.

## 2015-07-08 ENCOUNTER — Ambulatory Visit: Payer: Self-pay | Admitting: Orthopedic Surgery

## 2015-07-08 NOTE — Progress Notes (Addendum)
Preoperative surgical orders have been place into the Epic hospital system for [name] on 07/08/2015, 12:13 PM  by Mickel Crow for surgery on 08-13-2015.  Preop Total Knee orders including Experal, IV Tylenol, and IV Decadron as long as there are no contraindications to the above medications. Arlee Muslim, PA-C

## 2015-07-18 ENCOUNTER — Other Ambulatory Visit: Payer: Self-pay | Admitting: Internal Medicine

## 2015-07-26 ENCOUNTER — Other Ambulatory Visit: Payer: Self-pay

## 2015-07-26 MED ORDER — FLUTICASONE-SALMETEROL 100-50 MCG/DOSE IN AEPB: 1.0000 | INHALATION_SPRAY | Freq: Two times a day (BID) | RESPIRATORY_TRACT | Status: DC

## 2015-08-03 ENCOUNTER — Other Ambulatory Visit (HOSPITAL_COMMUNITY): Payer: Self-pay | Admitting: *Deleted

## 2015-08-03 NOTE — Progress Notes (Signed)
ekg and medical clearance note from dr badget both dated 07-21-15 on chart Chest xray 10-26-14 epic lov pulmonary dr young 10-26-14 epic

## 2015-08-03 NOTE — Patient Instructions (Addendum)
Gabryel ADONIS YIM  08/03/2015  Your procedure is scheduled on: 08-13-15  Report to [facility]  Entrance take [facility]  elevators to 3rd floor to  [place] at 515 AM.  Call this number if you have problems the morning of surgery [phone]   Remember: ONLY 1 PERSON MAY GO WITH YOU TO SHORT STAY TO GET  READY MORNING OF [place].  Do not eat food or drink liquids :After Midnight.     Take these medicines the morning of surgery with A SIP OF WATER: Albuterol Inhaler if needed and bring inhaler, Advair, Loratadine (Claritin), Singulair,  Prednisone                               You may not have any metal on your body including hair pins and              piercings  Do not wear jewelry, make-up, lotions, powders or perfumes, deodorant             Do not wear nail polish.  Do not shave  48 hours prior to surgery.              Men may shave face and neck.   Do not bring valuables to the hospital. [place].  Contacts, dentures or bridgework may not be worn into surgery.  Leave suitcase in the car. After surgery it may be brought to your room.     Patients discharged the day of surgery will not be allowed to drive home.  Name and phone number of your driver:  Special Instructions: N/A              Please read over the following fact sheets you were given: _____________________________________________________________________             [facility] - Preparing for Surgery Before surgery, you can play an important role.  Because skin is not sterile, your skin needs to be as free of germs as possible.  You can reduce the number of germs on your skin by washing with CHG (chlorahexidine gluconate) soap before surgery.  CHG is an antiseptic cleaner which kills germs and bonds with the skin to continue killing germs even after washing. Please DO NOT use if you have an allergy to CHG or antibacterial soaps.   If your skin becomes reddened/irritated stop using the CHG and inform your nurse when you arrive at Short Stay. Do not shave (including legs and underarms) for at least 48 hours prior to the first CHG shower.  You may shave your face/neck. Please follow these instructions carefully:  1.  Shower with CHG Soap the night before surgery and the  morning of Surgery.  2.  If you choose to wash your hair, wash your hair first as usual with your  normal  shampoo.  3.  After you shampoo, rinse your hair and body thoroughly to remove the  shampoo.                           4.  Use CHG as you would any other liquid soap.  You can apply chg directly  to the skin and wash  Gently with a scrungie or clean washcloth.  5.  Apply the CHG Soap to your body ONLY FROM THE NECK DOWN.   Do not use on face/ open                           Wound or open sores. Avoid contact with eyes, ears mouth and genitals (private parts).                       Wash face,  Genitals (private parts) with your normal soap.             6.  Wash thoroughly, paying special attention to the area where your surgery  will be performed.  7.  Thoroughly rinse your body with warm water from the neck down.  8.  DO NOT shower/wash with your normal soap after using and rinsing off  the CHG Soap.                9.  Pat yourself dry with a clean towel.            10.  Wear clean pajamas.            11.  Place clean sheets on your bed the night of your first shower and do not  sleep with pets. Day of Surgery : Do not apply any lotions/deodorants the morning of surgery.  Please wear clean clothes to the hospital/surgery center.  FAILURE TO FOLLOW THESE INSTRUCTIONS MAY RESULT IN THE CANCELLATION OF YOUR SURGERY PATIENT SIGNATURE_________________________________  NURSE SIGNATURE__________________________________  ________________________________________________________________________   Adam Phenix  An incentive  spirometer is a tool that can help keep your lungs clear and active. This tool measures how well you are filling your lungs with each breath. Taking long deep breaths may help reverse or decrease the chance of developing breathing (pulmonary) problems (especially infection) following:  A long period of time when you are unable to move or be active. BEFORE THE PROCEDURE   If the spirometer includes an indicator to show your best effort, your nurse or respiratory therapist will set it to a desired goal.  If possible, sit up straight or lean slightly forward. Try not to slouch.  Hold the incentive spirometer in an upright position. INSTRUCTIONS FOR USE   Sit on the edge of your bed if possible, or sit up as far as you can in bed or on a chair.  Hold the incentive spirometer in an upright position.  Breathe out normally.  Place the mouthpiece in your mouth and seal your lips tightly around it.  Breathe in slowly and as deeply as possible, raising the piston or the ball toward the top of the column.  Hold your breath for 3-5 seconds or for as long as possible. Allow the piston or ball to fall to the bottom of the column.  Remove the mouthpiece from your mouth and breathe out normally.  Rest for a few seconds and repeat Steps 1 through 7 at least 10 times every 1-2 hours when you are awake. Take your time and take a few normal breaths between deep breaths.  The spirometer may include an indicator to show your best effort. Use the indicator as a goal to work toward during each repetition.  After each set of 10 deep breaths, practice coughing to be sure your lungs are clear. If you have an incision (the cut made at the time of surgery),  support your incision when coughing by placing a pillow or rolled up towels firmly against it. Once you are able to get out of bed, walk around indoors and cough well. You may stop using the incentive spirometer when instructed by your caregiver.  RISKS AND  COMPLICATIONS  Take your time so you do not get dizzy or light-headed.  If you are in pain, you may need to take or ask for pain medication before doing incentive spirometry. It is harder to take a deep breath if you are having pain. AFTER USE  Rest and breathe slowly and easily.  It can be helpful to keep track of a log of your progress. Your caregiver can provide you with a simple table to help with this. If you are using the spirometer at home, follow these instructions: [place] IF:   You are having difficultly using the spirometer.  You have trouble using the spirometer as often as instructed.  Your pain medication is not giving enough relief while using the spirometer.  You develop fever of 100.5 F (38.1 C) or higher. SEEK IMMEDIATE MEDICAL CARE IF:   You cough up bloody sputum that had not been present before.  You develop fever of 102 F (38.9 C) or greater.  You develop worsening pain at or near the incision site. MAKE SURE YOU:   Understand these instructions.  Will watch your condition.  Will get help right away if you are not doing well or get worse. Document Released: 12/04/2006 Document Revised: 10/16/2011 Document Reviewed: 02/04/2007 Exit[employer] Patient Information 2014 Exit[employer], [place].   ________________________________________________________________________  WHAT IS A BLOOD TRANSFUSION? Blood Transfusion Information  A transfusion is the replacement of blood or some of its parts. Blood is made up of multiple cells which provide different functions.  Red blood cells carry oxygen and are used for blood loss replacement.  White blood cells fight against infection.  Platelets control bleeding.  Plasma helps clot blood.  Other blood products are available for specialized needs, such as hemophilia or other clotting disorders. BEFORE THE TRANSFUSION  Who gives blood for transfusions?   Healthy volunteers who are fully evaluated to make sure  their blood is safe. This is blood bank blood. Transfusion therapy is the safest it has ever been in the practice of medicine. Before blood is taken from a donor, a complete history is taken to make sure that person has no history of diseases nor engages in risky social behavior (examples are intravenous drug use or sexual activity with multiple partners). The donor's travel history is screened to minimize risk of transmitting infections, such as malaria. The donated blood is tested for signs of infectious diseases, such as HIV and hepatitis. The blood is then tested to be sure it is compatible with you in order to minimize the chance of a transfusion reaction. If you or a relative donates blood, this is often done in anticipation of surgery and is not appropriate for emergency situations. It takes many days to process the donated blood. RISKS AND COMPLICATIONS Although transfusion therapy is very safe and saves many lives, the main dangers of transfusion include:   Getting an infectious disease.  Developing a transfusion reaction. This is an allergic reaction to something in the blood you were given. Every precaution is taken to prevent this. The decision to have a blood transfusion has been considered carefully by your caregiver before blood is given. Blood is not given unless the benefits outweigh the risks. AFTER THE TRANSFUSION  Right after receiving a blood transfusion, you will usually feel much better and more energetic. This is especially true if your red blood cells have gotten low (anemic). The transfusion raises the level of the red blood cells which carry oxygen, and this usually causes an energy increase.  The nurse administering the transfusion will monitor you carefully for complications. HOME CARE INSTRUCTIONS  No special instructions are needed after a transfusion. You may find your energy is better. Speak with your caregiver about any limitations on activity for underlying diseases  you may have. SEEK MEDICAL CARE IF:   Your condition is not improving after your transfusion.  You develop redness or irritation at the intravenous (IV) site. SEEK IMMEDIATE MEDICAL CARE IF:  Any of the following symptoms occur over the next 12 hours:  Shaking chills.  You have a temperature by mouth above 102 F (38.9 C), not controlled by medicine.  Chest, back, or muscle pain.  People around you feel you are not acting correctly or are confused.  Shortness of breath or difficulty breathing.  Dizziness and fainting.  You get a rash or develop hives.  You have a decrease in urine output.  Your urine turns a dark color or changes to pink, red, or brown. Any of the following symptoms occur over the next 10 days:  You have a temperature by mouth above 102 F (38.9 C), not controlled by medicine.  Shortness of breath.  Weakness after normal activity.  The white part of the eye turns yellow (jaundice).  You have a decrease in the amount of urine or are urinating less often.  Your urine turns a dark color or changes to pink, red, or brown. Document Released: 07/21/2000 Document Revised: 10/16/2011 Document Reviewed: 03/09/2008 [facility] Patient Information 2014 [place], [place].  _______________________________________________________________________

## 2015-08-05 ENCOUNTER — Encounter (HOSPITAL_COMMUNITY)
Admission: RE | Admit: 2015-08-05 | Discharge: 2015-08-05 | Disposition: A | Discharge status: Home / Self Care | Payer: Medicare Other | Source: Ambulatory Visit | Attending: Orthopedic Surgery | Admitting: Orthopedic Surgery

## 2015-08-05 ENCOUNTER — Encounter (HOSPITAL_COMMUNITY): Payer: Self-pay

## 2015-08-05 DIAGNOSIS — M179 Osteoarthritis of knee, unspecified: Secondary | ICD-10-CM | POA: Diagnosis not present

## 2015-08-05 DIAGNOSIS — Z01818 Encounter for other preprocedural examination: Secondary | ICD-10-CM | POA: Insufficient documentation

## 2015-08-05 LAB — CBC
HCT: 38.5 % — ABNORMAL LOW (ref 39.0–52.0)
Hemoglobin: 12.5 g/dL — ABNORMAL LOW (ref 13.0–17.0)
MCH: 27.1 pg (ref 26.0–34.0)
MCHC: 32.5 g/dL (ref 30.0–36.0)
MCV: 83.5 fL (ref 78.0–100.0)
PLATELETS: 367 10*3/uL (ref 150–400)
RBC: 4.61 MIL/uL (ref 4.22–5.81)
RDW: 16 % — AB (ref 11.5–15.5)
WBC: 11.3 10*3/uL — AB (ref 4.0–10.5)

## 2015-08-05 LAB — URINE MICROSCOPIC-ADD ON

## 2015-08-05 LAB — COMPREHENSIVE METABOLIC PANEL
ALT: 21 U/L (ref 17–63)
AST: 20 U/L (ref 15–41)
Albumin: 3.5 g/dL (ref 3.5–5.0)
Alkaline Phosphatase: 74 U/L (ref 38–126)
Anion gap: 9 (ref 5–15)
BUN: 18 mg/dL (ref 6–20)
CHLORIDE: 103 mmol/L (ref 101–111)
CO2: 25 mmol/L (ref 22–32)
CREATININE: 1.06 mg/dL (ref 0.61–1.24)
Calcium: 9.4 mg/dL (ref 8.9–10.3)
Glucose, Bld: 95 mg/dL (ref 65–99)
POTASSIUM: 4.1 mmol/L (ref 3.5–5.1)
SODIUM: 137 mmol/L (ref 135–145)
Total Bilirubin: 0.4 mg/dL (ref 0.3–1.2)
Total Protein: 6.7 g/dL (ref 6.5–8.1)

## 2015-08-05 LAB — PROTIME-INR
INR: 1.06 (ref 0.00–1.49)
PROTHROMBIN TIME: 14 s (ref 11.6–15.2)

## 2015-08-05 LAB — TYPE AND SCREEN
ABO/RH(D): A NEG
ANTIBODY SCREEN: NEGATIVE

## 2015-08-05 LAB — URINALYSIS, ROUTINE W REFLEX MICROSCOPIC
BILIRUBIN URINE: NEGATIVE
GLUCOSE, UA: NEGATIVE mg/dL
HGB URINE DIPSTICK: NEGATIVE
KETONES UR: NEGATIVE mg/dL
Nitrite: NEGATIVE
PH: 5.5 (ref 5.0–8.0)
PROTEIN: NEGATIVE mg/dL
Specific Gravity, Urine: 1.017 (ref 1.005–1.030)

## 2015-08-05 LAB — APTT: aPTT: 34 seconds (ref 24–37)

## 2015-08-05 LAB — SURGICAL PCR SCREEN
MRSA, PCR: NEGATIVE
Staphylococcus aureus: NEGATIVE

## 2015-08-05 LAB — ABO/RH: ABO/RH(D): A NEG

## 2015-08-05 NOTE — Progress Notes (Signed)
Micro, ua results faxed by epic to dr Wynelle Link

## 2015-08-06 NOTE — Progress Notes (Signed)
Fax received from dr Wynelle Link, no action on ua results fax placed on chart

## 2015-08-10 NOTE — H&P (Signed)
TOTAL KNEE ADMISSION H&P  Patient is being admitted for right total knee arthroplasty.  Subjective:  Chief Complaint:right knee pain.  HPI: [name], 75 y.o. male, has a history of pain and functional disability in the right knee due to arthritis and has failed non-surgical conservative treatments for greater than 12 weeks to includeNSAID's and/or analgesics, corticosteriod injections, viscosupplementation injections and activity modification.  Onset of symptoms was gradual, starting 9 years ago with gradually worsening course since that time. The patient noted no past surgery on the right knee(s).  Patient currently rates pain in the right knee(s) at 8 out of 10 with activity. Patient has night pain, worsening of pain with activity and weight bearing, pain that interferes with activities of daily living, pain with passive range of motion, crepitus and joint swelling.  Patient has evidence of periarticular osteophytes and joint space narrowing by imaging studies. There is no active infection.  Patient Active Problem List   Diagnosis Date Noted  . Chronic bronchitis with acute exacerbation ([place]) 09/23/2013  . Atherosclerosis of native arteries of the extremities with intermittent claudication 06/18/2012  . Pain in limb 06/18/2012  . Claudication ([place]) 05/30/2012  . Frequency of urination 03/13/2012  . General medical examination 05/29/2011  . Myalgia 04/04/2011  . DERMATOPHYTOSIS OF NAIL 09/22/2010  . INGUINAL HERNIA, LEFT 09/22/2010  . DECREASED HEARING 12/23/2009  . RHINITIS 12/23/2009  . UNSPECIFIED DISORDER OF SKIN&SUBCUTANEOUS TISSUE 12/23/2009  . HYPERLIPIDEMIA 09/13/2009  . ASTHMA 08/29/2009  . LYMPHEDEMA 08/27/2009  . ELEVATED BLOOD PRESSURE WITHOUT DIAGNOSIS OF HYPERTENSION 08/27/2009  . BENIGN PROSTATIC HYPERTROPHY, MILD, HX OF 08/27/2009  . OTHER CHRONIC SINUSITIS 08/09/2007  . BRONCHITIS, CHRONIC 08/09/2007  . HIATAL HERNIA 08/09/2007  . COLITIS 08/09/2007   Past  Medical History  Diagnosis Date  . Ulcerative colitis   . Asthma   . Chronic bronchitis   . Sinusitis   . Peripheral edema   . Hearing loss in left ear   . COPD (chronic obstructive pulmonary disease) [facility])     Past Surgical History  Procedure Laterality Date  . Rotator cuff repair      bilateral shoulders   . Hernia repair  2013    x 3 hernias repaired  . Colonscopy      several with polys removed      Current outpatient prescriptions:  .  acetaminophen (TYLENOL) 500 MG tablet, Take 1,000 mg by mouth every 8 (eight) hours as needed for moderate pain. , Disp: , Rfl:  .  albuterol (PROVENTIL HFA;VENTOLIN HFA) 108 (90 BASE) MCG/ACT inhaler, Inhale 2 puffs into the lungs 4 (four) times daily. (Patient taking differently: Inhale 2 puffs into the lungs every 4 (four) hours as needed for wheezing or shortness of breath. ), Disp: 3 Inhaler, Rfl: 3 .  aspirin EC 81 MG tablet, Take 1 tablet (81 mg total) by mouth daily., Disp: 30 tablet, Rfl: 6 .  Calcium Carb-Cholecalciferol (CALCIUM 600 + D PO), Take 1 tablet by mouth daily., Disp: , Rfl:  .  Co-Enzyme Q10 100 MG CAPS, Take 1 capsule by mouth daily., Disp: , Rfl:  .  Cyanocobalamin (VITAMIN B-12 PO), Take 1 tablet by mouth 2 (two) times a week. On Monday & Thursday, Disp: , Rfl:  .  Fluticasone-Salmeterol (ADVAIR) 100-50 MCG/DOSE AEPB, Inhale 1 puff into the lungs 2 (two) times daily., Disp: 3 each, Rfl: 3 .  folic acid (FOLVITE) 1 MG tablet, Take 1 mg by mouth as directed. Every day EXCEPT Sunday, Disp: ,  Rfl:  .  guaiFENesin (MUCINEX) 600 MG 12 hr tablet, Take 600 mg by mouth daily. , Disp: , Rfl:  .  loratadine (CLARITIN) 10 MG tablet, Take 10 mg by mouth daily.  , Disp: , Rfl:  .  methotrexate (RHEUMATREX) 2.5 MG tablet, TAKE 10 TABLETS (25 MG) BY MOUTH WEEKLY on SUNDAYS, Disp: , Rfl: 3 .  montelukast (SINGULAIR) 10 MG tablet, TAKE 1 TABLET BY MOUTH ONCE DAILY (Patient taking differently: TAKE 1 TABLET BY MOUTH EVERY EVENING), Disp: 90  tablet, Rfl: 3 .  Omega-3 Fatty Acids (FISH OIL PO), Take 1 capsule by mouth daily., Disp: , Rfl:  .  pantoprazole (PROTONIX) 40 MG tablet, Take 40 mg by mouth every evening. , Disp: , Rfl:  .  predniSONE (DELTASONE) 5 MG tablet, Take 2.5 mg by mouth daily with breakfast., Disp: , Rfl:  .  simvastatin (ZOCOR) 20 MG tablet, Take 20 mg by mouth daily at 6 PM. , Disp: , Rfl:  .  Tamsulosin HCl (FLOMAX) 0.4 MG CAPS, Take 1 capsule (0.4 mg total) by mouth daily. (Patient taking differently: Take 0.8 mg by mouth daily after supper. ), Disp: 90 capsule, Rfl: 1  Allergies  Allergen Reactions  . Lipitor [Atorvastatin Calcium]     Muscle ache  . Moxifloxacin     REACTION: rash  . Penicillins Other (See Comments)    Patient has never had reaction, but Dr Glynn Octave told him not to take due to possible cross-sensitivity with mold allergy  . Molds & Smuts Rash    Social History  Substance Use Topics  . Smoking status: Former Smoker -- 2.00 packs/day for 15 years    Types: Cigarettes    Quit date: 08/08/1971  . Smokeless tobacco: Former Systems developer    Quit date: 08/08/1971  . Alcohol Use: No    Family History  Problem Relation Age of Onset  . Heart disease Mother     MI  . Hyperlipidemia Mother   . Other Mother     varicose veins  . Heart attack Mother   . COPD Sister   . Hyperlipidemia Father   . Hypertension Father      Review of Systems  Constitutional: Positive for malaise/fatigue. Negative for fever, chills, weight loss and diaphoresis.  HENT: Positive for hearing loss and tinnitus. Negative for congestion, ear discharge, ear pain, nosebleeds and sore throat.   Respiratory: Positive for cough, shortness of breath and wheezing. Negative for hemoptysis, sputum production and stridor.        SOB with exertion  Cardiovascular: Negative.   Gastrointestinal: Positive for heartburn, diarrhea and blood in stool. Negative for nausea, vomiting, abdominal pain, constipation and melena.   Genitourinary: Positive for frequency. Negative for dysuria, hematuria and flank pain.  Musculoskeletal: Positive for myalgias and joint pain. Negative for back pain, falls and neck pain.       Right knee pain  Skin: Negative.   Neurological: Negative.  Negative for weakness and headaches.  Endo/Heme/Allergies: Negative.   Psychiatric/Behavioral: Negative.     Objective:  Physical Exam  Constitutional: He is oriented to person, place, and time. He appears well-developed.  HENT:  Head: Normocephalic and atraumatic.  Right Ear: External ear normal.  Left Ear: External ear normal.  Nose: Nose normal.  Mouth/Throat: Oropharynx is clear and moist.  Eyes: Conjunctivae and EOM are normal.  Neck: Normal range of motion. Neck supple.  Cardiovascular: Normal rate, regular rhythm, normal heart sounds and intact distal pulses.   No  murmur heard. Respiratory: Effort normal. No respiratory distress. He has wheezes in the left upper field.  GI: Soft. Bowel sounds are normal. He exhibits no distension. There is no tenderness.  Musculoskeletal:       Right hip: Normal.       Left hip: Normal.       Left knee: Normal.  Both hips show normal range of motion with no discomfort. His left knee shows no effusion. Range of motion of the left knee is about 5- to 125. There is slight crepitus on range of motion and some tenderness in the medial greater than the lateral with no instability. Right knee shows varus deformity and range about 5- to 125, moderate crepitus on range of motion, and tender medial greater than lateral, but no instability noted.   Neurological: He is alert and oriented to person, place, and time. He has normal strength. No sensory deficit.  Skin: No rash noted. He is not diaphoretic. No erythema.  Psychiatric: He has a normal mood and affect. His behavior is normal.    Vitals  Weight: 165 lb Height: 70in Body Surface Area: 1.92 m Body Mass Index: 23.67 kg/m  Pulse: 72  (Regular)  BP: 124/70 (Sitting, Left Arm, Standard)  Imaging Review Plain radiographs demonstrate severe degenerative joint disease of the right knee(s). The overall alignment ismild varus. The bone quality appears to be good for age and reported activity level.  Assessment/Plan:  End stage primary osteoarthritis, right knee   The patient history, physical examination, clinical judgment of the provider and imaging studies are consistent with end stage degenerative joint disease of the right knee(s) and total knee arthroplasty is deemed medically necessary. The treatment options including medical management, injection therapy arthroscopy and arthroplasty were discussed at length. The risks and benefits of total knee arthroplasty were presented and reviewed. The risks due to aseptic loosening, infection, stiffness, patella tracking problems, thromboembolic complications and other imponderables were discussed. The patient acknowledged the explanation, agreed to proceed with the plan and consent was signed. Patient is being admitted for inpatient treatment for surgery, pain control, PT, OT, prophylactic antibiotics, VTE prophylaxis, progressive ambulation and ADL's and discharge planning. The patient is planning to be discharged home with home health services    PCP: Dr. Melford Aase Needs walker, cane, and shower chair  [place] Jourdain, [place]

## 2015-08-13 ENCOUNTER — Inpatient Hospital Stay (HOSPITAL_COMMUNITY)
Admission: RE | Admit: 2015-08-13 | Discharge: 2015-08-16 | DRG: 470 | Disposition: A | Discharge status: Home Health | Payer: Medicare Other | Source: Ambulatory Visit | Attending: Orthopedic Surgery | Admitting: Orthopedic Surgery

## 2015-08-13 ENCOUNTER — Inpatient Hospital Stay (HOSPITAL_COMMUNITY): Payer: Medicare Other | Admitting: Certified Registered Nurse Anesthetist

## 2015-08-13 ENCOUNTER — Encounter (HOSPITAL_COMMUNITY): Payer: Self-pay | Admitting: *Deleted

## 2015-08-13 ENCOUNTER — Encounter (HOSPITAL_COMMUNITY)
Admission: RE | Disposition: A | Discharge status: Home Health | Payer: Self-pay | Source: Ambulatory Visit | Attending: Orthopedic Surgery

## 2015-08-13 DIAGNOSIS — K5190 Ulcerative colitis, unspecified, without complications: Secondary | ICD-10-CM | POA: Diagnosis present

## 2015-08-13 DIAGNOSIS — Z880 Allergy status to penicillin: Secondary | ICD-10-CM

## 2015-08-13 DIAGNOSIS — J45909 Unspecified asthma, uncomplicated: Secondary | ICD-10-CM | POA: Diagnosis present

## 2015-08-13 DIAGNOSIS — Z7982 Long term (current) use of aspirin: Secondary | ICD-10-CM

## 2015-08-13 DIAGNOSIS — I739 Peripheral vascular disease, unspecified: Secondary | ICD-10-CM | POA: Diagnosis present

## 2015-08-13 DIAGNOSIS — H9192 Unspecified hearing loss, left ear: Secondary | ICD-10-CM | POA: Diagnosis present

## 2015-08-13 DIAGNOSIS — Z87891 Personal history of nicotine dependence: Secondary | ICD-10-CM | POA: Diagnosis not present

## 2015-08-13 DIAGNOSIS — Z7952 Long term (current) use of systemic steroids: Secondary | ICD-10-CM

## 2015-08-13 DIAGNOSIS — Z881 Allergy status to other antibiotic agents status: Secondary | ICD-10-CM

## 2015-08-13 DIAGNOSIS — J449 Chronic obstructive pulmonary disease, unspecified: Secondary | ICD-10-CM | POA: Diagnosis present

## 2015-08-13 DIAGNOSIS — Z888 Allergy status to other drugs, medicaments and biological substances status: Secondary | ICD-10-CM

## 2015-08-13 DIAGNOSIS — J329 Chronic sinusitis, unspecified: Secondary | ICD-10-CM | POA: Diagnosis present

## 2015-08-13 DIAGNOSIS — M1711 Unilateral primary osteoarthritis, right knee: Secondary | ICD-10-CM | POA: Diagnosis present

## 2015-08-13 DIAGNOSIS — Z79899 Other long term (current) drug therapy: Secondary | ICD-10-CM

## 2015-08-13 DIAGNOSIS — E785 Hyperlipidemia, unspecified: Secondary | ICD-10-CM | POA: Diagnosis present

## 2015-08-13 DIAGNOSIS — Z8711 Personal history of peptic ulcer disease: Secondary | ICD-10-CM | POA: Diagnosis not present

## 2015-08-13 DIAGNOSIS — M1712 Unilateral primary osteoarthritis, left knee: Secondary | ICD-10-CM | POA: Diagnosis present

## 2015-08-13 DIAGNOSIS — Z825 Family history of asthma and other chronic lower respiratory diseases: Secondary | ICD-10-CM | POA: Diagnosis not present

## 2015-08-13 DIAGNOSIS — M25761 Osteophyte, right knee: Secondary | ICD-10-CM | POA: Diagnosis present

## 2015-08-13 DIAGNOSIS — J310 Chronic rhinitis: Secondary | ICD-10-CM | POA: Diagnosis present

## 2015-08-13 DIAGNOSIS — M25561 Pain in right knee: Secondary | ICD-10-CM | POA: Diagnosis present

## 2015-08-13 DIAGNOSIS — Z8249 Family history of ischemic heart disease and other diseases of the circulatory system: Secondary | ICD-10-CM | POA: Diagnosis not present

## 2015-08-13 DIAGNOSIS — M179 Osteoarthritis of knee, unspecified: Secondary | ICD-10-CM | POA: Diagnosis present

## 2015-08-13 DIAGNOSIS — M1710 Unilateral primary osteoarthritis, unspecified knee: Secondary | ICD-10-CM | POA: Diagnosis present

## 2015-08-13 DIAGNOSIS — Z01812 Encounter for preprocedural laboratory examination: Secondary | ICD-10-CM | POA: Diagnosis not present

## 2015-08-13 HISTORY — PX: TOTAL KNEE ARTHROPLASTY: SHX125

## 2015-08-13 SURGERY — ARTHROPLASTY, KNEE, TOTAL
Anesthesia: Spinal | Site: Knee | Laterality: Right

## 2015-08-13 MED ORDER — MOMETASONE FURO-FORMOTEROL FUM 100-5 MCG/ACT IN AERO
2.0000 | INHALATION_SPRAY | Freq: Two times a day (BID) | RESPIRATORY_TRACT | Status: DC
  Administered 2015-08-13 – 2015-08-16 (×6): 2 via RESPIRATORY_TRACT
  Filled 2015-08-13: qty 8.8

## 2015-08-13 MED ORDER — SODIUM CHLORIDE 0.9 % IJ SOLN
INTRAMUSCULAR | Status: AC
  Filled 2015-08-13: qty 50

## 2015-08-13 MED ORDER — BISACODYL 10 MG RE SUPP: 10.0000 mg | Freq: Every day | RECTAL | Status: DC | PRN

## 2015-08-13 MED ORDER — STERILE WATER FOR IRRIGATION IR SOLN
Status: DC | PRN
  Administered 2015-08-13: 1000 mL

## 2015-08-13 MED ORDER — BUPIVACAINE IN DEXTROSE 0.75-8.25 % IT SOLN
INTRATHECAL | Status: DC | PRN
  Administered 2015-08-13: 2 mL via INTRATHECAL

## 2015-08-13 MED ORDER — POLYETHYLENE GLYCOL 3350 17 G PO PACK: 17.0000 g | PACK | Freq: Every day | ORAL | Status: DC | PRN

## 2015-08-13 MED ORDER — PROPOFOL 500 MG/50ML IV EMUL
INTRAVENOUS | Status: DC | PRN
  Administered 2015-08-13: 75 ug/kg/min via INTRAVENOUS

## 2015-08-13 MED ORDER — BUPIVACAINE LIPOSOME 1.3 % IJ SUSP
INTRAMUSCULAR | Status: DC | PRN
  Administered 2015-08-13: 20 mL

## 2015-08-13 MED ORDER — SODIUM CHLORIDE 0.9 % IV SOLN
1000.0000 mg | INTRAVENOUS | Status: AC
  Administered 2015-08-13: 1000 mg via INTRAVENOUS
  Filled 2015-08-13: qty 10

## 2015-08-13 MED ORDER — METHOCARBAMOL 500 MG PO TABS
500.0000 mg | ORAL_TABLET | Freq: Four times a day (QID) | ORAL | Status: DC | PRN
  Administered 2015-08-13 – 2015-08-16 (×9): 500 mg via ORAL
  Filled 2015-08-13 (×9): qty 1

## 2015-08-13 MED ORDER — MIDAZOLAM HCL 5 MG/5ML IJ SOLN
INTRAMUSCULAR | Status: DC | PRN
  Administered 2015-08-13: 2 mg via INTRAVENOUS

## 2015-08-13 MED ORDER — SODIUM CHLORIDE 0.9 % IV SOLN: INTRAVENOUS | Status: DC

## 2015-08-13 MED ORDER — CEFAZOLIN SODIUM-DEXTROSE 2-3 GM-% IV SOLR
2.0000 g | INTRAVENOUS | Status: AC
  Administered 2015-08-13: 2 g via INTRAVENOUS

## 2015-08-13 MED ORDER — PHENOL 1.4 % MT LIQD
1.0000 | OROMUCOSAL | Status: DC | PRN
  Filled 2015-08-13: qty 177

## 2015-08-13 MED ORDER — TAMSULOSIN HCL 0.4 MG PO CAPS
0.8000 mg | ORAL_CAPSULE | Freq: Every day | ORAL | Status: DC
  Administered 2015-08-13 – 2015-08-15 (×3): 0.8 mg via ORAL
  Filled 2015-08-13 (×4): qty 2

## 2015-08-13 MED ORDER — PREDNISONE 5 MG PO TABS
5.0000 mg | ORAL_TABLET | Freq: Every day | ORAL | Status: DC
Start: 2015-08-15 — End: 2015-08-16
  Administered 2015-08-15 – 2015-08-16 (×2): 5 mg via ORAL
  Filled 2015-08-13 (×4): qty 1

## 2015-08-13 MED ORDER — SODIUM CHLORIDE 0.9 % IR SOLN
Status: DC | PRN
  Administered 2015-08-13: 1000 mL

## 2015-08-13 MED ORDER — ONDANSETRON HCL 4 MG/2ML IJ SOLN
INTRAMUSCULAR | Status: DC | PRN
  Administered 2015-08-13: 4 mg via INTRAVENOUS

## 2015-08-13 MED ORDER — RIVAROXABAN 10 MG PO TABS
10.0000 mg | ORAL_TABLET | Freq: Every day | ORAL | Status: DC
  Administered 2015-08-14 – 2015-08-16 (×3): 10 mg via ORAL
  Filled 2015-08-13 (×5): qty 1

## 2015-08-13 MED ORDER — ACETAMINOPHEN 10 MG/ML IV SOLN
1000.0000 mg | Freq: Once | INTRAVENOUS | Status: AC
  Administered 2015-08-13: 1000 mg via INTRAVENOUS
  Filled 2015-08-13: qty 100

## 2015-08-13 MED ORDER — EPHEDRINE SULFATE 50 MG/ML IJ SOLN
INTRAMUSCULAR | Status: AC
  Filled 2015-08-13: qty 2

## 2015-08-13 MED ORDER — PREDNISONE 10 MG PO TABS
10.0000 mg | ORAL_TABLET | Freq: Two times a day (BID) | ORAL | Status: AC
  Administered 2015-08-14 (×2): 10 mg via ORAL
  Filled 2015-08-13 (×2): qty 1

## 2015-08-13 MED ORDER — BUPIVACAINE HCL 0.25 % IJ SOLN
INTRAMUSCULAR | Status: DC | PRN
  Administered 2015-08-13: 20 mL

## 2015-08-13 MED ORDER — CEFAZOLIN SODIUM-DEXTROSE 2-3 GM-% IV SOLR
2.0000 g | Freq: Four times a day (QID) | INTRAVENOUS | Status: AC
  Administered 2015-08-13 (×2): 2 g via INTRAVENOUS
  Filled 2015-08-13 (×2): qty 50

## 2015-08-13 MED ORDER — EPHEDRINE SULFATE 50 MG/ML IJ SOLN
INTRAMUSCULAR | Status: DC | PRN
  Administered 2015-08-13: 5 mg via INTRAVENOUS
  Administered 2015-08-13: 10 mg via INTRAVENOUS
  Administered 2015-08-13: 5 mg via INTRAVENOUS

## 2015-08-13 MED ORDER — DOCUSATE SODIUM 100 MG PO CAPS
100.0000 mg | ORAL_CAPSULE | Freq: Two times a day (BID) | ORAL | Status: DC
  Administered 2015-08-13 – 2015-08-16 (×6): 100 mg via ORAL

## 2015-08-13 MED ORDER — ACETAMINOPHEN 500 MG PO TABS
1000.0000 mg | ORAL_TABLET | Freq: Four times a day (QID) | ORAL | Status: AC
  Administered 2015-08-13 – 2015-08-14 (×3): 1000 mg via ORAL
  Filled 2015-08-13 (×3): qty 2

## 2015-08-13 MED ORDER — PANTOPRAZOLE SODIUM 40 MG PO TBEC
40.0000 mg | DELAYED_RELEASE_TABLET | Freq: Every evening | ORAL | Status: DC
Start: 2015-08-13 — End: 2015-08-16
  Administered 2015-08-13 – 2015-08-15 (×3): 40 mg via ORAL
  Filled 2015-08-13 (×5): qty 1

## 2015-08-13 MED ORDER — METOCLOPRAMIDE HCL 10 MG PO TABS: 5.0000 mg | ORAL_TABLET | Freq: Three times a day (TID) | ORAL | Status: DC | PRN

## 2015-08-13 MED ORDER — LORATADINE 10 MG PO TABS
10.0000 mg | ORAL_TABLET | Freq: Every day | ORAL | Status: DC
  Administered 2015-08-14 – 2015-08-16 (×3): 10 mg via ORAL
  Filled 2015-08-13 (×3): qty 1

## 2015-08-13 MED ORDER — PHENYLEPHRINE 40 MCG/ML (10ML) SYRINGE FOR IV PUSH (FOR BLOOD PRESSURE SUPPORT)
PREFILLED_SYRINGE | INTRAVENOUS | Status: AC
  Filled 2015-08-13: qty 10

## 2015-08-13 MED ORDER — BUPIVACAINE HCL (PF) 0.25 % IJ SOLN
INTRAMUSCULAR | Status: AC
  Filled 2015-08-13: qty 30

## 2015-08-13 MED ORDER — ALBUTEROL SULFATE (2.5 MG/3ML) 0.083% IN NEBU: 3.0000 mL | INHALATION_SOLUTION | RESPIRATORY_TRACT | Status: DC | PRN

## 2015-08-13 MED ORDER — PHENYLEPHRINE HCL 10 MG/ML IJ SOLN
INTRAMUSCULAR | Status: DC | PRN
  Administered 2015-08-13 (×3): 80 ug via INTRAVENOUS
  Administered 2015-08-13: 40 ug via INTRAVENOUS
  Administered 2015-08-13: 80 ug via INTRAVENOUS

## 2015-08-13 MED ORDER — FENTANYL CITRATE (PF) 100 MCG/2ML IJ SOLN
INTRAMUSCULAR | Status: DC | PRN
  Administered 2015-08-13: 25 ug via INTRAVENOUS
  Administered 2015-08-13: 50 ug via INTRAVENOUS
  Administered 2015-08-13: 25 ug via INTRAVENOUS

## 2015-08-13 MED ORDER — SODIUM CHLORIDE 0.9 % IJ SOLN
INTRAMUSCULAR | Status: AC
  Filled 2015-08-13: qty 20

## 2015-08-13 MED ORDER — PROPOFOL 10 MG/ML IV BOLUS
INTRAVENOUS | Status: AC
  Filled 2015-08-13: qty 60

## 2015-08-13 MED ORDER — GUAIFENESIN ER 600 MG PO TB12
600.0000 mg | ORAL_TABLET | Freq: Every day | ORAL | Status: DC
  Administered 2015-08-14 – 2015-08-16 (×3): 600 mg via ORAL
  Filled 2015-08-13 (×3): qty 1

## 2015-08-13 MED ORDER — TRAMADOL HCL 50 MG PO TABS: 50.0000 mg | ORAL_TABLET | Freq: Four times a day (QID) | ORAL | Status: DC | PRN

## 2015-08-13 MED ORDER — MENTHOL 3 MG MT LOZG: 1.0000 | LOZENGE | OROMUCOSAL | Status: DC | PRN

## 2015-08-13 MED ORDER — DIPHENHYDRAMINE HCL 12.5 MG/5ML PO ELIX
12.5000 mg | ORAL_SOLUTION | ORAL | Status: DC | PRN
  Administered 2015-08-14: 25 mg via ORAL
  Filled 2015-08-13: qty 10

## 2015-08-13 MED ORDER — FLEET ENEMA 7-19 GM/118ML RE ENEM: 1.0000 | ENEMA | Freq: Once | RECTAL | Status: DC | PRN

## 2015-08-13 MED ORDER — ACETAMINOPHEN 325 MG PO TABS
650.0000 mg | ORAL_TABLET | Freq: Four times a day (QID) | ORAL | Status: DC | PRN
  Administered 2015-08-14 – 2015-08-15 (×2): 650 mg via ORAL
  Filled 2015-08-13 (×2): qty 2

## 2015-08-13 MED ORDER — 0.9 % SODIUM CHLORIDE (POUR BTL) OPTIME
TOPICAL | Status: DC | PRN
  Administered 2015-08-13: 1000 mL

## 2015-08-13 MED ORDER — LACTATED RINGERS IV SOLN
INTRAVENOUS | Status: DC | PRN
  Administered 2015-08-13 (×3): via INTRAVENOUS

## 2015-08-13 MED ORDER — DEXAMETHASONE SODIUM PHOSPHATE 10 MG/ML IJ SOLN
10.0000 mg | Freq: Once | INTRAMUSCULAR | Status: AC
  Administered 2015-08-13: 10 mg via INTRAVENOUS

## 2015-08-13 MED ORDER — ONDANSETRON HCL 4 MG/2ML IJ SOLN
INTRAMUSCULAR | Status: AC
  Filled 2015-08-13: qty 2

## 2015-08-13 MED ORDER — MORPHINE SULFATE (PF) 2 MG/ML IV SOLN
1.0000 mg | INTRAVENOUS | Status: DC | PRN
  Administered 2015-08-14: 1 mg via INTRAVENOUS
  Filled 2015-08-13: qty 1

## 2015-08-13 MED ORDER — ONDANSETRON HCL 4 MG PO TABS: 4.0000 mg | ORAL_TABLET | Freq: Four times a day (QID) | ORAL | Status: DC | PRN

## 2015-08-13 MED ORDER — PROPOFOL 10 MG/ML IV BOLUS
INTRAVENOUS | Status: DC | PRN
  Administered 2015-08-13 (×2): 20 mg via INTRAVENOUS

## 2015-08-13 MED ORDER — DEXTROSE-NACL 5-0.9 % IV SOLN
INTRAVENOUS | Status: DC
Start: 2015-08-13 — End: 2015-08-16
  Administered 2015-08-13: 20:00:00 via INTRAVENOUS
  Administered 2015-08-13: 100 mL/h via INTRAVENOUS

## 2015-08-13 MED ORDER — BUPIVACAINE LIPOSOME 1.3 % IJ SUSP
20.0000 mL | Freq: Once | INTRAMUSCULAR | Status: DC
  Filled 2015-08-13: qty 20

## 2015-08-13 MED ORDER — FENTANYL CITRATE (PF) 100 MCG/2ML IJ SOLN
INTRAMUSCULAR | Status: AC
  Filled 2015-08-13: qty 2

## 2015-08-13 MED ORDER — OXYCODONE HCL 5 MG PO TABS
5.0000 mg | ORAL_TABLET | ORAL | Status: DC | PRN
  Administered 2015-08-13 (×4): 10 mg via ORAL
  Administered 2015-08-13 (×2): 5 mg via ORAL
  Administered 2015-08-14 – 2015-08-16 (×16): 10 mg via ORAL
  Filled 2015-08-13 (×7): qty 2
  Filled 2015-08-13 (×2): qty 1
  Filled 2015-08-13 (×13): qty 2

## 2015-08-13 MED ORDER — CEFAZOLIN SODIUM-DEXTROSE 2-3 GM-% IV SOLR
INTRAVENOUS | Status: AC
  Filled 2015-08-13: qty 50

## 2015-08-13 MED ORDER — SODIUM CHLORIDE 0.9 % IJ SOLN
INTRAMUSCULAR | Status: DC | PRN
  Administered 2015-08-13: 30 mL

## 2015-08-13 MED ORDER — DEXTROSE 5 % IV SOLN
500.0000 mg | Freq: Four times a day (QID) | INTRAVENOUS | Status: DC | PRN
  Filled 2015-08-13: qty 5

## 2015-08-13 MED ORDER — ACETAMINOPHEN 650 MG RE SUPP: 650.0000 mg | Freq: Four times a day (QID) | RECTAL | Status: DC | PRN

## 2015-08-13 MED ORDER — PREDNISONE 20 MG PO TABS
20.0000 mg | ORAL_TABLET | Freq: Once | ORAL | Status: AC
  Administered 2015-08-13: 20 mg via ORAL
  Filled 2015-08-13: qty 1

## 2015-08-13 MED ORDER — DEXAMETHASONE SODIUM PHOSPHATE 10 MG/ML IJ SOLN
10.0000 mg | Freq: Once | INTRAMUSCULAR | Status: AC
  Administered 2015-08-14: 10 mg via INTRAVENOUS
  Filled 2015-08-13: qty 1

## 2015-08-13 MED ORDER — ONDANSETRON HCL 4 MG/2ML IJ SOLN: 4.0000 mg | Freq: Four times a day (QID) | INTRAMUSCULAR | Status: DC | PRN

## 2015-08-13 MED ORDER — MONTELUKAST SODIUM 10 MG PO TABS
10.0000 mg | ORAL_TABLET | Freq: Every evening | ORAL | Status: DC
  Administered 2015-08-13 – 2015-08-15 (×3): 10 mg via ORAL
  Filled 2015-08-13 (×4): qty 1

## 2015-08-13 MED ORDER — METOCLOPRAMIDE HCL 5 MG/ML IJ SOLN: 5.0000 mg | Freq: Three times a day (TID) | INTRAMUSCULAR | Status: DC | PRN

## 2015-08-13 MED ORDER — ACETAMINOPHEN 10 MG/ML IV SOLN
INTRAVENOUS | Status: AC
  Filled 2015-08-13: qty 100

## 2015-08-13 MED ORDER — ONDANSETRON HCL 4 MG/2ML IJ SOLN: 4.0000 mg | Freq: Once | INTRAMUSCULAR | Status: DC | PRN

## 2015-08-13 MED ORDER — SIMVASTATIN 20 MG PO TABS
20.0000 mg | ORAL_TABLET | Freq: Every day | ORAL | Status: DC
Start: 2015-08-13 — End: 2015-08-16
  Administered 2015-08-13 – 2015-08-15 (×3): 20 mg via ORAL
  Filled 2015-08-13 (×4): qty 1

## 2015-08-13 MED ORDER — FENTANYL CITRATE (PF) 100 MCG/2ML IJ SOLN: 25.0000 ug | INTRAMUSCULAR | Status: DC | PRN

## 2015-08-13 MED ORDER — MIDAZOLAM HCL 2 MG/2ML IJ SOLN
INTRAMUSCULAR | Status: AC
  Filled 2015-08-13: qty 2

## 2015-08-13 SURGICAL SUPPLY — 54 items
BAG DECANTER FOR FLEXI CONT (MISCELLANEOUS) ×1 IMPLANT
BAG SPEC THK2 15X12 ZIP CLS (MISCELLANEOUS) ×1
BAG ZIPLOCK 12X15 (MISCELLANEOUS) ×3 IMPLANT
BANDAGE ACE 6X5 VEL STRL LF (GAUZE/BANDAGES/DRESSINGS) ×3 IMPLANT
BLADE SAG 18X100X1.27 (BLADE) ×3 IMPLANT
BLADE SAW SGTL 11.0X1.19X90.0M (BLADE) ×3 IMPLANT
BOWL SMART MIX CTS (DISPOSABLE) ×3 IMPLANT
CAP KNEE TOTAL 3 SIGMA ×2 IMPLANT
CEMENT HV SMART SET (Cement) ×4 IMPLANT
CLOSURE WOUND 1/2 X4 (GAUZE/BANDAGES/DRESSINGS) ×2
CLOTH BEACON ORANGE TIMEOUT ST (SAFETY) ×3 IMPLANT
CUFF TOURN SGL QUICK 34 (TOURNIQUET CUFF) ×3
CUFF TRNQT CYL 34X4X40X1 (TOURNIQUET CUFF) ×1 IMPLANT
DECANTER SPIKE VIAL GLASS SM (MISCELLANEOUS) ×3 IMPLANT
DRAPE U-SHAPE 47X51 STRL (DRAPES) ×5 IMPLANT
DRSG ADAPTIC 3X8 NADH LF (GAUZE/BANDAGES/DRESSINGS) ×3 IMPLANT
DURAPREP 26ML APPLICATOR (WOUND CARE) ×3 IMPLANT
ELECT REM PT RETURN 9FT ADLT (ELECTROSURGICAL) ×3
ELECTRODE REM PT RTRN 9FT ADLT (ELECTROSURGICAL) ×1 IMPLANT
EVACUATOR 1/8 PVC DRAIN (DRAIN) ×3 IMPLANT
GAUZE SPONGE 4X4 12PLY STRL (GAUZE/BANDAGES/DRESSINGS) ×3 IMPLANT
GLOVE BIO SURGEON STRL SZ7.5 (GLOVE) IMPLANT
GLOVE BIO SURGEON STRL SZ8 (GLOVE) ×3 IMPLANT
GLOVE BIOGEL PI IND STRL 6.5 (GLOVE) IMPLANT
GLOVE BIOGEL PI IND STRL 8 (GLOVE) ×1 IMPLANT
GLOVE BIOGEL PI INDICATOR 6.5 (GLOVE)
GLOVE BIOGEL PI INDICATOR 8 (GLOVE) ×2
GLOVE SURG SS PI 6.5 STRL IVOR (GLOVE) IMPLANT
GOWN STRL REUS W/TWL LRG LVL3 (GOWN DISPOSABLE) ×3 IMPLANT
GOWN STRL REUS W/TWL XL LVL3 (GOWN DISPOSABLE) IMPLANT
HANDPIECE INTERPULSE COAX TIP (DISPOSABLE) ×3
IMMOBILIZER KNEE 20 (SOFTGOODS) ×3
IMMOBILIZER KNEE 20 THIGH 36 (SOFTGOODS) ×1 IMPLANT
MANIFOLD NEPTUNE II (INSTRUMENTS) ×3 IMPLANT
NS IRRIG 1000ML POUR BTL (IV SOLUTION) ×3 IMPLANT
PACK TOTAL KNEE CUSTOM (KITS) ×3 IMPLANT
PAD ABD 8X10 STRL (GAUZE/BANDAGES/DRESSINGS) ×2 IMPLANT
PADDING CAST ABS 6INX4YD NS (CAST SUPPLIES) ×4
PADDING CAST ABS COTTON 6X4 NS (CAST SUPPLIES) IMPLANT
PADDING CAST COTTON 6X4 STRL (CAST SUPPLIES) ×9 IMPLANT
POSITIONER SURGICAL ARM (MISCELLANEOUS) ×3 IMPLANT
SET HNDPC FAN SPRY TIP SCT (DISPOSABLE) ×1 IMPLANT
STRIP CLOSURE SKIN 1/2X4 (GAUZE/BANDAGES/DRESSINGS) ×4 IMPLANT
SUCTION FRAZIER 12FR DISP (SUCTIONS) ×2 IMPLANT
SUT MNCRL AB 4-0 PS2 18 (SUTURE) ×3 IMPLANT
SUT VIC AB 2-0 CT1 27 (SUTURE) ×9
SUT VIC AB 2-0 CT1 TAPERPNT 27 (SUTURE) ×3 IMPLANT
SUT VLOC 180 0 24IN GS25 (SUTURE) ×3 IMPLANT
SYR 50ML LL SCALE MARK (SYRINGE) ×3 IMPLANT
TRAY FOLEY W/METER SILVER 14FR (SET/KITS/TRAYS/PACK) ×1 IMPLANT
TRAY FOLEY W/METER SILVER 16FR (SET/KITS/TRAYS/PACK) ×3 IMPLANT
WATER STERILE IRR 1500ML POUR (IV SOLUTION) ×3 IMPLANT
WRAP KNEE MAXI GEL POST OP (GAUZE/BANDAGES/DRESSINGS) ×3 IMPLANT
YANKAUER SUCT BULB TIP 10FT TU (MISCELLANEOUS) ×3 IMPLANT

## 2015-08-13 NOTE — Discharge Instructions (Addendum)
Dr. Gaynelle Arabian Total Joint Specialist [facility] [address], [place], [place] [address] [phone]  TOTAL KNEE REPLACEMENT POSTOPERATIVE DIRECTIONS  Knee Rehabilitation, Guidelines Following Surgery  Results after knee surgery are often greatly improved when you follow the exercise, range of motion and muscle strengthening exercises prescribed by your doctor. Safety measures are also important to protect the knee from further injury. Any time any of these exercises cause you to have increased pain or swelling in your knee joint, decrease the amount until you are comfortable again and slowly increase them. If you have problems or questions, call your caregiver or physical therapist for advice.   HOME CARE INSTRUCTIONS  Remove items at home which could result in a fall. This includes throw rugs or furniture in walking pathways.   ICE to the affected knee every three hours for 30 minutes at a time and then as needed for pain and swelling.  Continue to use ice on the knee for pain and swelling from surgery. You may notice swelling that will progress down to the foot and ankle.  This is normal after surgery.  Elevate the leg when you are not up walking on it.    Continue to use the breathing machine which will help keep your temperature down.  It is common for your temperature to cycle up and down following surgery, especially at night when you are not up moving around and exerting yourself.  The breathing machine keeps your lungs expanded and your temperature down.  Do not place pillow under knee, focus on keeping the knee straight while resting  DIET You may resume your previous home diet once your are discharged from the hospital.  DRESSING / WOUND CARE / SHOWERING You may start showering once you are discharged home but do not submerge the incision under water. Just pat the incision dry and apply a dry gauze dressing on daily. Change the surgical dressing  daily and reapply a dry dressing each time.  ACTIVITY Walk with your walker as instructed. Use walker as long as suggested by your caregivers. Avoid periods of inactivity such as sitting longer than an hour when not asleep. This helps prevent blood clots.  You may resume a sexual relationship in one month or when given the OK by your doctor.  You may return to work once you are cleared by your doctor.  Do not drive a car for 6 weeks or until released by you surgeon.  Do not drive while taking narcotics.  WEIGHT BEARING AS TOLERATED   POSTOPERATIVE CONSTIPATION PROTOCOL Constipation - defined medically as fewer than three stools per week and severe constipation as less than one stool per week.  One of the most common issues patients have following surgery is constipation.  Even if you have a regular bowel pattern at home, your normal regimen is likely to be disrupted due to multiple reasons following surgery.  Combination of anesthesia, postoperative narcotics, change in appetite and fluid intake all can affect your bowels.  In order to avoid complications following surgery, here are some recommendations in order to help you during your recovery period.  Colace (docusate) - Pick up an over-the-counter form of Colace or another stool softener and take twice a day as long as you are requiring postoperative pain medications.  Take with a full glass of water daily.  If you experience loose stools or diarrhea, hold the colace until you stool forms back up.  If your symptoms do not get  better within 1 week or if they get worse, check with your doctor.  Dulcolax (bisacodyl) - Pick up over-the-counter and take as directed by the product packaging as needed to assist with the movement of your bowels.  Take with a full glass of water.  Use this product as needed if not relieved by Colace only.   MiraLax (polyethylene glycol) - Pick up over-the-counter to have on hand.  MiraLax is a solution that will  increase the amount of water in your bowels to assist with bowel movements.  Take as directed and can mix with a glass of water, juice, soda, coffee, or tea.  Take if you go more than two days without a movement. Do not use MiraLax more than once per day. Call your doctor if you are still constipated or irregular after using this medication for 7 days in a row.  If you continue to have problems with postoperative constipation, please contact the office for further assistance and recommendations.  If you experience "the worst abdominal pain ever" or develop nausea or vomiting, please contact the office immediatly for further recommendations for treatment.  ITCHING  If you experience itching with your medications, try taking only a single pain pill, or even half a pain pill at a time.  You can also use Benadryl over the counter for itching or also to help with sleep.   TED HOSE STOCKINGS Wear the elastic stockings on both legs for three weeks following surgery during the day but you may remove then at night for sleeping.  MEDICATIONS See your medication summary on the After Visit Summary that the nursing staff will review with you prior to discharge.  You may have some home medications which will be placed on hold until you complete the course of blood thinner medication.  It is important for you to complete the blood thinner medication as prescribed by your surgeon.  Continue your approved medications as instructed at time of discharge.  PRECAUTIONS If you experience chest pain or shortness of breath - call 911 immediately for transfer to the hospital emergency department.  If you develop a fever greater that 101 F, purulent drainage from wound, increased redness or drainage from wound, foul odor from the wound/dressing, or calf pain - CONTACT YOUR SURGEON.                                                   FOLLOW-UP APPOINTMENTS Make sure you keep all of your appointments after your operation  with your surgeon and caregivers. You should call the office at the above phone number and make an appointment for approximately two weeks after the date of your surgery or on the date instructed by your surgeon outlined in the "After Visit Summary".   RANGE OF MOTION AND STRENGTHENING EXERCISES  Rehabilitation of the knee is important following a knee injury or an operation. After just a few days of immobilization, the muscles of the thigh which control the knee become weakened and shrink (atrophy). Knee exercises are designed to build up the tone and strength of the thigh muscles and to improve knee motion. Often times heat used for twenty to thirty minutes before working out will loosen up your tissues and help with improving the range of motion but do not use heat for the first two weeks following surgery. These exercises  can be done on a training (exercise) mat, on the floor, on a table or on a bed. Use what ever works the best and is most comfortable for you Knee exercises include:  Leg Lifts - While your knee is still immobilized in a splint or cast, you can do straight leg raises. Lift the leg to 60 degrees, hold for 3 sec, and slowly lower the leg. Repeat 10-20 times 2-3 times daily. Perform this exercise against resistance later as your knee gets better.  Quad and Hamstring Sets - Tighten up the muscle on the front of the thigh (Quad) and hold for 5-10 sec. Repeat this 10-20 times hourly. Hamstring sets are done by pushing the foot backward against an object and holding for 5-10 sec. Repeat as with quad sets.   Leg Slides: Lying on your back, slowly slide your foot toward your buttocks, bending your knee up off the floor (only go as far as is comfortable). Then slowly slide your foot back down until your leg is flat on the floor again.  Angel Wings: Lying on your back spread your legs to the side as far apart as you can without causing discomfort.  A rehabilitation program following serious knee  injuries can speed recovery and prevent re-injury in the future due to weakened muscles. Contact your doctor or a physical therapist for more information on knee rehabilitation.   IF YOU ARE TRANSFERRED TO A SKILLED REHAB FACILITY If the patient is transferred to a skilled rehab facility following release from the hospital, a list of the current medications will be sent to the facility for the patient to continue.  When discharged from the skilled rehab facility, please have the facility set up the patient's [place] prior to being released. Also, the skilled facility will be responsible for providing the patient with their medications at time of release from the facility to include their pain medication, the muscle relaxants, and their blood thinner medication. If the patient is still at the rehab facility at time of the two week follow up appointment, the skilled rehab facility will also need to assist the patient in arranging follow up appointment in our office and any transportation needs.  MAKE SURE YOU:  Understand these instructions.  Get help right away if you are not doing well or get worse.    Pick up stool softner and laxative for home use following surgery while on pain medications. Do not submerge incision under water. Please use good hand washing techniques while changing dressing each day. May shower starting three days after surgery. Please use a clean towel to pat the incision dry following showers. Continue to use ice for pain and swelling after surgery. Do not use any lotions or creams on the incision until instructed by your surgeon.   Take Xarelto for two and a half more weeks, then discontinue Xarelto. Once the patient has completed the Xarelto, they may resume the 81 mg Aspirin.    Information on my medicine - XARELTO (Rivaroxaban)  This medication education was reviewed with me or my healthcare representative as part of my discharge preparation.   The pharmacist that spoke with me during my hospital stay was:  Absher, Julieta Bellini, RPH  Why was Xarelto prescribed for you? Xarelto was prescribed for you to reduce the risk of blood clots forming after orthopedic surgery. The medical term for these abnormal blood clots is venous thromboembolism (VTE).  What do you need to know about xarelto ? Take  your Xarelto ONCE DAILY at the same time every day. You may take it either with or without food.  If you have difficulty swallowing the tablet whole, you may crush it and mix in applesauce just prior to taking your dose.  Take Xarelto exactly as prescribed by your doctor and DO NOT stop taking Xarelto without talking to the doctor who prescribed the medication.  Stopping without other VTE prevention medication to take the place of Xarelto may increase your risk of developing a clot.  After discharge, you should have regular check-up appointments with your healthcare provider that is prescribing your Xarelto.    What do you do if you miss a dose? If you miss a dose, take it as soon as you remember on the same day then continue your regularly scheduled once daily regimen the next day. Do not take two doses of Xarelto on the same day.   Important Safety Information A possible side effect of Xarelto is bleeding. You should call your healthcare provider right away if you experience any of the following: ? Bleeding from an injury or your nose that does not stop. ? Unusual colored urine (red or dark brown) or unusual colored stools (red or black). ? Unusual bruising for unknown reasons. ? A serious fall or if you hit your head (even if there is no bleeding).  Some medicines may interact with Xarelto and might increase your risk of bleeding while on Xarelto. To help avoid this, consult your healthcare provider or pharmacist prior to using any new prescription or non-prescription medications, including herbals, vitamins, non-steroidal  anti-inflammatory drugs (NSAIDs) and supplements.  This website has more information on Xarelto: [email or web address]

## 2015-08-13 NOTE — Anesthesia Preprocedure Evaluation (Addendum)
Anesthesia Evaluation  Patient identified by MRN, date of birth, ID band Patient awake    Reviewed: Allergy & Precautions, NPO status , Patient's Chart, lab work & pertinent test results  History of Anesthesia Complications Negative for: history of anesthetic complications  Airway Mallampati: II  TM Distance: >3 FB Neck ROM: Full    Dental no notable dental hx. (+) Dental Advisory Given,    Pulmonary asthma , COPD, former smoker,    Pulmonary exam normal breath sounds clear to auscultation       Cardiovascular + Peripheral Vascular Disease  Normal cardiovascular exam Rhythm:Regular Rate:Normal     Neuro/Psych negative neurological ROS  negative psych ROS   GI/Hepatic Neg liver ROS, PUD,   Endo/Other  negative endocrine ROS  Renal/GU negative Renal ROS  negative genitourinary   Musculoskeletal negative musculoskeletal ROS (+)   Abdominal   Peds negative pediatric ROS (+)  Hematology negative hematology ROS (+)   Anesthesia Other Findings   Reproductive/Obstetrics negative OB ROS                           Anesthesia Physical Anesthesia Plan  ASA: II  Anesthesia Plan: Spinal   Post-op Pain Management:    Induction:   Airway Management Planned:   Additional Equipment:   Intra-op Plan:   Post-operative Plan:   Informed Consent: I have reviewed the patients History and Physical, chart, labs and discussed the procedure including the risks, benefits and alternatives for the proposed anesthesia with the patient or authorized representative who has indicated his/her understanding and acceptance.   Dental advisory given  Plan Discussed with: CRNA  Anesthesia Plan Comments:         Anesthesia Quick Evaluation

## 2015-08-13 NOTE — Evaluation (Signed)
Physical Therapy Evaluation Patient Details Name: [name] MRN: [id number] DOB: 02-01-1941 Today's Date: 08/13/2015   History of Present Illness  R TKR.  Pt wtih hx of Bil RCR and COPD  Clinical Impression  Pt s/p R TKR presents with decreased R LE strength/ROM and post op pain limiting functional mobility.  Pt should progress to dc home with family assist and HHPT follow up.    Follow Up Recommendations Home health PT    Equipment Recommendations  Rolling walker with 5" wheels    Recommendations for Other Services OT consult     Precautions / Restrictions Precautions Precautions: Fall Restrictions Weight Bearing Restrictions: No Other Position/Activity Restrictions: WBAT      Mobility  Bed Mobility Overal bed mobility: Needs Assistance Bed Mobility: Supine to Sit     Supine to sit: Min assist     General bed mobility comments: cues for sequence and use of L LE to self assist  Transfers Overall transfer level: Needs assistance Equipment used: Rolling walker (2 wheeled) Transfers: Sit to/from Stand Sit to Stand: Min assist;Mod assist         General transfer comment: cues for LE management and use of UEs to self assist  Ambulation/Gait Ambulation/Gait assistance: Min assist Ambulation Distance (Feet): 38 Feet Assistive device: Rolling walker (2 wheeled) Gait Pattern/deviations: Step-to pattern;Decreased step length - right;Decreased step length - left;Shuffle;Trunk flexed Gait velocity: decr Gait velocity interpretation: Below normal speed for age/gender General Gait Details: cues for posture, sequence and position from [employer]            Wheelchair Mobility    Modified Rankin (Stroke Patients Only)       Balance                                             Pertinent Vitals/Pain Pain Assessment: 0-10 Pain Score: 3  Pain Location: R knee Pain Descriptors / Indicators: Aching;Sore Pain Intervention(s): Limited  activity within patient's tolerance;Monitored during session;Premedicated before session;Ice applied    Home Living Family/patient expects to be discharged to:: Private residence Living Arrangements: Spouse/significant other Available Help at Discharge: Family Type of Home: House Home Access: Stairs to enter Entrance Stairs-Rails: Psychiatric nurse of Steps: 4 Home Layout: One level        Prior Function Level of Independence: Independent               Hand Dominance        Extremity/Trunk Assessment   Upper Extremity Assessment: Overall WFL for tasks assessed           Lower Extremity Assessment: RLE deficits/detail RLE Deficits / Details: 3-/5 quads    Cervical / Trunk Assessment: Normal  Communication   Communication: No difficulties  Cognition Arousal/Alertness: Awake/alert Behavior During Therapy: WFL for tasks assessed/performed Overall Cognitive Status: Within Functional Limits for tasks assessed                      General Comments      Exercises Total Joint Exercises Ankle Circles/Pumps: AROM;Both;15 reps;Supine      Assessment/Plan    PT Assessment Patient needs continued PT services  PT Diagnosis Difficulty walking   PT Problem List Decreased strength;Decreased range of motion;Decreased activity tolerance;Decreased mobility;Decreased knowledge of use of DME;Pain  PT Treatment Interventions DME instruction;Gait training;Stair training;Functional mobility training;Therapeutic activities;Therapeutic exercise;Patient/family  education   PT Goals (Current goals can be found in the Care Plan section) Acute Rehab PT Goals Patient Stated Goal: Resume previous lifestyle with decreased pain PT Goal Formulation: With patient Time For Goal Achievement: 08/18/15 Potential to Achieve Goals: Good    Frequency 7X/week   Barriers to discharge        Co-evaluation               End of Session Equipment Utilized  During Treatment: Gait belt;Right knee immobilizer Activity Tolerance: Patient tolerated treatment well Patient left: in chair;with call bell/phone within reach Nurse Communication: Mobility status         Time: 5301-0404 PT Time Calculation (min) (ACUTE ONLY): 30 min   Charges:   PT Evaluation $PT Eval Low Complexity: 1 Procedure PT Treatments $Gait Training: 8-22 mins   PT G Codes:        Starlyn Droge Aug 15, 2015, 4:41 PM

## 2015-08-13 NOTE — Anesthesia Postprocedure Evaluation (Signed)
Anesthesia Post Note  Patient: [name]  Procedure(s) Performed: Procedure(s) (LRB): RIGHT TOTAL KNEE ARTHROPLASTY (Right)  Patient location during evaluation: PACU Anesthesia Type: Spinal Level of consciousness: awake and alert Pain management: pain level controlled Vital Signs Assessment: post-procedure vital signs reviewed and stable Respiratory status: spontaneous breathing, nonlabored ventilation, respiratory function stable and patient connected to nasal cannula oxygen Cardiovascular status: blood pressure returned to baseline and stable Postop Assessment: no signs of nausea or vomiting Anesthetic complications: no    Last Vitals:  Filed Vitals:   08/13/15 0920 08/13/15 0932  BP: 124/72 116/68  Pulse: 76   Temp:  36.5 C  Resp: 16 16    Last Pain:  Filed Vitals:   08/13/15 0951  PainSc: 2                  [name]

## 2015-08-13 NOTE — Progress Notes (Signed)
Utilization review completed.  

## 2015-08-13 NOTE — Op Note (Signed)
Pre-operative diagnosis- Osteoarthritis  Right knee(s)  Post-operative diagnosis- Osteoarthritis Right knee(s)  Procedure-  Right  Total Knee Arthroplasty  Surgeon- [name] Plover. Climmie Buelow, MD  Assistant- Arlee Muslim, PA-C   Anesthesia-  Spinal  EBL-* No blood loss amount entered *   Drains Hemovac  Tourniquet time-  Total Tourniquet Time Documented: Thigh (Right) - 31 minutes Total: Thigh (Right) - 31 minutes     Complications- None  Condition-PACU - hemodynamically stable.   Brief Clinical Note  [name] is a 75 y.o. year old male with end stage OA of his right knee with progressively worsening pain and dysfunction. He has constant pain, with activity and at rest and significant functional deficits with difficulties even with ADLs. He has had extensive non-op management including analgesics, injections of cortisone and viscosupplements, and home exercise program, but remains in significant pain with significant dysfunction. Radiographs show bone on bone arthritis medial and patellofemoral. He presents now for right Total Knee Arthroplasty.    Procedure in detail---   The patient is brought into the operating room and positioned supine on the operating table. After successful administration of  Spinal,   a tourniquet is placed high on the  Right thigh(s) and the lower extremity is prepped and draped in the usual sterile fashion. Time out is performed by the operating team and then the  Right lower extremity is wrapped in Esmarch, knee flexed and the tourniquet inflated to 300 mmHg.       A midline incision is made with a ten blade through the subcutaneous tissue to the level of the extensor mechanism. A fresh blade is used to make a medial parapatellar arthrotomy. Soft tissue over the proximal medial tibia is subperiosteally elevated to the joint line with a knife and into the semimembranosus bursa with a Cobb elevator. Soft tissue over the proximal lateral tibia is elevated with  attention being paid to avoiding the patellar tendon on the tibial tubercle. The patella is everted, knee flexed 90 degrees and the ACL and PCL are removed. Findings are bone on bone medial and patellofemoral with large global osteophytes.        The drill is used to create a starting hole in the distal femur and the canal is thoroughly irrigated with sterile saline to remove the fatty contents. The 5 degree Right  valgus alignment guide is placed into the femoral canal and the distal femoral cutting block is pinned to remove 10 mm off the distal femur. Resection is made with an oscillating saw.      The tibia is subluxed forward and the menisci are removed. The extramedullary alignment guide is placed referencing proximally at the medial aspect of the tibial tubercle and distally along the second metatarsal axis and tibial crest. The block is pinned to remove 19m off the more deficient medial  side. Resection is made with an oscillating saw. Size 5is the most appropriate size for the tibia and the proximal tibia is prepared with the modular drill and keel punch for that size.      The femoral sizing guide is placed and size 4 is most appropriate. Rotation is marked off the epicondylar axis and confirmed by creating a rectangular flexion gap at 90 degrees. The size 4 cutting block is pinned in this rotation and the anterior, posterior and chamfer cuts are made with the oscillating saw. The intercondylar block is then placed and that cut is made.      Trial size 5 tibial component,  trial size 4 posterior stabilized femur and a 12.5  mm posterior stabilized rotating platform insert trial is placed. Full extension is achieved with excellent varus/valgus and anterior/posterior balance throughout full range of motion. The patella is everted and thickness measured to be 25  mm. Free hand resection is taken to 14 mm, a 41 template is placed, lug holes are drilled, trial patella is placed, and it tracks normally.  Osteophytes are removed off the posterior femur with the trial in place. All trials are removed and the cut bone surfaces prepared with pulsatile lavage. Cement is mixed and once ready for implantation, the size 5 tibial implant, size  4 posterior stabilized femoral component, and the size 41 patella are cemented in place and the patella is held with the clamp. The trial insert is placed and the knee held in full extension. The Exparel (20 ml mixed with 30 ml saline) and .25% Bupivicaine, are injected into the extensor mechanism, posterior capsule, medial and lateral gutters and subcutaneous tissues.  All extruded cement is removed and once the cement is hard the permanent 12.5 mm posterior stabilized rotating platform insert is placed into the tibial tray.      The wound is copiously irrigated with saline solution and the extensor mechanism closed over a hemovac drain with #1 V-loc suture. The tourniquet is released for a total tourniquet time of 30  minutes. Flexion against gravity is 140 degrees and the patella tracks normally. Subcutaneous tissue is closed with 2.0 vicryl and subcuticular with running 4.0 Monocryl. The incision is cleaned and dried and steri-strips and a bulky sterile dressing are applied. The limb is placed into a knee immobilizer and the patient is awakened and transported to recovery in stable condition.      Please note that a surgical assistant was a medical necessity for this procedure in order to perform it in a safe and expeditious manner. Surgical assistant was necessary to retract the ligaments and vital neurovascular structures to prevent injury to them and also necessary for proper positioning of the limb to allow for anatomic placement of the prosthesis.   [name] Plover Guerin Lashomb, MD    08/13/2015, 8:23 AM

## 2015-08-13 NOTE — Interval H&P Note (Signed)
History and Physical Interval Note:  08/13/2015 7:11 AM  [name]  has presented today for surgery, with the diagnosis of right knee osteoarthritis  The various methods of treatment have been discussed with the patient and family. After consideration of risks, benefits and other options for treatment, the patient has consented to  Procedure(s): RIGHT TOTAL KNEE ARTHROPLASTY (Right) as a surgical intervention .  The patient's history has been reviewed, patient examined, no change in status, stable for surgery.  I have reviewed the patient's chart and labs.  Questions were answered to the patient's satisfaction.     Gearlean Alf

## 2015-08-13 NOTE — Anesthesia Procedure Notes (Signed)
Spinal Patient location during procedure: OR Start time: 08/13/2015 7:16 AM End time: 08/13/2015 7:19 AM Staffing Anesthesiologist: Lauretta Grill Resident/CRNA: Darlys Gales R Performed by: resident/CRNA  Preanesthetic Checklist Completed: patient identified, site marked, surgical consent, pre-op evaluation, timeout performed, IV checked, risks and benefits discussed and monitors and equipment checked Spinal Block Patient position: sitting Prep: Betadine Patient monitoring: heart rate, continuous pulse ox and blood pressure Approach: midline Location: L3-4 Injection technique: single-shot Needle Needle type: Spinocan  Needle gauge: 22 G Needle length: 9 cm Needle insertion depth: 7 cm Assessment Sensory level: T6 Additional Notes Expiration date of kit checked and confirmed. Patient tolerated procedure well, without complications.Lot [id number] exp 2016-12-04

## 2015-08-13 NOTE — Transfer of Care (Signed)
Immediate Anesthesia Transfer of Care Note  Patient: [name]  Procedure(s) Performed: Procedure(s): RIGHT TOTAL KNEE ARTHROPLASTY (Right)  Patient Location: PACU  Anesthesia Type:Spinal  Level of Consciousness:  sedated, patient cooperative and responds to stimulation  Airway & Oxygen Therapy:Patient Spontanous Breathing and Patient connected to face mask oxgen  Post-op Assessment:  Report given to PACU RN and Post -op Vital signs reviewed and stable  Post vital signs:  Reviewed and stable,spinal L2  Last Vitals:  Filed Vitals:   08/13/15 0516  BP: 121/82  Pulse: 78  Temp: 36.3 C  Resp: 18    Complications: No apparent anesthesia complications

## 2015-08-14 LAB — BASIC METABOLIC PANEL
Anion gap: 5 (ref 5–15)
BUN: 13 mg/dL (ref 6–20)
CALCIUM: 8.8 mg/dL — AB (ref 8.9–10.3)
CO2: 25 mmol/L (ref 22–32)
Chloride: 111 mmol/L (ref 101–111)
Creatinine, Ser: 0.93 mg/dL (ref 0.61–1.24)
GFR calc Af Amer: >60 mL/min (ref >60)
GLUCOSE: 126 mg/dL — AB (ref 65–99)
POTASSIUM: 4.5 mmol/L (ref 3.5–5.1)
SODIUM: 141 mmol/L (ref 135–145)

## 2015-08-14 LAB — CBC
HEMATOCRIT: 32.6 % — AB (ref 39.0–52.0)
HEMOGLOBIN: 10.6 g/dL — AB (ref 13.0–17.0)
MCH: 27.7 pg (ref 26.0–34.0)
MCHC: 32.5 g/dL (ref 30.0–36.0)
MCV: 85.3 fL (ref 78.0–100.0)
PLATELETS: 321 10*3/uL (ref 150–400)
RBC: 3.82 MIL/uL — AB (ref 4.22–5.81)
RDW: 16.3 % — AB (ref 11.5–15.5)
WBC: 20.3 10*3/uL — AB (ref 4.0–10.5)

## 2015-08-14 MED ORDER — TRAMADOL HCL 50 MG PO TABS: 50.0000 mg | ORAL_TABLET | Freq: Four times a day (QID) | ORAL | Status: DC | PRN

## 2015-08-14 MED ORDER — OXYCODONE HCL 5 MG PO TABS: 5.0000 mg | ORAL_TABLET | ORAL | Status: DC | PRN

## 2015-08-14 MED ORDER — ALBUTEROL SULFATE HFA 108 (90 BASE) MCG/ACT IN AERS: 2.0000 | INHALATION_SPRAY | RESPIRATORY_TRACT | Status: DC | PRN

## 2015-08-14 MED ORDER — RIVAROXABAN 10 MG PO TABS: 10.0000 mg | ORAL_TABLET | Freq: Every day | ORAL | Status: DC

## 2015-08-14 MED ORDER — TAMSULOSIN HCL 0.4 MG PO CAPS: 0.8000 mg | ORAL_CAPSULE | Freq: Every day | ORAL | Status: DC

## 2015-08-14 MED ORDER — MONTELUKAST SODIUM 10 MG PO TABS: 10.0000 mg | ORAL_TABLET | Freq: Every evening | ORAL | Status: DC

## 2015-08-14 MED ORDER — METHOCARBAMOL 500 MG PO TABS: 500.0000 mg | ORAL_TABLET | Freq: Four times a day (QID) | ORAL | Status: DC | PRN

## 2015-08-14 MED ORDER — LIP MEDEX EX OINT
TOPICAL_OINTMENT | CUTANEOUS | Status: AC
  Filled 2015-08-14: qty 7

## 2015-08-14 NOTE — Progress Notes (Signed)
Physical Therapy Treatment Patient Details Name: [name] MRN: [id number] DOB: 02-Nov-1940 Today's Date: 08/22/15    History of Present Illness R TKR.  Pt wtih hx of Bil RCR and COPD    PT Comments    Pt progressing well with mobility  Follow Up Recommendations  Home health PT     Equipment Recommendations  Rolling walker with 5" wheels    Recommendations for Other Services OT consult     Precautions / Restrictions Precautions Precautions: Fall Restrictions Weight Bearing Restrictions: No Other Position/Activity Restrictions: WBAT    Mobility  Bed Mobility Overal bed mobility: Needs Assistance Bed Mobility: Supine to Sit;Sit to Supine     Supine to sit: Min assist Sit to supine: Min assist   General bed mobility comments: cues for sequence and use of L LE to self assist  Transfers Overall transfer level: Needs assistance Equipment used: Rolling walker (2 wheeled) Transfers: Sit to/from Stand Sit to Stand: Min guard         General transfer comment: cues for UE/LE placement  Ambulation/Gait Ambulation/Gait assistance: Min assist;Min guard Ambulation Distance (Feet): 111 Feet Assistive device: Rolling walker (2 wheeled) Gait Pattern/deviations: Step-to pattern;Decreased step length - right;Decreased step length - left;Shuffle;Trunk flexed Gait velocity: decr   General Gait Details: cues for posture, sequence and position from RW; distance ltd by onset mild dizziness   Stairs            Wheelchair Mobility    Modified Rankin (Stroke Patients Only)       Balance                                    Cognition Arousal/Alertness: Awake/alert Behavior During Therapy: WFL for tasks assessed/performed Overall Cognitive Status: Within Functional Limits for tasks assessed                      Exercises Total Joint Exercises Ankle Circles/Pumps: AROM;Both;15 reps;Supine Quad Sets: AROM;Both;10 reps;Supine Heel  Slides: AAROM;15 reps;Right;Supine Straight Leg Raises: AAROM;AROM;Right;15 reps;Supine    General Comments        Pertinent Vitals/Pain Pain Assessment: 0-10 Pain Score: 5  Pain Location: R knee Pain Descriptors / Indicators: Aching;Sore Pain Intervention(s): Limited activity within patient's tolerance;Monitored during session;Premedicated before session;Ice applied    Home Living                      Prior Function            PT Goals (current goals can now be found in the care plan section) Acute Rehab PT Goals Patient Stated Goal: Resume previous lifestyle with decreased pain PT Goal Formulation: With patient Time For Goal Achievement: 08/18/15 Potential to Achieve Goals: Good Progress towards PT goals: Progressing toward goals    Frequency  7X/week    PT Plan Current plan remains appropriate    Co-evaluation             End of Session Equipment Utilized During Treatment: Gait belt Activity Tolerance: Patient tolerated treatment well Patient left: with call bell/phone within reach;in bed     Time: 1420-1445 PT Time Calculation (min) (ACUTE ONLY): 25 min  Charges:  $Gait Training: 8-22 mins $Therapeutic Exercise: 8-22 mins                    G Codes:      Brieann Osinski 2015-08-22, 2:33  PM

## 2015-08-14 NOTE — Care Management Note (Signed)
Case Management Note  Patient Details  Name: [name] MRN: [id number] Date of Birth: 06/11/1941  Subjective/Objective:     PT recommends home health therapy, rolling walker, and 3-N-1.  Discussed with spouse and provided information re copay for equipment.  Referral made to [facility] per choice.                       Expected Discharge Plan:  [place]  Discharge planning Services  CM Consult  Post Acute Care Choice:  Durable Medical Equipment, Home Health Choice offered to:  Spouse  DME Arranged:  Walker rolling, 3-N-1 DME Agency:   [place] Arranged:  PT HH Agency:  [place]  Status of Service:  Completed, signed off  Girard Cooter, [place] 08/14/2015, 10:37 AM

## 2015-08-14 NOTE — Progress Notes (Signed)
Physical Therapy Treatment Patient Details Name: [name] MRN: [id number] DOB: 1940/12/20 Today's Date: 08/14/2015    History of Present Illness R TKR.  Pt wtih hx of Bil RCR and COPD    PT Comments    Pt progressing with mobility - ltd this am by mild dizziness.  Follow Up Recommendations  Home health PT     Equipment Recommendations  Rolling walker with 5" wheels    Recommendations for Other Services OT consult     Precautions / Restrictions Precautions Precautions: Fall Restrictions Weight Bearing Restrictions: No Other Position/Activity Restrictions: WBAT    Mobility  Bed Mobility         Supine to sit: Min assist     General bed mobility comments: OOB with OT  Transfers Overall transfer level: Needs assistance Equipment used: Rolling walker (2 wheeled) Transfers: Sit to/from Stand Sit to Stand: Min guard         General transfer comment: cues for UE/LE placement  Ambulation/Gait Ambulation/Gait assistance: Min assist;Min guard Ambulation Distance (Feet): 62 Feet Assistive device: Rolling walker (2 wheeled) Gait Pattern/deviations: Step-to pattern;Decreased step length - right;Decreased step length - left;Shuffle;Trunk flexed Gait velocity: decr   General Gait Details: cues for posture, sequence and position from RW; distance ltd by onset mild dizziness   Stairs            Wheelchair Mobility    Modified Rankin (Stroke Patients Only)       Balance                                    Cognition Arousal/Alertness: Awake/alert Behavior During Therapy: WFL for tasks assessed/performed Overall Cognitive Status: Within Functional Limits for tasks assessed                      Exercises Total Joint Exercises Ankle Circles/Pumps: AROM;Both;15 reps;Supine Quad Sets: AROM;Both;10 reps;Supine Heel Slides: AAROM;15 reps;Right;Supine Straight Leg Raises: AAROM;AROM;Right;15 reps;Supine Goniometric ROM: AROM  at R knee -10 - 40 pain ltd with muscle guarding    General Comments        Pertinent Vitals/Pain Pain Assessment: 0-10 Pain Score: 4  Pain Location: R knee Pain Descriptors / Indicators: Aching;Sore Pain Intervention(s): Limited activity within patient's tolerance;Monitored during session;Premedicated before session;Ice applied    Home Living Family/patient expects to be discharged to:: Private residence Living Arrangements: Spouse/significant other Available Help at Discharge: Family                Prior Function Level of Independence: Independent          PT Goals (current goals can now be found in the care plan section) Acute Rehab PT Goals Patient Stated Goal: Resume previous lifestyle with decreased pain PT Goal Formulation: With patient Time For Goal Achievement: 08/18/15 Potential to Achieve Goals: Good Progress towards PT goals: Progressing toward goals    Frequency  7X/week    PT Plan Current plan remains appropriate    Co-evaluation             End of Session Equipment Utilized During Treatment: Gait belt;Right knee immobilizer Activity Tolerance: Patient tolerated treatment well Patient left: in chair;with call bell/phone within reach;with family/visitor present     Time: 4982-6415 PT Time Calculation (min) (ACUTE ONLY): 26 min  Charges:  $Gait Training: 8-22 mins $Therapeutic Exercise: 8-22 mins  G Codes:      Caspar Favila Sep 08, 2015, 12:49 PM

## 2015-08-14 NOTE — Evaluation (Signed)
Occupational Therapy Evaluation Patient Details Name: [name] MRN: [id number] DOB: 1940-10-05 Today's Date: 08/14/2015    History of Present Illness R TKR.  Pt wtih hx of Bil RCR and COPD   Clinical Impression   This 75 year old man was admitted for the above sx. All education was completed. No further OT is needed at this time.    Follow Up Recommendations  No OT follow up    Equipment Recommendations  3 in 1 bedside comode (please let pt/wife know if there is a co-pay)    Recommendations for Other Services       Precautions / Restrictions Precautions Precautions: Fall Restrictions Other Position/Activity Restrictions: WBAT      Mobility Bed Mobility         Supine to sit: Min assist     General bed mobility comments: assist for RLE  Transfers   Equipment used: Rolling walker (2 wheeled) Transfers: Sit to/from Stand Sit to Stand: Min guard         General transfer comment: cues for UE/LE placement    Balance                                            ADL Overall ADL's : Needs assistance/impaired     Grooming: Set up;Sitting   Upper Body Bathing: Set up;Sitting   Lower Body Bathing: Minimal assistance;Sit to/from stand   Upper Body Dressing : Set up;Sitting   Lower Body Dressing: Moderate assistance;Sit to/from stand   Toilet Transfer: Ambulation;Comfort height toilet;Grab bars;Minimal assistance   Toileting- Clothing Manipulation and Hygiene: Min guard;Sit to/from stand   Tub/ Shower Transfer: Walk-in shower;Min guard;Ambulation     General ADL Comments: pt initially dizzy sitting EOB BP 106/66.  After walking out of bathroom, he felt lightheaded and flushed.  Sitting BP 115/70.  Educated on AE.  Pt has a reacher and long netted sponge on a stick. Wife is interested in longer sponge.  Educated on placement of 3:1 in shower.  Pt would benefit from this over toilet for increased independence     Vision      Perception     Praxis      Pertinent Vitals/Pain Pain Score: 2  Pain Location: R knee Pain Descriptors / Indicators: Aching Pain Intervention(s): Limited activity within patient's tolerance;Monitored during session;Premedicated before session;Repositioned;Ice applied     Hand Dominance     Extremity/Trunk Assessment Upper Extremity Assessment Upper Extremity Assessment: Overall WFL for tasks assessed           Communication Communication Communication: No difficulties   Cognition Arousal/Alertness: Awake/alert Behavior During Therapy: WFL for tasks assessed/performed Overall Cognitive Status: Within Functional Limits for tasks assessed                     General Comments       Exercises       Shoulder Instructions      Home Living Family/patient expects to be discharged to:: Private residence Living Arrangements: Spouse/significant other Available Help at Discharge: Family               Bathroom Shower/Tub: Walk-in shower;Tub/shower unit   Bathroom Toilet: Handicapped height                Prior Functioning/Environment Level of Independence: Independent  OT Diagnosis: Generalized weakness   OT Problem List:     OT Treatment/Interventions:      OT Goals(Current goals can be found in the care plan section) Acute Rehab OT Goals Patient Stated Goal: Resume previous lifestyle with decreased pain OT Goal Formulation: All assessment and education complete, DC therapy  OT Frequency:     Barriers to D/C:            Co-evaluation              End of Session Nurse Communication:  (lightheaded; BPs WFLs)  Activity Tolerance: Patient tolerated treatment well Patient left: in chair;with call bell/phone within reach;with chair alarm set   Time: 2542-7062 OT Time Calculation (min): 33 min Charges:  OT General Charges $OT Visit: 1 Procedure OT Evaluation $OT Eval Low Complexity: 1 Procedure OT Treatments $Self  Care/Home Management : 8-22 mins G-Codes:    Janet Decesare 08-31-2015, 9:31 AM  Lesle Chris, OTR/L [phone] August 31, 2015

## 2015-08-14 NOTE — Progress Notes (Signed)
   Subjective: 1 Day Post-Op Procedure(s) (LRB): RIGHT TOTAL KNEE ARTHROPLASTY (Right) Patient reports pain as mild.   Plan is to go Home after hospital stay.  Objective: Vital signs in last 24 hours: Temp:  [97.6 F (36.4 C)-98 F (36.7 C)] 98 F (36.7 C) (01/07 0525) Pulse Rate:  [74-81] 76 (01/06 0920) Resp:  [16-19] 16 (01/07 0525) BP: (100-128)/(60-74) 114/74 mmHg (01/07 0525) SpO2:  [95 %-100 %] 96 % (01/07 0525) Weight:  [75.751 kg (167 lb)] 75.751 kg (167 lb) (01/06 0932)  Intake/Output from previous day:  Intake/Output Summary (Last 24 hours) at 08/14/15 0721 Last data filed at 08/14/15 0526  Gross per 24 hour  Intake   3200 ml  Output   3325 ml  Net   -125 ml    Intake/Output this shift:    Labs:  Recent Labs  08/14/15 0520  HGB 10.6*    Recent Labs  08/14/15 0520  WBC 20.3*  RBC 3.82*  HCT 32.6*  PLT 321    Recent Labs  08/14/15 0520  NA 141  K 4.5  CL 111  CO2 25  BUN 13  CREATININE 0.93  GLUCOSE 126*  CALCIUM 8.8*   No results for input(s): LABPT, INR in the last 72 hours.  EXAM General - Patient is Alert, Appropriate and Oriented Extremity - Neurologically intact Neurovascular intact No cellulitis present Compartment soft Dressing - dressing C/D/I Motor Function - intact, moving foot and toes well on exam.  Hemovac pulled without difficulty.  Past Medical History  Diagnosis Date  . Ulcerative colitis   . Asthma   . Chronic bronchitis   . Sinusitis   . Peripheral edema   . Hearing loss in left ear   . COPD (chronic obstructive pulmonary disease) (HCC)     Assessment/Plan: 1 Day Post-Op Procedure(s) (LRB): RIGHT TOTAL KNEE ARTHROPLASTY (Right) Principal Problem:   OA (osteoarthritis) of knee   Advance diet Up with therapy D/C IV fluids Plan for discharge tomorrow  DVT Prophylaxis - Xarelto Weight-Bearing as tolerated to right leg   [name] 08/14/2015, 7:21 AM

## 2015-08-15 LAB — CBC
HCT: 31.4 % — ABNORMAL LOW (ref 39.0–52.0)
Hemoglobin: 10.3 g/dL — ABNORMAL LOW (ref 13.0–17.0)
MCH: 27.8 pg (ref 26.0–34.0)
MCHC: 32.8 g/dL (ref 30.0–36.0)
MCV: 84.9 fL (ref 78.0–100.0)
PLATELETS: 329 10*3/uL (ref 150–400)
RBC: 3.7 MIL/uL — AB (ref 4.22–5.81)
RDW: 16.5 % — ABNORMAL HIGH (ref 11.5–15.5)
WBC: 19.4 10*3/uL — AB (ref 4.0–10.5)

## 2015-08-15 LAB — BASIC METABOLIC PANEL
Anion gap: 8 (ref 5–15)
BUN: 15 mg/dL (ref 6–20)
CO2: 26 mmol/L (ref 22–32)
Calcium: 8.8 mg/dL — ABNORMAL LOW (ref 8.9–10.3)
Chloride: 105 mmol/L (ref 101–111)
Creatinine, Ser: 0.88 mg/dL (ref 0.61–1.24)
GFR calc Af Amer: >60 mL/min (ref >60)
Glucose, Bld: 122 mg/dL — ABNORMAL HIGH (ref 65–99)
POTASSIUM: 4.6 mmol/L (ref 3.5–5.1)
SODIUM: 139 mmol/L (ref 135–145)

## 2015-08-15 NOTE — Progress Notes (Signed)
Physical Therapy Treatment Patient Details Name: KEIGHAN AMEZCUA MRN: [id number] DOB: 02/09/1941 Today's Date: 28-Aug-2015    History of Present Illness R TKR.  Pt wtih hx of Bil RCR and COPD    PT Comments    Pt progressing well with mobility.  Follow Up Recommendations  Home health PT     Equipment Recommendations  Rolling walker with 5" wheels    Recommendations for Other Services OT consult     Precautions / Restrictions Precautions Precautions: Fall Restrictions Weight Bearing Restrictions: No Other Position/Activity Restrictions: WBAT    Mobility  Bed Mobility Overal bed mobility: Needs Assistance Bed Mobility: Supine to Sit     Supine to sit: Min guard     General bed mobility comments: cues for sequence and use of L LE to self assist  Transfers Overall transfer level: Needs assistance Equipment used: Rolling walker (2 wheeled) Transfers: Sit to/from Stand Sit to Stand: Min guard         General transfer comment: cues for UE/LE placement  Ambulation/Gait Ambulation/Gait assistance: Min guard Ambulation Distance (Feet): 140 Feet Assistive device: Rolling walker (2 wheeled) Gait Pattern/deviations: Step-to pattern;Shuffle;Trunk flexed Gait velocity: decr   General Gait Details: mincues for posture, sequence and position from RW; distance ltd by onset mild dizziness   Stairs            Wheelchair Mobility    Modified Rankin (Stroke Patients Only)       Balance                                    Cognition Arousal/Alertness: Awake/alert Behavior During Therapy: WFL for tasks assessed/performed Overall Cognitive Status: Within Functional Limits for tasks assessed                      Exercises Total Joint Exercises Ankle Circles/Pumps: AROM;Both;15 reps;Supine Quad Sets: AROM;Both;Supine;15 reps Heel Slides: AAROM;15 reps;Right;Supine Straight Leg Raises: AAROM;AROM;Right;Supine;20 reps Goniometric ROM:  AAROM at R knee -10- 45    General Comments        Pertinent Vitals/Pain Pain Assessment: 0-10 Pain Score: 5  Pain Location: R knee Pain Descriptors / Indicators: Aching;Sore Pain Intervention(s): Limited activity within patient's tolerance;Monitored during session;Premedicated before session;Ice applied    Home Living                      Prior Function            PT Goals (current goals can now be found in the care plan section) Acute Rehab PT Goals Patient Stated Goal: Resume previous lifestyle with decreased pain PT Goal Formulation: With patient Time For Goal Achievement: 08/18/15 Potential to Achieve Goals: Good Progress towards PT goals: Progressing toward goals    Frequency  7X/week    PT Plan Current plan remains appropriate    Co-evaluation             End of Session Equipment Utilized During Treatment: Gait belt Activity Tolerance: Patient tolerated treatment well Patient left: with call bell/phone within reach;in chair;with family/visitor present     Time: 5993-5701 PT Time Calculation (min) (ACUTE ONLY): 33 min  Charges:  $Gait Training: 8-22 mins $Therapeutic Exercise: 8-22 mins                    G Codes:      Gaines Cartmell 2015/08/28, 12:25 PM

## 2015-08-15 NOTE — Progress Notes (Signed)
Physical Therapy Treatment Patient Details Name: [name] MRN: [id number] DOB: 1941/05/11 Today's Date: 08/15/2015    History of Present Illness R TKR.  Pt wtih hx of Bil RCR and COPD    PT Comments    Pt progressing well with mobility.  Reviewed stairs with rail and crutch.  Follow Up Recommendations  Home health PT     Equipment Recommendations  Rolling walker with 5" wheels    Recommendations for Other Services OT consult     Precautions / Restrictions Precautions Precautions: Fall Restrictions Weight Bearing Restrictions: No Other Position/Activity Restrictions: WBAT    Mobility  Bed Mobility Overal bed mobility: Needs Assistance Bed Mobility: Supine to Sit;Sit to Supine     Supine to sit: Min guard Sit to supine: Min guard   General bed mobility comments: cues for sequence and use of L LE to self assist  Transfers Overall transfer level: Needs assistance Equipment used: Rolling walker (2 wheeled) Transfers: Sit to/from Stand Sit to Stand: Supervision         General transfer comment: cues for UE/LE placement  Ambulation/Gait Ambulation/Gait assistance: Min guard;Supervision Ambulation Distance (Feet): 140 Feet Assistive device: Rolling walker (2 wheeled) Gait Pattern/deviations: Step-to pattern;Step-through pattern;Shuffle;Trunk flexed;Decreased step length - right;Decreased step length - left Gait velocity: decr   General Gait Details: min cues for posture, sequence and position from RW; distance ltd by onset mild dizziness   Stairs Stairs: Yes Stairs assistance: Min assist Stair Management: One rail Right;Step to pattern;Forwards;With crutches Number of Stairs: 7 General stair comments: cues for sequence and crutch/foot placement  Wheelchair Mobility    Modified Rankin (Stroke Patients Only)       Balance                                    Cognition Arousal/Alertness: Awake/alert Behavior During Therapy: WFL  for tasks assessed/performed Overall Cognitive Status: Within Functional Limits for tasks assessed                      Exercises Total Joint Exercises Ankle Circles/Pumps: AROM;Both;15 reps;Supine Quad Sets: AROM;Both;Supine;15 reps Heel Slides: AAROM;15 reps;Right;Supine Straight Leg Raises: AAROM;AROM;Right;Supine;20 reps Goniometric ROM: AAROM at R knee -10 - 60    General Comments        Pertinent Vitals/Pain Pain Assessment: 0-10 Pain Score: 5  Pain Location: R knee Pain Descriptors / Indicators: Aching;Sore Pain Intervention(s): Limited activity within patient's tolerance;Monitored during session;Premedicated before session;Ice applied    Home Living                      Prior Function            PT Goals (current goals can now be found in the care plan section) Acute Rehab PT Goals Patient Stated Goal: Resume previous lifestyle with decreased pain PT Goal Formulation: With patient Time For Goal Achievement: 08/18/15 Potential to Achieve Goals: Good Progress towards PT goals: Progressing toward goals    Frequency  7X/week    PT Plan Current plan remains appropriate    Co-evaluation             End of Session Equipment Utilized During Treatment: Gait belt Activity Tolerance: Patient tolerated treatment well Patient left: with call bell/phone within reach;in chair;with family/visitor present     Time: 1322-1400 PT Time Calculation (min) (ACUTE ONLY): 38 min  Charges:  $Gait Training:  8-22 mins $Therapeutic Exercise: 8-22 mins $Therapeutic Activity: 8-22 mins                    G Codes:      [name] 09-02-15, 4:36 PM

## 2015-08-15 NOTE — Progress Notes (Signed)
Subjective: 2 Days Post-Op Procedure(s) (LRB): RIGHT TOTAL KNEE ARTHROPLASTY (Right) Patient reports pain as 3 on 0-10 scale.   Comfortable in bed with wife helping. Objective: Vital signs in last 24 hours: Temp:  [97.8 F (36.6 C)-98.3 F (36.8 C)] 98.3 F (36.8 C) (01/08 0600) Pulse Rate:  [62-78] 69 (01/08 0600) Resp:  [16] 16 (01/08 0600) BP: (105-136)/(65-76) 132/76 mmHg (01/08 0600) SpO2:  [95 %-99 %] 96 % (01/08 0600)  Intake/Output from previous day: 01/07 0701 - 01/08 0700 In: 720 [P.O.:720] Out: 1525 [Urine:1525] Intake/Output this shift:     Recent Labs  08/14/15 0520 08/15/15 0425  HGB 10.6* 10.3*    Recent Labs  08/14/15 0520 08/15/15 0425  WBC 20.3* 19.4*  RBC 3.82* 3.70*  HCT 32.6* 31.4*  PLT 321 329    Recent Labs  08/14/15 0520 08/15/15 0425  NA 141 139  K 4.5 4.6  CL 111 105  CO2 25 26  BUN 13 15  CREATININE 0.93 0.88  GLUCOSE 126* 122*  CALCIUM 8.8* 8.8*   No results for input(s): LABPT, INR in the last 72 hours.  Neurovascular intact Sensation intact distally Intact pulses distally Dorsiflexion/Plantar flexion intact Incision: dressing C/D/I No cellulitis present Compartment soft  Assessment/Plan: 2 Days Post-Op Procedure(s) (LRB): RIGHT TOTAL KNEE ARTHROPLASTY (Right) Up with therapy Plan for discharge tomorrow Doing well. Unable to get home due to conditions. [place] ANDREW 08/15/2015, 9:54 AM

## 2015-08-16 ENCOUNTER — Encounter (HOSPITAL_COMMUNITY): Payer: Self-pay | Admitting: Orthopedic Surgery

## 2015-08-16 LAB — CBC
HCT: 30.6 % — ABNORMAL LOW (ref 39.0–52.0)
Hemoglobin: 10.1 g/dL — ABNORMAL LOW (ref 13.0–17.0)
MCH: 27.8 pg (ref 26.0–34.0)
MCHC: 33 g/dL (ref 30.0–36.0)
MCV: 84.3 fL (ref 78.0–100.0)
PLATELETS: 307 10*3/uL (ref 150–400)
RBC: 3.63 MIL/uL — ABNORMAL LOW (ref 4.22–5.81)
RDW: 16.7 % — AB (ref 11.5–15.5)
WBC: 14.4 10*3/uL — AB (ref 4.0–10.5)

## 2015-08-16 NOTE — Progress Notes (Signed)
Physical Therapy Treatment Patient Details Name: [name] MRN: [id number] DOB: Apr 27, 1941 Today's Date: 08/16/2015    History of Present Illness R TKR.  Pt wtih hx of Bil RCR and COPD    PT Comments    POD # 3 Assisted OOB to amb in hallway.  Performed all supine TKR TE's following HEP.  Instructed on proper tech and freq. Instructed on use of ICE.  Pt performed stairs day prior and did not feel need to repeat.    Follow Up Recommendations  Home health PT     Equipment Recommendations  Rolling walker with 5" wheels    Recommendations for Other Services       Precautions / Restrictions Precautions Precautions: Fall Restrictions Weight Bearing Restrictions: No    Mobility  Bed Mobility Overal bed mobility: Needs Assistance Bed Mobility: Supine to Sit;Sit to Supine     Supine to sit: Supervision;Min guard Sit to supine: Supervision;Min guard   General bed mobility comments: increased time and pt uses opposite LE to assist R LE off/on bed  Transfers Overall transfer level: Needs assistance Equipment used: Rolling walker (2 wheeled) Transfers: Sit to/from Stand Sit to Stand: Supervision         General transfer comment: increased time  Ambulation/Gait Ambulation/Gait assistance: Supervision;Min guard Ambulation Distance (Feet): 145 Feet Assistive device: Rolling walker (2 wheeled) Gait Pattern/deviations: Step-to pattern;Decreased stance time - right Gait velocity: decr   General Gait Details: min cues for posture, sequence and position from RW   Stairs Stairs:  (pt performed stairs yesterday and he did not feel need to repeat)          Wheelchair Mobility    Modified Rankin (Stroke Patients Only)       Balance                                    Cognition Arousal/Alertness: Awake/alert Behavior During Therapy: WFL for tasks assessed/performed Overall Cognitive Status: Within Functional Limits for tasks assessed                       Exercises      General Comments        Pertinent Vitals/Pain Pain Assessment: 0-10 Pain Score: 5  Pain Location: R knee Pain Descriptors / Indicators: Aching;Sore;Tender Pain Intervention(s): Monitored during session;Premedicated before session;Repositioned;Ice applied    Home Living                      Prior Function            PT Goals (current goals can now be found in the care plan section) Progress towards PT goals: Progressing toward goals    Frequency  7X/week    PT Plan Current plan remains appropriate    Co-evaluation             End of Session Equipment Utilized During Treatment: Gait belt Activity Tolerance: Patient tolerated treatment well Patient left: with call bell/phone within reach;with family/visitor present;in bed     Time: 0940-1020 PT Time Calculation (min) (ACUTE ONLY): 40 min  Charges:  $Gait Training: 8-22 mins $Therapeutic Exercise: 8-22 mins $Therapeutic Activity: 8-22 mins                    G Codes:      Rica Koyanagi  PTA [facility]  Acute  Rehab Pager  [phone]  

## 2015-08-16 NOTE — Progress Notes (Signed)
   Subjective: 3 Days Post-Op Procedure(s) (LRB): RIGHT TOTAL KNEE ARTHROPLASTY (Right) Patient reports pain as mild.   Patient seen in rounds by Dr. Wynelle Link. Patient is well, and has had no acute complaints or problems Patient is ready to go home  Objective: Vital signs in last 24 hours: Temp:  [98 F (36.7 C)-101 F (38.3 C)] 98 F (36.7 C) (01/09 0459) Pulse Rate:  [74-91] 74 (01/09 0459) Resp:  [16-18] 18 (01/09 0459) BP: (114-132)/(67-72) 114/67 mmHg (01/09 0459) SpO2:  [94 %-97 %] 96 % (01/09 0557)  Intake/Output from previous day:  Intake/Output Summary (Last 24 hours) at 08/16/15 0728 Last data filed at 08/16/15 0500  Gross per 24 hour  Intake    720 ml  Output   1550 ml  Net   -830 ml    Labs:  Recent Labs  08/14/15 0520 08/15/15 0425 08/16/15 0353  HGB 10.6* 10.3* 10.1*    Recent Labs  08/15/15 0425 08/16/15 0353  WBC 19.4* 14.4*  RBC 3.70* 3.63*  HCT 31.4* 30.6*  PLT 329 307    Recent Labs  08/14/15 0520 08/15/15 0425  NA 141 139  K 4.5 4.6  CL 111 105  CO2 25 26  BUN 13 15  CREATININE 0.93 0.88  GLUCOSE 126* 122*  CALCIUM 8.8* 8.8*   No results for input(s): LABPT, INR in the last 72 hours.  EXAM: General - Patient is Alert, Appropriate and Oriented Extremity - Neurovascular intact Sensation intact distally Dorsiflexion/Plantar flexion intact Incision - clean, dry, no drainage Motor Function - intact, moving foot and toes well on exam.   Assessment/Plan: 3 Days Post-Op Procedure(s) (LRB): RIGHT TOTAL KNEE ARTHROPLASTY (Right) Procedure(s) (LRB): RIGHT TOTAL KNEE ARTHROPLASTY (Right) Past Medical History  Diagnosis Date  . Ulcerative colitis   . Asthma   . Chronic bronchitis   . Sinusitis   . Peripheral edema   . Hearing loss in left ear   . COPD (chronic obstructive pulmonary disease) (HCC)    Principal Problem:   OA (osteoarthritis) of knee  Estimated body mass index is 24.32 kg/(m^2) as calculated from the  following:   Height as of this encounter: 5' 9.5" (1.765 m).   Weight as of this encounter: 75.751 kg (167 lb). Up with therapy Discharge home with home health Diet - Regular diet Follow up - in 2 weeks Activity - WBAT Disposition - Home Condition Upon Discharge - Good D/C Meds - See DC Summary DVT Prophylaxis - Xarelto  Arlee Muslim, PA-C Orthopaedic Surgery 08/16/2015, 7:28 AM

## 2015-08-16 NOTE — Discharge Summary (Signed)
Physician Discharge Summary   Patient ID: [name] MRN: [id number] DOB/AGE: 11/20/1940 75 y.o.  Admit date: 08/13/2015 Discharge date: 08-16-2015  Primary Diagnosis:  Osteoarthritis Right knee(s)  Admission Diagnoses:  Past Medical History  Diagnosis Date  . Ulcerative colitis   . Asthma   . Chronic bronchitis   . Sinusitis   . Peripheral edema   . Hearing loss in left ear   . COPD (chronic obstructive pulmonary disease) ([place])    Discharge Diagnoses:   Principal Problem:   OA (osteoarthritis) of knee  Estimated body mass index is 24.32 kg/(m^2) as calculated from the following:   Height as of this encounter: 5' 9.5" (1.765 m).   Weight as of this encounter: 75.751 kg (167 lb).  Procedure:  Procedure(s) (LRB): RIGHT TOTAL KNEE ARTHROPLASTY (Right)   Consults: None  HPI: [name] is a 75 y.o. year old male with end stage OA of his right knee with progressively worsening pain and dysfunction. He has constant pain, with activity and at rest and significant functional deficits with difficulties even with ADLs. He has had extensive non-op management including analgesics, injections of cortisone and viscosupplements, and home exercise program, but remains in significant pain with significant dysfunction. Radiographs show bone on bone arthritis medial and patellofemoral. He presents now for right Total Knee Arthroplasty.   Laboratory Data: Admission on 08/13/2015  Component Date Value Ref Range Status  . WBC 08/14/2015 20.3* 4.0 - 10.5 K/uL Final  . RBC 08/14/2015 3.82* 4.22 - 5.81 MIL/uL Final  . Hemoglobin 08/14/2015 10.6* 13.0 - 17.0 g/dL Final  . HCT 08/14/2015 32.6* 39.0 - 52.0 % Final  . MCV 08/14/2015 85.3  78.0 - 100.0 fL Final  . MCH 08/14/2015 27.7  26.0 - 34.0 pg Final  . MCHC 08/14/2015 32.5  30.0 - 36.0 g/dL Final  . RDW 08/14/2015 16.3* 11.5 - 15.5 % Final  . Platelets 08/14/2015 321  150 - 400 K/uL Final  . Sodium 08/14/2015 141  135 - 145 mmol/L  Final  . Potassium 08/14/2015 4.5  3.5 - 5.1 mmol/L Final  . Chloride 08/14/2015 111  101 - 111 mmol/L Final  . CO2 08/14/2015 25  22 - 32 mmol/L Final  . Glucose, Bld 08/14/2015 126* 65 - 99 mg/dL Final  . BUN 08/14/2015 13  6 - 20 mg/dL Final  . Creatinine, Ser 08/14/2015 0.93  0.61 - 1.24 mg/dL Final  . Calcium 08/14/2015 8.8* 8.9 - 10.3 mg/dL Final  . GFR calc non Af Amer 08/14/2015 >60  >60 mL/min Final  . GFR calc Af Amer 08/14/2015 >60  >60 mL/min Final   Comment: (NOTE) The eGFR has been calculated using the CKD EPI equation. This calculation has not been validated in all clinical situations. eGFR's persistently <60 mL/min signify possible Chronic Kidney Disease.   . Anion gap 08/14/2015 5  5 - 15 Final  . WBC 08/15/2015 19.4* 4.0 - 10.5 K/uL Final  . RBC 08/15/2015 3.70* 4.22 - 5.81 MIL/uL Final  . Hemoglobin 08/15/2015 10.3* 13.0 - 17.0 g/dL Final  . HCT 08/15/2015 31.4* 39.0 - 52.0 % Final  . MCV 08/15/2015 84.9  78.0 - 100.0 fL Final  . MCH 08/15/2015 27.8  26.0 - 34.0 pg Final  . MCHC 08/15/2015 32.8  30.0 - 36.0 g/dL Final  . RDW 08/15/2015 16.5* 11.5 - 15.5 % Final  . Platelets 08/15/2015 329  150 - 400 K/uL Final  . Sodium 08/15/2015 139  135 - 145 mmol/L  Final  . Potassium 08/15/2015 4.6  3.5 - 5.1 mmol/L Final  . Chloride 08/15/2015 105  101 - 111 mmol/L Final  . CO2 08/15/2015 26  22 - 32 mmol/L Final  . Glucose, Bld 08/15/2015 122* 65 - 99 mg/dL Final  . BUN 08/15/2015 15  6 - 20 mg/dL Final  . Creatinine, Ser 08/15/2015 0.88  0.61 - 1.24 mg/dL Final  . Calcium 08/15/2015 8.8* 8.9 - 10.3 mg/dL Final  . GFR calc non Af Amer 08/15/2015 >60  >60 mL/min Final  . GFR calc Af Amer 08/15/2015 >60  >60 mL/min Final   Comment: (NOTE) The eGFR has been calculated using the CKD EPI equation. This calculation has not been validated in all clinical situations. eGFR's persistently <60 mL/min signify possible Chronic Kidney Disease.   . Anion gap 08/15/2015 8  5 - 15  Final  . WBC 08/16/2015 14.4* 4.0 - 10.5 K/uL Final  . RBC 08/16/2015 3.63* 4.22 - 5.81 MIL/uL Final  . Hemoglobin 08/16/2015 10.1* 13.0 - 17.0 g/dL Final  . HCT 08/16/2015 30.6* 39.0 - 52.0 % Final  . MCV 08/16/2015 84.3  78.0 - 100.0 fL Final  . MCH 08/16/2015 27.8  26.0 - 34.0 pg Final  . MCHC 08/16/2015 33.0  30.0 - 36.0 g/dL Final  . RDW 08/16/2015 16.7* 11.5 - 15.5 % Final  . Platelets 08/16/2015 307  150 - 400 K/uL Final  Hospital Outpatient Visit on 08/05/2015  Component Date Value Ref Range Status  . aPTT 08/05/2015 34  24 - 37 seconds Final  . WBC 08/05/2015 11.3* 4.0 - 10.5 K/uL Final  . RBC 08/05/2015 4.61  4.22 - 5.81 MIL/uL Final  . Hemoglobin 08/05/2015 12.5* 13.0 - 17.0 g/dL Final  . HCT 08/05/2015 38.5* 39.0 - 52.0 % Final  . MCV 08/05/2015 83.5  78.0 - 100.0 fL Final  . MCH 08/05/2015 27.1  26.0 - 34.0 pg Final  . MCHC 08/05/2015 32.5  30.0 - 36.0 g/dL Final  . RDW 08/05/2015 16.0* 11.5 - 15.5 % Final  . Platelets 08/05/2015 367  150 - 400 K/uL Final  . Sodium 08/05/2015 137  135 - 145 mmol/L Final  . Potassium 08/05/2015 4.1  3.5 - 5.1 mmol/L Final  . Chloride 08/05/2015 103  101 - 111 mmol/L Final  . CO2 08/05/2015 25  22 - 32 mmol/L Final  . Glucose, Bld 08/05/2015 95  65 - 99 mg/dL Final  . BUN 08/05/2015 18  6 - 20 mg/dL Final  . Creatinine, Ser 08/05/2015 1.06  0.61 - 1.24 mg/dL Final  . Calcium 08/05/2015 9.4  8.9 - 10.3 mg/dL Final  . Total Protein 08/05/2015 6.7  6.5 - 8.1 g/dL Final  . Albumin 08/05/2015 3.5  3.5 - 5.0 g/dL Final  . AST 08/05/2015 20  15 - 41 U/L Final  . ALT 08/05/2015 21  17 - 63 U/L Final  . Alkaline Phosphatase 08/05/2015 74  38 - 126 U/L Final  . Total Bilirubin 08/05/2015 0.4  0.3 - 1.2 mg/dL Final  . GFR calc non Af Amer 08/05/2015 >60  >60 mL/min Final  . GFR calc Af Amer 08/05/2015 >60  >60 mL/min Final   Comment: (NOTE) The eGFR has been calculated using the CKD EPI equation. This calculation has not been validated in all  clinical situations. eGFR's persistently <60 mL/min signify possible Chronic Kidney Disease.   . Anion gap 08/05/2015 9  5 - 15 Final  . Prothrombin Time 08/05/2015 14.0  11.6 -  15.2 seconds Final  . INR 08/05/2015 1.06  0.00 - 1.49 Final  . ABO/RH(D) 08/05/2015 A NEG   Final  . Antibody Screen 08/05/2015 NEG   Final  . Sample Expiration 08/05/2015 08/19/2015   Final  . Extend sample reason 08/05/2015 NO TRANSFUSIONS OR PREGNANCY IN THE PAST 3 MONTHS   Final  . Color, Urine 08/05/2015 YELLOW  YELLOW Final  . APPearance 08/05/2015 CLEAR  CLEAR Final  . Specific Gravity, Urine 08/05/2015 1.017  1.005 - 1.030 Final  . pH 08/05/2015 5.5  5.0 - 8.0 Final  . Glucose, UA 08/05/2015 NEGATIVE  NEGATIVE mg/dL Final  . Hgb urine dipstick 08/05/2015 NEGATIVE  NEGATIVE Final  . Bilirubin Urine 08/05/2015 NEGATIVE  NEGATIVE Final  . Ketones, ur 08/05/2015 NEGATIVE  NEGATIVE mg/dL Final  . Protein, ur 08/05/2015 NEGATIVE  NEGATIVE mg/dL Final  . Nitrite 08/05/2015 NEGATIVE  NEGATIVE Final  . Leukocytes, UA 08/05/2015 SMALL* NEGATIVE Final  . MRSA, PCR 08/05/2015 NEGATIVE  NEGATIVE Final  . Staphylococcus aureus 08/05/2015 NEGATIVE  NEGATIVE Final   Comment:        The Xpert SA Assay (FDA approved for NASAL specimens in patients over 67 years of age), is one component of a comprehensive surveillance program.  Test performance has been validated by [facility] for patients greater than or equal to 71 year old. It is not intended to diagnose infection nor to guide or monitor treatment.   . ABO/RH(D) 08/05/2015 A NEG   Final  . Squamous Epithelial / LPF 08/05/2015 0-5* NONE SEEN Final  . WBC, UA 08/05/2015 0-5  0 - 5 WBC/hpf Final  . RBC / HPF 08/05/2015 0-5  0 - 5 RBC/hpf Final  . Bacteria, UA 08/05/2015 RARE* NONE SEEN Final     X-Rays:No results found.  EKG: Orders placed or performed in visit on 05/30/12  . EKG 12-Lead     Hospital Course: IVORY BAIL is a 75 y.o. who was  admitted to [facility]. They were brought to the operating room on 08/13/2015 and underwent Procedure(s): RIGHT TOTAL KNEE ARTHROPLASTY.  Patient tolerated the procedure well and was later transferred to the recovery room and then to the orthopaedic floor for postoperative care.  They were given PO and IV analgesics for pain control following their surgery.  They were given 24 hours of postoperative antibiotics of  Anti-infectives    Start     Dose/Rate Route Frequency Ordered Stop   08/13/15 1300  ceFAZolin (ANCEF) IVPB 2 g/50 mL premix     2 g 100 mL/hr over 30 Minutes Intravenous Every 6 hours 08/13/15 0941 08/13/15 1904   08/13/15 0519  ceFAZolin (ANCEF) IVPB 2 g/50 mL premix     2 g 100 mL/hr over 30 Minutes Intravenous On call to O.R. 08/13/15 [id number] 08/13/15 0725     and started on DVT prophylaxis in the form of Xarelto.   PT and OT were ordered for total joint protocol.  Discharge planning consulted to help with postop disposition and equipment needs.  Patient had a decent night on the evening of surgery.  They started to get up OOB with therapy on day one. Hemovac drain was pulled without difficulty.  Continued to work with therapy into day two.  Dressing was changed on day two and the incision was healing well.  By day three, the patient had progressed with therapy and meeting their goals.  Incision was healing well.  Patient was seen in rounds and was ready  to go home.  Discharge home with home health Diet - Regular diet Follow up - in 2 weeks Activity - WBAT Disposition - Home Condition Upon Discharge - Good D/C Meds - See DC Summary DVT Prophylaxis - Xarelto  Discharge Instructions    Call MD / Call 911    Complete by:  As directed   If you experience chest pain or shortness of breath, CALL 911 and be transported to the hospital emergency room.  If you develope a fever above 101 F, pus (white drainage) or increased drainage or redness at the wound, or calf pain, call your  surgeon's office.     Change dressing    Complete by:  As directed   Change dressing daily with sterile 4 x 4 inch gauze dressing and apply TED hose. Do not submerge the incision under water.     Constipation Prevention    Complete by:  As directed   Drink plenty of fluids.  Prune juice may be helpful.  You may use a stool softener, such as Colace (over the counter) 100 mg twice a day.  Use MiraLax (over the counter) for constipation as needed.     Diet general    Complete by:  As directed      Discharge instructions    Complete by:  As directed   Pick up stool softner and laxative for home use following surgery while on pain medications. Do not submerge incision under water. Please use good hand washing techniques while changing dressing each day. May shower starting three days after surgery. Please use a clean towel to pat the incision dry following showers. Continue to use ice for pain and swelling after surgery. Do not use any lotions or creams on the incision until instructed by your surgeon.  Take Xarelto for two and a half more weeks, then discontinue Xarelto. Once the patient has completed the Xarelto, they may resume the 81 mg Aspirin.  Postoperative Constipation Protocol  Constipation - defined medically as fewer than three stools per week and severe constipation as less than one stool per week.  One of the most common issues patients have following surgery is constipation.  Even if you have a regular bowel pattern at home, your normal regimen is likely to be disrupted due to multiple reasons following surgery.  Combination of anesthesia, postoperative narcotics, change in appetite and fluid intake all can affect your bowels.  In order to avoid complications following surgery, here are some recommendations in order to help you during your recovery period.  Colace (docusate) - Pick up an over-the-counter form of Colace or another stool softener and take twice a day as long as you  are requiring postoperative pain medications.  Take with a full glass of water daily.  If you experience loose stools or diarrhea, hold the colace until you stool forms back up.  If your symptoms do not get better within 1 week or if they get worse, check with your doctor.  Dulcolax (bisacodyl) - Pick up over-the-counter and take as directed by the product packaging as needed to assist with the movement of your bowels.  Take with a full glass of water.  Use this product as needed if not relieved by Colace only.   MiraLax (polyethylene glycol) - Pick up over-the-counter to have on hand.  MiraLax is a solution that will increase the amount of water in your bowels to assist with bowel movements.  Take as directed and can mix with a glass  of water, juice, soda, coffee, or tea.  Take if you go more than two days without a movement. Do not use MiraLax more than once per day. Call your doctor if you are still constipated or irregular after using this medication for 7 days in a row.  If you continue to have problems with postoperative constipation, please contact the office for further assistance and recommendations.  If you experience "the worst abdominal pain ever" or develop nausea or vomiting, please contact the office immediatly for further recommendations for treatment.     Do not put a pillow under the knee. Place it under the heel.    Complete by:  As directed      Do not sit on low chairs, stoools or toilet seats, as it may be difficult to get up from low surfaces    Complete by:  As directed      Driving restrictions    Complete by:  As directed   No driving until released by the physician.     Increase activity slowly as tolerated    Complete by:  As directed      Lifting restrictions    Complete by:  As directed   No lifting until released by the physician.     Patient may shower    Complete by:  As directed   You may shower without a dressing once there is no drainage.  Do not wash over  the wound.  If drainage remains, do not shower until drainage stops.     TED hose    Complete by:  As directed   Use stockings (TED hose) for 3 weeks on both leg(s).  You may remove them at night for sleeping.     Weight bearing as tolerated    Complete by:  As directed   Laterality:  right  Extremity:  Lower            Medication List    STOP taking these medications        aspirin EC 81 MG tablet     CALCIUM 600 + D PO     Co-Enzyme Q10 100 MG Caps     FISH OIL PO     folic acid 1 MG tablet  Commonly known as:  FOLVITE     methotrexate 2.5 MG tablet  Commonly known as:  RHEUMATREX     VITAMIN B-12 PO      TAKE these medications        albuterol 108 (90 Base) MCG/ACT inhaler  Commonly known as:  PROVENTIL HFA;VENTOLIN HFA  Inhale 2 puffs into the lungs every 4 (four) hours as needed for wheezing or shortness of breath.     CLARITIN 10 MG tablet  Generic drug:  loratadine  Take 10 mg by mouth daily.     Fluticasone-Salmeterol 100-50 MCG/DOSE Aepb  Commonly known as:  ADVAIR  Inhale 1 puff into the lungs 2 (two) times daily.     methocarbamol 500 MG tablet  Commonly known as:  ROBAXIN  Take 1 tablet (500 mg total) by mouth every 6 (six) hours as needed for muscle spasms.     montelukast 10 MG tablet  Commonly known as:  SINGULAIR  Take 1 tablet (10 mg total) by mouth every evening.     MUCINEX 600 MG 12 hr tablet  Generic drug:  guaiFENesin  Take 600 mg by mouth daily.     oxyCODONE 5 MG immediate release tablet  Commonly known as:  Oxy IR/ROXICODONE  Take 1-2 tablets (5-10 mg total) by mouth every 3 (three) hours as needed for breakthrough pain.     predniSONE 5 MG tablet  Commonly known as:  DELTASONE  Take 2.5 mg by mouth daily with breakfast.     PROTONIX 40 MG tablet  Generic drug:  pantoprazole  Take 40 mg by mouth every evening.     rivaroxaban 10 MG Tabs tablet  Commonly known as:  XARELTO  Take 1 tablet (10 mg total) by mouth daily  with breakfast.     simvastatin 20 MG tablet  Commonly known as:  ZOCOR  Take 20 mg by mouth daily at 6 PM.     tamsulosin 0.4 MG Caps capsule  Commonly known as:  FLOMAX  Take 2 capsules (0.8 mg total) by mouth daily after supper.     traMADol 50 MG tablet  Commonly known as:  ULTRAM  Take 1-2 tablets (50-100 mg total) by mouth every 6 (six) hours as needed for moderate pain.     TYLENOL 500 MG tablet  Generic drug:  acetaminophen  Take 1,000 mg by mouth every 8 (eight) hours as needed for moderate pain.           Follow-up Information    Follow up with Gearlean Alf, MD. Schedule an appointment as soon as possible for a visit on 08/26/2015.   Specialty:  Orthopedic Surgery   Why:  Call [phone] tomorrow to make the appointment   Contact information:   [address] [place] [address] [phone]       Signed: Arlee Muslim, PA-C Orthopaedic Surgery 08/16/2015, 7:31 AM

## 2015-09-01 ENCOUNTER — Ambulatory Visit: Payer: Medicare Other | Attending: Orthopedic Surgery | Admitting: Physical Therapy

## 2015-09-01 DIAGNOSIS — M7989 Other specified soft tissue disorders: Secondary | ICD-10-CM | POA: Diagnosis not present

## 2015-09-01 DIAGNOSIS — R29898 Other symptoms and signs involving the musculoskeletal system: Secondary | ICD-10-CM | POA: Diagnosis present

## 2015-09-01 DIAGNOSIS — M25661 Stiffness of right knee, not elsewhere classified: Secondary | ICD-10-CM

## 2015-09-01 DIAGNOSIS — M25561 Pain in right knee: Secondary | ICD-10-CM | POA: Diagnosis present

## 2015-09-01 NOTE — Therapy (Signed)
[place], [place], [address] Phone: [phone]   Fax:  [phone]  Physical Therapy Evaluation  Patient Details  Name: [name] MRN: [id number] Date of Birth: 26-Feb-1941 Referring Provider: Wynelle Link  Encounter Date: 09/01/2015      PT End of Session - 09/01/15 1430    Visit Number 1   Date for PT Re-Evaluation 10/30/15   PT Start Time 1345   PT Stop Time 1442   PT Time Calculation (min) 57 min   Activity Tolerance Patient tolerated treatment well   Behavior During Therapy [facility] for tasks assessed/performed      Past Medical History  Diagnosis Date  . Ulcerative colitis   . Asthma   . Chronic bronchitis   . Sinusitis   . Peripheral edema   . Hearing loss in left ear   . COPD (chronic obstructive pulmonary disease) [facility])     Past Surgical History  Procedure Laterality Date  . Rotator cuff repair      bilateral shoulders   . Hernia repair  2013    x 3 hernias repaired  . Colonscopy      several with polys removed  . 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  Visit Diagnosis:  Swelling of limb, right  Decreased ROM of right knee  Right knee pain      Subjective Assessment - 09/01/15 1340    Subjective Pt states he has had difficulty since surgery with swelling in right knee. Pt states he has been using single point cane around a week.   Patient Stated Goals be able to bend knee again   Currently in Pain? Yes  with flexion    Pain Score 8    Pain Location Knee   Pain Orientation Right   Pain Descriptors / Indicators Tightness   Pain Type Surgical pain   Pain Onset 1 to 4 weeks ago   Pain Frequency Occasional   Aggravating Factors  knee flexion   Pain Relieving Factors rest, pain meds,  ice   Effect of Pain on Daily Activities difficulty with dressing, sleeping            [facility] PT Assessment - 09/01/15 0001    Assessment   Medical Diagnosis right TKR   Referring Provider Aluisio   Onset Date/Surgical Date 08/13/15   Prior Therapy 5 home PT visits   Precautions   Precautions None   Restrictions   Weight Bearing Restrictions Yes   Balance Screen   Has the patient fallen in the past 6 months No   Has the patient had a decrease in activity level because of a fear of falling?  Yes   Is the patient reluctant to leave their home because of a fear of falling?  No   Home Environment   Living Environment Private residence   Living Arrangements Spouse/significant other   Type of [place] to enter   Entrance Stairs-Number of Steps 3   Entrance Stairs-Rails Can reach both   [place] One level   [place] - single point;Grab bars - toilet   Prior Function   Level of [place] Retired   Leisure housework, yard work, gardening   ROM /  Strength   AROM / PROM / Strength AROM;PROM;Strength   AROM   Overall AROM Comments right knee extension 0, right knee flexion 82   PROM   Overall PROM Comments right knee flexion 91   Strength   Overall Strength Comments right hip flexion 4/5, right knee extension 5/5, right knee flexion 5/5, 4+/5   Palpation   Palpation comment swelling in right knee, 3+ pitting edema                   [facility] Adult PT Treatment/Exercise - 09/01/15 0001    Exercises   Exercises Knee/Hip   Knee/Hip Exercises: Aerobic   Nustep level 4 x 5 minutes   Knee/Hip Exercises: Machines for Strengthening   Cybex Knee Extension 5# 2 x 10   Cybex Knee Flexion 25# 2 x 10   Cybex Leg Press 20# 2 x 10   Modalities   Modalities Vasopneumatic   Vasopneumatic   Number Minutes Vasopneumatic  15 minutes   Vasopnuematic Location  Knee   Vasopneumatic Pressure Medium   Vasopneumatic  Temperature  36                PT Education - 09/01/15 1429    Education provided Yes   Education Details HEP seated knee flexion stretch, short arc quad, quad activation in supine   Person(s) Educated Patient   Methods Explanation;Demonstration;Handout   Comprehension Verbalized understanding;Returned demonstration          PT Short Term Goals - 09/01/15 1433    PT SHORT TERM GOAL #1   Title Pt will be independent with HEP   Time 1   Period Weeks   Status New   PT SHORT TERM GOAL #2   Title Pt will demonstrate understanding of RICE method   Time 1   Period Weeks   Status New           PT Long Term Goals - 09/01/15 1434    PT LONG TERM GOAL #1   Title Pt will demonstrate proper swing phase of gait with flexion of right knee and no AD   Time 4   Period Weeks   Status New   PT LONG TERM GOAL #2   Title Pt will increase active knee flexion of right knee to 100 degrees.   Time 4   Period Weeks   Status New   PT LONG TERM GOAL #3   Title Pt will display no pitting edema   Time 4   Period Weeks   Status New   PT LONG TERM GOAL #4   Title Pt will improve right hip flexion strength to 5/5   Time 4   Period Weeks   Status New               Plan - 09/01/15 1431    Clinical Impression Statement Pt has pain with passive flexion of right knee but was able to tolerate all exercises today.    Pt will benefit from skilled therapeutic intervention in order to improve on the following deficits Decreased activity tolerance;Decreased endurance;Decreased range of motion;Decreased strength;Pain;Increased edema   Rehab Potential Good   PT Frequency 3x / week   PT Duration 4 weeks   PT Treatment/Interventions ADLs/Self Care Home Management;Cryotherapy;Electrical Stimulation;Moist Heat;Therapeutic exercise;Therapeutic activities;Functional mobility training;Ultrasound;Patient/family education;Manual techniques;Vasopneumatic Device;Passive range of motion   PT Next  Visit Plan progress strengthening exercises, knee flexion stretches, vasopneumatic   PT Home Exercise Plan short arc quad, quad activation in  supine, seated knee flexion stretch   Consulted and Agree with Plan of Care Patient         Problem List Patient Active Problem List   Diagnosis Date Noted  . OA (osteoarthritis) of knee 08/13/2015  . Chronic bronchitis with acute exacerbation ([place]) 09/23/2013  . Atherosclerosis of native arteries of the extremities with intermittent claudication 06/18/2012  . Pain in limb 06/18/2012  . Claudication ([place]) 05/30/2012  . Frequency of urination 03/13/2012  . General medical examination 05/29/2011  . Myalgia 04/04/2011  . DERMATOPHYTOSIS OF NAIL 09/22/2010  . INGUINAL HERNIA, LEFT 09/22/2010  . DECREASED HEARING 12/23/2009  . RHINITIS 12/23/2009  . UNSPECIFIED DISORDER OF SKIN&SUBCUTANEOUS TISSUE 12/23/2009  . HYPERLIPIDEMIA 09/13/2009  . ASTHMA 08/29/2009  . LYMPHEDEMA 08/27/2009  . ELEVATED BLOOD PRESSURE WITHOUT DIAGNOSIS OF HYPERTENSION 08/27/2009  . BENIGN PROSTATIC HYPERTROPHY, MILD, HX OF 08/27/2009  . OTHER CHRONIC SINUSITIS 08/09/2007  . BRONCHITIS, CHRONIC 08/09/2007  . HIATAL HERNIA 08/09/2007  . COLITIS 08/09/2007    Barbette Merino, SPT 09/01/2015, 2:45 PM  [place], [place], [address] Phone: [phone]   Fax:  [phone]  Name: [name] MRN: [id number] Date of Birth: [birth year]

## 2015-09-03 ENCOUNTER — Ambulatory Visit: Payer: Medicare Other | Admitting: Physical Therapy

## 2015-09-03 ENCOUNTER — Encounter: Payer: Self-pay | Admitting: Physical Therapy

## 2015-09-03 DIAGNOSIS — M25561 Pain in right knee: Secondary | ICD-10-CM

## 2015-09-03 DIAGNOSIS — M7989 Other specified soft tissue disorders: Secondary | ICD-10-CM

## 2015-09-03 DIAGNOSIS — M25661 Stiffness of right knee, not elsewhere classified: Secondary | ICD-10-CM

## 2015-09-03 NOTE — Therapy (Signed)
[place] [name], [place], [address] Phone: [phone]   Fax:  [phone]  Physical Therapy Treatment  Patient Details  Name: [name] MRN: [id number] Date of Birth: 07-Apr-1941 Referring Provider: Wynelle Link  Encounter Date: 09/03/2015      PT End of Session - 09/03/15 0908    Visit Number 2   Date for PT Re-Evaluation 10/30/15   PT Start Time 0846   PT Stop Time 0935   PT Time Calculation (min) 49 min      Past Medical History  Diagnosis Date  . Ulcerative colitis   . Asthma   . Chronic bronchitis   . Sinusitis   . Peripheral edema   . Hearing loss in left ear   . COPD (chronic obstructive pulmonary disease) [facility])     Past Surgical History  Procedure Laterality Date  . Rotator cuff repair      bilateral shoulders   . Hernia repair  2013    x 3 hernias repaired  . Colonscopy      several with polys removed  . 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  Visit Diagnosis:  Swelling of limb, right  Decreased ROM of right knee  Right knee pain      Subjective Assessment - 09/03/15 0850    Subjective swollen and stiff   Currently in Pain? Yes   Pain Score 5    Pain Location Knee   Pain Orientation Right            [facility] PT Assessment - 09/03/15 0001    AROM   Overall AROM Comments Act flexion after MT 107                     [facility] Adult PT Treatment/Exercise - 09/03/15 0001    Knee/Hip Exercises: Aerobic   Stationary Bike 5 min  partial revolutions   Nustep level 4 x 6 minutes   Knee/Hip Exercises: Machines for Strengthening   Cybex Knee Extension 5# 2 x 10   Cybex Knee Flexion 25# 2 x 10   Cybex Leg Press 20# 2 x 10  calf raises 20# 2 sets 10   Modalities   Modalities  Vasopneumatic;Electrical Stimulation   Electrical Stimulation   Electrical Stimulation Location Rt knee   Electrical Stimulation Action IFC   Electrical Stimulation Goals Edema   Vasopneumatic   Number Minutes Vasopneumatic  --   Vasopnuematic Location  Knee   Vasopneumatic Pressure Medium   Manual Therapy   Manual Therapy Joint mobilization;Passive ROM   Passive ROM rt knee flex/ext                PT Education - 09/03/15 0900    Education provided Yes   Education Details ice and elevation above heart esp after activity   Person(s) Educated Patient   Methods Explanation;Demonstration   Comprehension Verbalized understanding          PT Short Term Goals - 09/03/15 0911    PT SHORT TERM GOAL #1   Title Pt will be independent with HEP   Baseline doing HHPT ther ex   Status Achieved   PT SHORT TERM GOAL #2   Title Pt will demonstrate understanding of RICE method   Status  Achieved           PT Long Term Goals - 09/01/15 1434    PT LONG TERM GOAL #1   Title Pt will demonstrate proper swing phase of gait with flexion of right knee and no AD   Time 4   Period Weeks   Status New   PT LONG TERM GOAL #2   Title Pt will increase active knee flexion of right knee to 100 degrees.   Time 4   Period Weeks   Status New   PT LONG TERM GOAL #3   Title Pt will display no pitting edema   Time 4   Period Weeks   Status New   PT LONG TERM GOAL #4   Title Pt will improve right hip flexion strength to 5/5   Time 4   Period Weeks   Status New   PT LONG TERM GOAL #5   Title ascend and descend stairs step over step   Time 4   Period Weeks   Status New               Plan - 09/03/15 0909    Clinical Impression Statement pt very swollen ,educated on elevation above heart with ice for swelling at home,trail of estim for swelling. Tolerated therapy and ther ex well,good ROM after MT.   PT Next Visit Plan progress strengthening exercises, knee flexion stretches,  vasopneumatic        Problem List Patient Active Problem List   Diagnosis Date Noted  . OA (osteoarthritis) of knee 08/13/2015  . Chronic bronchitis with acute exacerbation ([place]) 09/23/2013  . Atherosclerosis of native arteries of the extremities with intermittent claudication 06/18/2012  . Pain in limb 06/18/2012  . Claudication ([place]) 05/30/2012  . Frequency of urination 03/13/2012  . General medical examination 05/29/2011  . Myalgia 04/04/2011  . DERMATOPHYTOSIS OF NAIL 09/22/2010  . INGUINAL HERNIA, LEFT 09/22/2010  . DECREASED HEARING 12/23/2009  . RHINITIS 12/23/2009  . UNSPECIFIED DISORDER OF SKIN&SUBCUTANEOUS TISSUE 12/23/2009  . HYPERLIPIDEMIA 09/13/2009  . ASTHMA 08/29/2009  . LYMPHEDEMA 08/27/2009  . ELEVATED BLOOD PRESSURE WITHOUT DIAGNOSIS OF HYPERTENSION 08/27/2009  . BENIGN PROSTATIC HYPERTROPHY, MILD, HX OF 08/27/2009  . OTHER CHRONIC SINUSITIS 08/09/2007  . BRONCHITIS, CHRONIC 08/09/2007  . HIATAL HERNIA 08/09/2007  . COLITIS 08/09/2007    PAYSEUR,ANGIE PTA 09/03/2015, 9:19 AM  [place] [address] [place], [place], [address] Phone: [phone]   Fax:  [phone]  Name: [name] MRN: [id number] Date of Birth: 1941/01/15

## 2015-09-06 ENCOUNTER — Ambulatory Visit: Payer: Medicare Other | Admitting: Physical Therapy

## 2015-09-06 ENCOUNTER — Encounter: Payer: Self-pay | Admitting: Physical Therapy

## 2015-09-06 DIAGNOSIS — M7989 Other specified soft tissue disorders: Secondary | ICD-10-CM | POA: Diagnosis not present

## 2015-09-06 DIAGNOSIS — M25661 Stiffness of right knee, not elsewhere classified: Secondary | ICD-10-CM

## 2015-09-06 DIAGNOSIS — M25561 Pain in right knee: Secondary | ICD-10-CM

## 2015-09-06 NOTE — Therapy (Signed)
[place], [place], [address] Phone: [phone]   Fax:  [phone]  Physical Therapy Treatment  Patient Details  Name: [name] MRN: [id number] Date of Birth: 01-Oct-1940 Referring Provider: Wynelle Link  Encounter Date: 09/06/2015      PT End of Session - 09/06/15 1223    Visit Number 3   Date for PT Re-Evaluation 10/30/15   PT Start Time 1962   PT Stop Time 1238   PT Time Calculation (min) 54 min   Activity Tolerance Patient tolerated treatment well   Behavior During Therapy [facility] for tasks assessed/performed      Past Medical History  Diagnosis Date  . Ulcerative colitis   . Asthma   . Chronic bronchitis   . Sinusitis   . Peripheral edema   . Hearing loss in left ear   . COPD (chronic obstructive pulmonary disease) [facility])     Past Surgical History  Procedure Laterality Date  . Rotator cuff repair      bilateral shoulders   . Hernia repair  2013    x 3 hernias repaired  . Colonscopy      several with polys removed  . 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  Visit Diagnosis:  Right knee pain  Decreased ROM of right knee  Swelling of limb, right      Subjective Assessment - 09/06/15 1143    Subjective Pt reports that his knee stiffens up when he is stationary.   Currently in Pain? No/denies   Pain Score 0-No pain                         [facility] Adult PT Treatment/Exercise - 09/06/15 0001    Knee/Hip Exercises: Aerobic   Stationary Bike 5 min  partial revolutions; seat 8, Some full revolutions  rev   Nustep level 4 x 6 minutes  LE only    Knee/Hip Exercises: Machines for Strengthening   Cybex Knee Extension 5# 2 x 15   Cybex Knee Flexion 25# 2 x 15   Cybex Leg Press  20# 2 x 15; Heel raises #20 x30   Knee/Hip Exercises: Standing   Forward Step Up 2 sets;Hand Hold: 0;Step Height: 6";10 reps;Right   Modalities   Modalities Vasopneumatic;Electrical Sales promotion account executive Location Rt knee   Electrical Stimulation Action IFC   Electrical Stimulation Goals Pain   Vasopneumatic   Number Minutes Vasopneumatic  15 minutes   Vasopnuematic Location  Knee   Vasopneumatic Pressure High   Vasopneumatic Temperature  34   Manual Therapy   Manual Therapy Passive ROM   Manual therapy comments PROM taken to end range and held several times.    Passive ROM rt knee flex/ext                  PT Short Term Goals - 09/03/15 0911    PT SHORT TERM GOAL #1   Title Pt will be independent with HEP   Baseline doing HHPT ther ex   Status Achieved   PT SHORT TERM GOAL #2   Title Pt will demonstrate understanding of RICE method   Status Achieved  PT Long Term Goals - 09/01/15 1434    PT LONG TERM GOAL #1   Title Pt will demonstrate proper swing phase of gait with flexion of right knee and no AD   Time 4   Period Weeks   Status New   PT LONG TERM GOAL #2   Title Pt will increase active knee flexion of right knee to 100 degrees.   Time 4   Period Weeks   Status New   PT LONG TERM GOAL #3   Title Pt will display no pitting edema   Time 4   Period Weeks   Status New   PT LONG TERM GOAL #4   Title Pt will improve right hip flexion strength to 5/5   Time 4   Period Weeks   Status New   PT LONG TERM GOAL #5   Title ascend and descend stairs step over step   Time 4   Period Weeks   Status New               Plan - 09/06/15 1223    Clinical Impression Statement Pt R knee continues to be swollen, Tolerated therapy exercises well with increase repetitions. PROM and AROM has improved.    Pt will benefit from skilled therapeutic intervention in order to improve on the following deficits Decreased  activity tolerance;Decreased endurance;Decreased range of motion;Decreased strength;Pain;Increased edema   Rehab Potential Good   PT Frequency 3x / week   PT Duration 4 weeks   PT Treatment/Interventions ADLs/Self Care Home Management;Cryotherapy;Electrical Stimulation;Moist Heat;Therapeutic exercise;Therapeutic activities;Functional mobility training;Ultrasound;Patient/family education;Manual techniques;Vasopneumatic Device;Passive range of motion   PT Next Visit Plan progress strengthening exercises, knee flexion stretches, vasopneumatic        Problem List Patient Active Problem List   Diagnosis Date Noted  . OA (osteoarthritis) of knee 08/13/2015  . Chronic bronchitis with acute exacerbation ([place]) 09/23/2013  . Atherosclerosis of native arteries of the extremities with intermittent claudication 06/18/2012  . Pain in limb 06/18/2012  . Claudication ([place]) 05/30/2012  . Frequency of urination 03/13/2012  . General medical examination 05/29/2011  . Myalgia 04/04/2011  . DERMATOPHYTOSIS OF NAIL 09/22/2010  . INGUINAL HERNIA, LEFT 09/22/2010  . DECREASED HEARING 12/23/2009  . RHINITIS 12/23/2009  . UNSPECIFIED DISORDER OF SKIN&SUBCUTANEOUS TISSUE 12/23/2009  . HYPERLIPIDEMIA 09/13/2009  . ASTHMA 08/29/2009  . LYMPHEDEMA 08/27/2009  . ELEVATED BLOOD PRESSURE WITHOUT DIAGNOSIS OF HYPERTENSION 08/27/2009  . BENIGN PROSTATIC HYPERTROPHY, MILD, HX OF 08/27/2009  . OTHER CHRONIC SINUSITIS 08/09/2007  . BRONCHITIS, CHRONIC 08/09/2007  . HIATAL HERNIA 08/09/2007  . COLITIS 08/09/2007    Scot Jun, PTA  09/06/2015, 12:26 PM  [place] [address] [place], [place], [address] Phone: [phone]   Fax:  [phone]  Name: [name] MRN: [id number] Date of Birth: 09-20-1940

## 2015-09-08 ENCOUNTER — Encounter: Payer: Self-pay | Admitting: Physical Therapy

## 2015-09-08 ENCOUNTER — Ambulatory Visit: Payer: Medicare Other | Attending: Orthopedic Surgery | Admitting: Physical Therapy

## 2015-09-08 DIAGNOSIS — R29898 Other symptoms and signs involving the musculoskeletal system: Secondary | ICD-10-CM | POA: Insufficient documentation

## 2015-09-08 DIAGNOSIS — M25661 Stiffness of right knee, not elsewhere classified: Secondary | ICD-10-CM

## 2015-09-08 DIAGNOSIS — M25561 Pain in right knee: Secondary | ICD-10-CM | POA: Diagnosis present

## 2015-09-08 DIAGNOSIS — M7989 Other specified soft tissue disorders: Secondary | ICD-10-CM | POA: Insufficient documentation

## 2015-09-08 NOTE — Therapy (Signed)
[place], [place], [address] Phone: [phone]   Fax:  [phone]  Physical Therapy Treatment  Patient Details  Name: [name] MRN: [id number] Date of Birth: 11/19/1940 Referring Provider: Wynelle Link  Encounter Date: 09/08/2015      PT End of Session - 09/08/15 1055    Visit Number 4   Date for PT Re-Evaluation 10/30/15   PT Start Time 1013   PT Stop Time 1109   PT Time Calculation (min) 56 min   Activity Tolerance Patient tolerated treatment well   Behavior During Therapy [facility] for tasks assessed/performed      Past Medical History  Diagnosis Date  . Ulcerative colitis   . Asthma   . Chronic bronchitis   . Sinusitis   . Peripheral edema   . Hearing loss in left ear   . COPD (chronic obstructive pulmonary disease) [facility])     Past Surgical History  Procedure Laterality Date  . Rotator cuff repair      bilateral shoulders   . Hernia repair  2013    x 3 hernias repaired  . Colonscopy      several with polys removed  . 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  Visit Diagnosis:  Swelling of limb, right  Decreased ROM of right knee  Right knee pain      Subjective Assessment - 09/08/15 1013    Subjective "It swelled up pretty good yesterday for some reason"   Currently in Pain? No/denies                         [facility] Adult PT Treatment/Exercise - 09/08/15 0001    Ambulation/Gait   Gait Comments Negotiated 2 flights of stairs no rail alternating pattern    High Level Balance   High Level Balance Comments standing march on airex 3x10   Knee/Hip Exercises: Aerobic   Stationary Bike 5 min  Seat level 8 full revolutions.    Nustep level 4 x 6 minutes   Knee/Hip Exercises:  Machines for Strengthening   Cybex Knee Extension 5# 2 x 15   Cybex Knee Flexion 25# 2 x 15   Cybex Leg Press 30# 2 x 15; Heel raises #30 x30   Knee/Hip Exercises: Standing   Hip Abduction 2 sets;10 reps;Knee straight   Abduction Limitations 3   Hip Extension 2 sets;Both;10 reps;Knee straight   Extension Limitations 3   Forward Step Up 2 sets;Step Height: 6";10 reps;Right;Hand Hold: 1   Forward Step Up Limitations #3                   PT Short Term Goals - 09/03/15 0911    PT SHORT TERM GOAL #1   Title Pt will be independent with HEP   Baseline doing HHPT ther ex   Status Achieved   PT SHORT TERM GOAL #2   Title Pt will demonstrate understanding of RICE method   Status Achieved           PT Long Term Goals - 09/08/15 1058    PT LONG TERM GOAL #1   Title Pt will demonstrate proper swing phase of gait with flexion of right knee and no AD   Status Partially  Met               Plan - 09/08/15 1056    Clinical Impression Statement Pt tolerated treatment well, does fatigue with exercise. Able to progress to stair negotiation maintaining alternating pattern without rail.   Pt will benefit from skilled therapeutic intervention in order to improve on the following deficits Decreased activity tolerance;Decreased endurance;Decreased range of motion;Decreased strength;Pain;Increased edema   Rehab Potential Good   PT Frequency 3x / week   PT Duration 4 weeks   PT Treatment/Interventions ADLs/Self Care Home Management;Cryotherapy;Electrical Stimulation;Moist Heat;Therapeutic exercise;Therapeutic activities;Functional mobility training;Ultrasound;Patient/family education;Manual techniques;Vasopneumatic Device;Passive range of motion   PT Next Visit Plan progress strengthening exercises, knee flexion stretches, vasopneumatic        Problem List Patient Active Problem List   Diagnosis Date Noted  . OA (osteoarthritis) of knee 08/13/2015  . Chronic bronchitis with  acute exacerbation ([place]) 09/23/2013  . Atherosclerosis of native arteries of the extremities with intermittent claudication 06/18/2012  . Pain in limb 06/18/2012  . Claudication ([place]) 05/30/2012  . Frequency of urination 03/13/2012  . General medical examination 05/29/2011  . Myalgia 04/04/2011  . DERMATOPHYTOSIS OF NAIL 09/22/2010  . INGUINAL HERNIA, LEFT 09/22/2010  . DECREASED HEARING 12/23/2009  . RHINITIS 12/23/2009  . UNSPECIFIED DISORDER OF SKIN&SUBCUTANEOUS TISSUE 12/23/2009  . HYPERLIPIDEMIA 09/13/2009  . ASTHMA 08/29/2009  . LYMPHEDEMA 08/27/2009  . ELEVATED BLOOD PRESSURE WITHOUT DIAGNOSIS OF HYPERTENSION 08/27/2009  . BENIGN PROSTATIC HYPERTROPHY, MILD, HX OF 08/27/2009  . OTHER CHRONIC SINUSITIS 08/09/2007  . BRONCHITIS, CHRONIC 08/09/2007  . HIATAL HERNIA 08/09/2007  . COLITIS 08/09/2007    Scot Jun, PTA  09/08/2015, 10:58 AM  [place], [place], [address] Phone: [phone]   Fax:  [phone]  Name: [name] MRN: [id number] Date of Birth: 02/03/1941

## 2015-09-10 ENCOUNTER — Other Ambulatory Visit: Payer: Self-pay | Admitting: Family Medicine

## 2015-09-10 ENCOUNTER — Ambulatory Visit: Payer: Medicare Other | Admitting: Physical Therapy

## 2015-09-10 ENCOUNTER — Encounter: Payer: Self-pay | Admitting: Physical Therapy

## 2015-09-10 DIAGNOSIS — Z7952 Long term (current) use of systemic steroids: Secondary | ICD-10-CM

## 2015-09-10 DIAGNOSIS — M7989 Other specified soft tissue disorders: Secondary | ICD-10-CM | POA: Diagnosis not present

## 2015-09-10 DIAGNOSIS — M25661 Stiffness of right knee, not elsewhere classified: Secondary | ICD-10-CM

## 2015-09-10 DIAGNOSIS — M25561 Pain in right knee: Secondary | ICD-10-CM

## 2015-09-10 NOTE — Therapy (Signed)
[place], [place], [address] Phone: [phone]   Fax:  [phone]  Physical Therapy Treatment  Patient Details  Name: [name] MRN: [id number] Date of Birth: 06-12-1941 Referring Provider: Wynelle Link  Encounter Date: 09/10/2015      PT End of Session - 09/10/15 1056    Visit Number 5   Date for PT Re-Evaluation 10/30/15   PT Start Time 8938   PT Stop Time 1110   PT Time Calculation (min) 55 min   Activity Tolerance Patient tolerated treatment well   Behavior During Therapy [facility] for tasks assessed/performed      Past Medical History  Diagnosis Date  . Ulcerative colitis   . Asthma   . Chronic bronchitis   . Sinusitis   . Peripheral edema   . Hearing loss in left ear   . COPD (chronic obstructive pulmonary disease) [facility])     Past Surgical History  Procedure Laterality Date  . Rotator cuff repair      bilateral shoulders   . Hernia repair  2013    x 3 hernias repaired  . Colonscopy      several with polys removed  . 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  Visit Diagnosis:  Swelling of limb, right  Decreased ROM of right knee  Right knee pain      Subjective Assessment - 09/10/15 1015    Subjective Pt states he has been doing well since last visit. No new problems.   Currently in Pain? No/denies            [facility] PT Assessment - 09/10/15 0001    AROM   Overall AROM Comments Right knee AROM 7-107   PROM   Overall PROM Comments Right knee PROM 4-115                     [facility] Adult PT Treatment/Exercise - 09/10/15 0001    Knee/Hip Exercises: Aerobic   Stationary Bike 6 min  seat level 8   Nustep level 6 x 5 minutes   Knee/Hip Exercises: Machines for Strengthening   Cybex Knee Extension 5# 2 x 15   Cybex Knee Flexion 25# 2 x 15   Cybex Leg Press 30# 2 x 15; Heel raises #30 x30   Knee/Hip Exercises: Standing   Hip Abduction 2 sets;10 reps;Knee straight;Both   Abduction Limitations 3#   Hip Extension 2 sets;Both;10 reps;Knee straight   Extension Limitations #3   Forward Step Up 2 sets;Step Height: 6";10 reps;Right   Forward Step Up Limitations #3   Modalities   Modalities Vasopneumatic;Electrical Stimulation   Electrical Stimulation   Electrical Stimulation Location right knee   Electrical Stimulation Action IFC   Electrical Stimulation Parameters tolerance   Electrical Stimulation Goals Pain   Vasopneumatic   Number Minutes Vasopneumatic  15 minutes   Vasopnuematic Location  Knee   Vasopneumatic Pressure High   Vasopneumatic Temperature  34                  PT Short Term Goals - 09/03/15 0911    PT SHORT TERM GOAL #1   Title Pt will be independent with HEP   Baseline doing HHPT ther ex   Status Achieved  PT SHORT TERM GOAL #2   Title Pt will demonstrate understanding of RICE method   Status Achieved           PT Long Term Goals - 09/10/15 1058    PT LONG TERM GOAL #2   Title Pt will increase active knee flexion of right knee to 100 degrees.   Time 4   Period Weeks   Status Achieved               Plan - 09/10/15 1057    Clinical Impression Statement Pt tolerated all exercises well today and has had an increase in ROM.   Pt will benefit from skilled therapeutic intervention in order to improve on the following deficits Decreased activity tolerance;Decreased endurance;Decreased range of motion;Decreased strength;Pain;Increased edema   Rehab Potential Good   PT Frequency 3x / week   PT Duration 4 weeks   PT Treatment/Interventions ADLs/Self Care Home Management;Cryotherapy;Electrical Stimulation;Moist Heat;Therapeutic exercise;Therapeutic activities;Functional mobility training;Ultrasound;Patient/family  education;Manual techniques;Vasopneumatic Device;Passive range of motion   PT Next Visit Plan progress strengthening exercises, knee flexion stretches, stair negotiation   Consulted and Agree with Plan of Care Patient        Problem List Patient Active Problem List   Diagnosis Date Noted  . OA (osteoarthritis) of knee 08/13/2015  . Chronic bronchitis with acute exacerbation ([place]) 09/23/2013  . Atherosclerosis of native arteries of the extremities with intermittent claudication 06/18/2012  . Pain in limb 06/18/2012  . Claudication ([place]) 05/30/2012  . Frequency of urination 03/13/2012  . General medical examination 05/29/2011  . Myalgia 04/04/2011  . DERMATOPHYTOSIS OF NAIL 09/22/2010  . INGUINAL HERNIA, LEFT 09/22/2010  . DECREASED HEARING 12/23/2009  . RHINITIS 12/23/2009  . UNSPECIFIED DISORDER OF SKIN&SUBCUTANEOUS TISSUE 12/23/2009  . HYPERLIPIDEMIA 09/13/2009  . ASTHMA 08/29/2009  . LYMPHEDEMA 08/27/2009  . ELEVATED BLOOD PRESSURE WITHOUT DIAGNOSIS OF HYPERTENSION 08/27/2009  . BENIGN PROSTATIC HYPERTROPHY, MILD, HX OF 08/27/2009  . OTHER CHRONIC SINUSITIS 08/09/2007  . BRONCHITIS, CHRONIC 08/09/2007  . HIATAL HERNIA 08/09/2007  . COLITIS 08/09/2007    Barbette Merino, SPT 09/10/2015, 11:00 AM  [place], [place], [address] Phone: [phone]   Fax:  [phone]  Name: [name] MRN: [id number] Date of Birth: 03-07-1941

## 2015-09-13 ENCOUNTER — Ambulatory Visit: Payer: Medicare Other | Admitting: Physical Therapy

## 2015-09-13 ENCOUNTER — Encounter: Payer: Self-pay | Admitting: Physical Therapy

## 2015-09-13 DIAGNOSIS — M25661 Stiffness of right knee, not elsewhere classified: Secondary | ICD-10-CM

## 2015-09-13 DIAGNOSIS — M7989 Other specified soft tissue disorders: Secondary | ICD-10-CM | POA: Diagnosis not present

## 2015-09-13 DIAGNOSIS — M25561 Pain in right knee: Secondary | ICD-10-CM

## 2015-09-13 NOTE — Therapy (Signed)
[place], [place], [address] Phone: [phone]   Fax:  [phone]  Physical Therapy Treatment  Patient Details  Name: [name] MRN: [id number] Date of Birth: 09/29/1940 Referring Provider: Wynelle Link  Encounter Date: 09/13/2015      PT End of Session - 09/13/15 1054    Visit Number 6   Date for PT Re-Evaluation 10/30/15   PT Start Time 1014   PT Stop Time 1109   PT Time Calculation (min) 55 min   Activity Tolerance Patient tolerated treatment well   Behavior During Therapy [facility] for tasks assessed/performed      Past Medical History  Diagnosis Date  . Ulcerative colitis   . Asthma   . Chronic bronchitis   . Sinusitis   . Peripheral edema   . Hearing loss in left ear   . COPD (chronic obstructive pulmonary disease) [facility])     Past Surgical History  Procedure Laterality Date  . Rotator cuff repair      bilateral shoulders   . Hernia repair  2013    x 3 hernias repaired  . Colonscopy      several with polys removed  . 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  Visit Diagnosis:  Right knee pain  Decreased ROM of right knee  Swelling of limb, right      Subjective Assessment - 09/13/15 1013    Subjective No problems    Currently in Pain? No/denies   Pain Score 0-No pain            [facility] PT Assessment - 09/13/15 0001    AROM   Overall AROM Comments Right knee AROM 5-113                     [facility] Adult PT Treatment/Exercise - 09/13/15 0001    Knee/Hip Exercises: Aerobic   Stationary Bike 6 min  full revolutions with little hip hike Seat level 7   Nustep level 6 x 5 minutes   Knee/Hip Exercises: Machines for Strengthening   Cybex Knee Extension 5# 3 x 10  RLE    Cybex  Knee Flexion 15# 3 x 10  RLE    Cybex Leg Press 40# 2 x 15; Heel raises #40 x30   Knee/Hip Exercises: Standing   Lateral Step Up 2 sets;10 reps;Step Height: 6";Hand Hold: 0;Both   Lateral Step Up Limitations #3   Forward Step Up Right;Step Height: 8";15 reps;1 set;Both   Forward Step Up Limitations #3   Modalities   Modalities Vasopneumatic;Electrical Stimulation   Vasopneumatic   Number Minutes Vasopneumatic  15 minutes   Vasopnuematic Location  Knee   Vasopneumatic Pressure High   Vasopneumatic Temperature  34   Manual Therapy   Manual Therapy Passive ROM   Manual therapy comments PROM taken to end range and held several times.    Passive ROM rt knee flex/ext                  PT Short Term Goals - 09/03/15 0911    PT SHORT TERM GOAL #1   Title Pt will be independent with HEP   Baseline doing HHPT ther ex   Status Achieved   PT SHORT TERM GOAL #2  Title Pt will demonstrate understanding of RICE method   Status Achieved           PT Long Term Goals - 09/13/15 1056    PT LONG TERM GOAL #3   Title Pt will display no pitting edema   Status On-going   PT LONG TERM GOAL #5   Title ascend and descend stairs step over step   Status Partially Met               Plan - 09/13/15 1054    Clinical Impression Statement Tolerated functional interventions with resistance well. Does fatigue with exercise. Pt has had a mild increase in R knee AROM.   Pt will benefit from skilled therapeutic intervention in order to improve on the following deficits Decreased activity tolerance;Decreased endurance;Decreased range of motion;Decreased strength;Pain;Increased edema   Rehab Potential Good   PT Frequency 3x / week   PT Duration 4 weeks   PT Treatment/Interventions ADLs/Self Care Home Management;Cryotherapy;Electrical Stimulation;Moist Heat;Therapeutic exercise;Therapeutic activities;Functional mobility training;Ultrasound;Patient/family education;Manual  techniques;Vasopneumatic Device;Passive range of motion   PT Next Visit Plan Send note to MD         Problem List Patient Active Problem List   Diagnosis Date Noted  . OA (osteoarthritis) of knee 08/13/2015  . Chronic bronchitis with acute exacerbation (HCC) 09/23/2013  . Atherosclerosis of native arteries of the extremities with intermittent claudication 06/18/2012  . Pain in limb 06/18/2012  . Claudication (HCC) 05/30/2012  . Frequency of urination 03/13/2012  . General medical examination 05/29/2011  . Myalgia 04/04/2011  . DERMATOPHYTOSIS OF NAIL 09/22/2010  . INGUINAL HERNIA, LEFT 09/22/2010  . DECREASED HEARING 12/23/2009  . RHINITIS 12/23/2009  . UNSPECIFIED DISORDER OF SKIN&SUBCUTANEOUS TISSUE 12/23/2009  . HYPERLIPIDEMIA 09/13/2009  . ASTHMA 08/29/2009  . LYMPHEDEMA 08/27/2009  . ELEVATED BLOOD PRESSURE WITHOUT DIAGNOSIS OF HYPERTENSION 08/27/2009  . BENIGN PROSTATIC HYPERTROPHY, MILD, HX OF 08/27/2009  . OTHER CHRONIC SINUSITIS 08/09/2007  . BRONCHITIS, CHRONIC 08/09/2007  . HIATAL HERNIA 08/09/2007  . COLITIS 08/09/2007     G , PTA  09/13/2015, 10:57 AM  [facility] [address] [place], [place], [address] Phone: [phone]   Fax:  [phone]  Name: [name] MRN: [id number] Date of Birth: 03/25/1941     

## 2015-09-16 ENCOUNTER — Encounter: Payer: Self-pay | Admitting: Physical Therapy

## 2015-09-16 ENCOUNTER — Ambulatory Visit: Payer: Medicare Other | Admitting: Physical Therapy

## 2015-09-16 ENCOUNTER — Telehealth: Payer: Self-pay

## 2015-09-16 DIAGNOSIS — M7989 Other specified soft tissue disorders: Secondary | ICD-10-CM

## 2015-09-16 DIAGNOSIS — M25561 Pain in right knee: Secondary | ICD-10-CM

## 2015-09-16 DIAGNOSIS — M25661 Stiffness of right knee, not elsewhere classified: Secondary | ICD-10-CM

## 2015-09-16 NOTE — Therapy (Addendum)
[place], [place], [address] Phone: [phone]   Fax:  [phone]  Physical Therapy Treatment  Patient Details  Name: [name] MRN: [id number] Date of Birth: Dec 23, 1940 Referring Provider: Wynelle Link  Encounter Date: 09/16/2015      PT End of Session - 09/16/15 1055    Visit Number 7   Date for PT Re-Evaluation 10/30/15   PT Start Time 1013   PT Stop Time 1108   PT Time Calculation (min) 55 min   Activity Tolerance Patient tolerated treatment well   Behavior During Therapy [facility] for tasks assessed/performed      Past Medical History  Diagnosis Date  . Ulcerative colitis   . Asthma   . Chronic bronchitis   . Sinusitis   . Peripheral edema   . Hearing loss in left ear   . COPD (chronic obstructive pulmonary disease) [facility])     Past Surgical History  Procedure Laterality Date  . Rotator cuff repair      bilateral shoulders   . Hernia repair  2013    x 3 hernias repaired  . Colonscopy      several with polys removed  . 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  Visit Diagnosis:  Decreased ROM of right knee  Right knee pain  Swelling of limb, right      Subjective Assessment - 09/16/15 1013    Subjective No new problems or complaints of pain. Pt states he has been doing well since last session.   Currently in Pain? No/denies            [facility] PT Assessment - 09/16/15 0001    AROM   Overall AROM Comments right knee AROM 3-114                     [facility] Adult PT Treatment/Exercise - 09/16/15 0001    Knee/Hip Exercises: Aerobic   Stationary Bike 6 min, level 1  Seat level 6   Nustep level 6 x 5 minutes   Knee/Hip Exercises: Machines for Strengthening   Cybex Knee Extension  5# 2x15  RLE   Cybex Knee Flexion 20# 2x15  RLE   Cybex Leg Press 40# 2 x 15; Heel raises #40 x30   Knee/Hip Exercises: Standing   Hip Abduction 2 sets;10 reps;Knee straight;Both   Abduction Limitations 3#   Hip Extension 2 sets;Both;10 reps;Knee straight   Extension Limitations 3#   Lateral Step Up 2 sets;10 reps;Step Height: 6"   Forward Step Up Both;10 reps;Step Height: 6"   Modalities   Modalities Vasopneumatic;Electrical Stimulation   Electrical Stimulation   Electrical Stimulation Location right knee   Electrical Stimulation Action IFC   Electrical Stimulation Parameters tolerance   Electrical Stimulation Goals Pain   Vasopneumatic   Number Minutes Vasopneumatic  15 minutes   Vasopnuematic Location  Knee   Vasopneumatic Pressure High   Vasopneumatic Temperature  34   Manual Therapy   Manual Therapy Passive ROM   Manual therapy comments PROM takne to ende range and held several times.    Passive ROM rt knee flex/ext                  PT Short Term Goals - 09/03/15 1027  PT SHORT TERM GOAL #1   Title Pt will be independent with HEP   Baseline doing HHPT ther ex   Status Achieved   PT SHORT TERM GOAL #2   Title Pt will demonstrate understanding of RICE method   Status Achieved           PT Long Term Goals - 09/13/15 1056    PT LONG TERM GOAL #3   Title Pt will display no pitting edema   Status On-going   PT LONG TERM GOAL #5   Title ascend and descend stairs step over step   Status Partially Met               Plan - 09/16/15 1056    Clinical Impression Statement Pt tolerated exercises well today. Has some fatigue with exercises and increase in right knee AROM   Pt will benefit from skilled therapeutic intervention in order to improve on the following deficits Decreased activity tolerance;Decreased endurance;Decreased range of motion;Decreased strength;Pain;Increased edema   Rehab Potential Good   PT Frequency 3x / week   PT Duration 4  weeks   PT Treatment/Interventions ADLs/Self Care Home Management;Cryotherapy;Electrical Stimulation;Moist Heat;Therapeutic exercise;Therapeutic activities;Functional mobility training;Ultrasound;Patient/family education;Manual techniques;Vasopneumatic Device;Passive range of motion   PT Next Visit Plan progress exercises as tolerated   Consulted and Agree with Plan of Care Patient    PHYSICAL THERAPY DISCHARGE SUMMARY  Patient saw the MD yesterday and he was very pleased and wanted [place] to D/C PT. Plan: Patient agrees to discharge.  Patient goals were met. Patient is being discharged due to meeting the stated rehab goals.  ?????         Problem List Patient Active Problem List   Diagnosis Date Noted  . OA (osteoarthritis) of knee 08/13/2015  . Chronic bronchitis with acute exacerbation ([place]) 09/23/2013  . Atherosclerosis of native arteries of the extremities with intermittent claudication 06/18/2012  . Pain in limb 06/18/2012  . Claudication ([place]) 05/30/2012  . Frequency of urination 03/13/2012  . General medical examination 05/29/2011  . Myalgia 04/04/2011  . DERMATOPHYTOSIS OF NAIL 09/22/2010  . INGUINAL HERNIA, LEFT 09/22/2010  . DECREASED HEARING 12/23/2009  . RHINITIS 12/23/2009  . UNSPECIFIED DISORDER OF SKIN&SUBCUTANEOUS TISSUE 12/23/2009  . HYPERLIPIDEMIA 09/13/2009  . ASTHMA 08/29/2009  . LYMPHEDEMA 08/27/2009  . ELEVATED BLOOD PRESSURE WITHOUT DIAGNOSIS OF HYPERTENSION 08/27/2009  . BENIGN PROSTATIC HYPERTROPHY, MILD, HX OF 08/27/2009  . OTHER CHRONIC SINUSITIS 08/09/2007  . BRONCHITIS, CHRONIC 08/09/2007  . HIATAL HERNIA 08/09/2007  . COLITIS 08/09/2007   [name], PT Sumner Boast, SPT 09/16/2015, 11:37 AM  [place] [address] [place], [place], [address] Phone: [phone]   Fax:  [phone]  Name: [name] MRN: [id number] Date of Birth: [birth year]

## 2015-09-16 NOTE — Telephone Encounter (Signed)
09/16/15 patient called to say MD okays d/c PT

## 2015-09-20 ENCOUNTER — Ambulatory Visit: Payer: Medicare Other | Admitting: Physical Therapy

## 2015-09-23 ENCOUNTER — Ambulatory Visit: Payer: Medicare Other | Admitting: Physical Therapy

## 2015-09-27 ENCOUNTER — Ambulatory Visit: Payer: Medicare Other | Admitting: Physical Therapy

## 2015-09-30 ENCOUNTER — Ambulatory Visit: Payer: Medicare Other | Admitting: Physical Therapy

## 2015-10-01 ENCOUNTER — Ambulatory Visit
Admission: RE | Admit: 2015-10-01 | Discharge: 2015-10-01 | Disposition: A | Discharge status: Home / Self Care | Payer: Medicare Other | Source: Ambulatory Visit | Attending: Family Medicine | Admitting: Family Medicine

## 2015-10-01 DIAGNOSIS — Z7952 Long term (current) use of systemic steroids: Secondary | ICD-10-CM

## 2015-10-04 ENCOUNTER — Ambulatory Visit: Payer: Medicare Other | Admitting: Physical Therapy

## 2015-10-21 ENCOUNTER — Other Ambulatory Visit: Payer: Self-pay | Admitting: *Deleted

## 2015-10-21 MED ORDER — MONTELUKAST SODIUM 10 MG PO TABS: 10.0000 mg | ORAL_TABLET | Freq: Every evening | ORAL | Status: DC

## 2015-10-26 ENCOUNTER — Ambulatory Visit (INDEPENDENT_AMBULATORY_CARE_PROVIDER_SITE_OTHER)
Admission: RE | Admit: 2015-10-26 | Discharge: 2015-10-26 | Disposition: A | Discharge status: Home / Self Care | Payer: Medicare Other | Source: Ambulatory Visit | Attending: Internal Medicine | Admitting: Internal Medicine

## 2015-10-26 ENCOUNTER — Ambulatory Visit (INDEPENDENT_AMBULATORY_CARE_PROVIDER_SITE_OTHER): Payer: Medicare Other | Admitting: Internal Medicine

## 2015-10-26 ENCOUNTER — Encounter: Payer: Self-pay | Admitting: Internal Medicine

## 2015-10-26 VITALS — BP 98/62 | HR 72 | Ht 70.0 in | Wt 170.0 lb

## 2015-10-26 DIAGNOSIS — K51919 Ulcerative colitis, unspecified with unspecified complications: Secondary | ICD-10-CM | POA: Diagnosis not present

## 2015-10-26 DIAGNOSIS — J449 Chronic obstructive pulmonary disease, unspecified: Secondary | ICD-10-CM

## 2015-10-26 DIAGNOSIS — J42 Unspecified chronic bronchitis: Secondary | ICD-10-CM | POA: Diagnosis not present

## 2015-10-26 DIAGNOSIS — J45909 Unspecified asthma, uncomplicated: Secondary | ICD-10-CM

## 2015-10-26 NOTE — Progress Notes (Signed)
Patient ID: [name], male    DOB: Dec 18, 1940, 75 y.o.   MRN: [id number]  HPI 03/08/11- 75 year old male former smoker followed for chronic asthmatic bronchitis and rhinitis with history of chronic sinusitis and colitis. Last here August 16, 2010- CXR 08/16/10- NAD.  CXR 10/17/10 -preop for hernia repair- NAD, mild thoracic spine DGD. Now for past 2-3 weeks has had increased nasal congestion, maybe a little inner ear, light headedness on standing, and increased chest congestion with yellow sputum, no fever.  09/08/11- 75 year old male former smoker followed for chronic asthmatic bronchitis and rhinitis with history of chronic sinusitis and colitis. He did better with Cefdinir after last visit but never really cleared. When lying supine he notices nasal congestion with postnasal drip and cough productive of discolored sputum. Mild shortness of breath with exertion. Denies fever, nodes, headache, chest pain, palpitation.  10/06/11- 75 year old male former smoker followed for chronic asthmatic bronchitis and rhinitis with history of chronic sinusitis and colitis. He feels that he is back to baseline now. Some cough with occasional clear mucus but much better. Nasonex helps his nasal congestion and drainage.  04/22/12- 75 year old male former smoker followed for chronic asthmatic bronchitis and rhinitis with history of chronic sinusitis and colitis. Denies any new flare ups of wheezing or SOB; breathing about the same as last visit. Had a good summer despite the rain. Could not afford Daliresp.  04/28/13- 75 year old male former smoker followed for chronic asthmatic bronchitis and rhinitis with history of chronic sinusitis, complicated by ulcerative colitis. FOLLOWS FOR: cough-productive-drak greenish/brown in color; head congestion, chest congestion, slight wheezing. Once able to get phelgm up he feels little better. Head congestion bothers him more than chest congestion. No fever. On increased  prednisone for 6 months for flare of his ulcerative colitis. Now down to 20 mg daily. CXR 09/08/11-  Findings: Left lower lobe scarring is stable. Heart size is  normal. The lungs are otherwise clear. No pleural effusion. No  acute osseous abnormality. Left humeral bone anchors are in place.  Right AC joint degenerative change partly visualized.  IMPRESSION:  No acute cardiopulmonary process.  Original Report Authenticated By: Arline Asp, M.D.  08/28/13- 75 year old male former smoker followed for chronic asthmatic bronchitis and rhinitis with history of chronic sinusitis, complicated by ulcerative colitis. Had URI 2-3 wks. ago,increased sob,no energy,cough-lt. green, no fcs,occass. wheezing,denies cp or tightness. On chronic severe weight for ulcerative colitis. Advair Diskus is too expensive with his insurance .CXR 04/28/13 IMPRESSION:  COPD changes with minimal atelectasis versus scarring at left base.  No acute infiltrate.  Electronically Signed  By: Lavonia Dana M.D.  On: 04/28/2013 09:27  04/27/14- 75 year old male former smoker followed for chronic asthmatic bronchitis and rhinitis with history of chronic sinusitis, complicated by ulcerative colitis/ methotrexate/prednisone. FOLLOWS FOR: has congestion with productive cough-green in color for several days. Mild sore throat, no fever, mild hack cough progressed to som green sputum. Immunosuppressed so discussed abx, but we both want to wait. Discussed timing flu and pneumonia vaccine.  10/26/14-75 year old male former smoker followed for chronic asthmatic bronchitis and rhinitis with history of chronic sinusitis, complicated by ulcerative colitis/ methotrexate/prednisone. FOLLOW FOR:  Cough, PND.  no concerns.  10/26/2015-75 year old male former smoker followed for chronic asthmatic bronchitis, rhinitis, history chronic sinusitis, complicated by ulcerative colitis/methotrexate/prednisone FOLLOWS FOR: Pt reports some increased  congestion with cough- worse in the mornings. Denies SOB/wheezing.  Continues methotrexate 25 mg by mouth once weekly and prednisone 2.5 mg daily to control UC. It  signed routine chronic morning cough with scant green sputum-pattern unchanged. Not particularly short of breath. Some sinus congestion which he lives with but not much discharge or headache. Rare use of rescue inhaler but continues twice daily Advair. CXR 10/26/2014 IMPRESSION: Hyperexpanded lungs and left basilar atelectasis/scar without acute cardiopulmonary disease. Electronically Signed  By: Sandi Mariscal M.D.  On: 10/26/2014 09:57  ROS-see HPI Constitutional:   No-   weight loss, night sweats, fevers, chills, fatigue, lassitude. HEENT:   No-  headaches, difficulty swallowing, tooth/dental problems, sore throat,       No-  sneezing, itching, ear ache,  +nasal congestion, post nasal drip,  CV:  No-   chest pain, orthopnea, PND, swelling in lower extremities, anasarca,  dizziness, palpitations Resp:   shortness of breath with exertion or at rest.              +  productive cough,  No non-productive cough,  No- coughing up of blood.              +  change in color of mucus.  No- wheezing.   Skin: No-   rash or lesions. GI:  No-   heartburn, indigestion, abdominal pain, nausea, vomiting,  GU:  MS:  No-   joint pain or swelling.  Neuro-     nothing unusual Psych:  No- change in mood or affect. No depression or anxiety.  No memory loss.    Objective:   Physical Exam General- Alert, Oriented, Affect-appropriate, Distress- none acute,  Skin- rash-none, lesions- none, excoriation- none Lymphadenopathy- none Head- atraumatic            Eyes- Gross vision intact, PERRLA, conjunctivae clear secretions            Ears- Hearing, canals- normal, no red or fluid            Nose- Clear, No-Septal dev, mucus, polyps, erosion, perforation             Throat- Mallampati II , mucosa clear , drainage- none, tonsils- atrophic Neck-  flexible , trachea midline, no stridor , thyroid nl, carotid no bruit Chest - symmetrical excursion , unlabored           Heart/CV- RRR , no murmur , no gallop  , no rub, nl s1 s2                           - JVD- none , edema- none, stasis changes- none, varices- none           Lung- +Mild basilar crackle and wheeze, unlabored,   cough-none, unlabored , dullness-none,                                 rub- none,           Chest wall-  Abd-  Br/ Gen/ Rectal- Not done, not indicated Extrem- cyanosis- none, clubbing, none, atrophy- none, strength- nl Neuro- grossly intact to observation

## 2015-10-26 NOTE — Assessment & Plan Note (Signed)
Stable pattern chronic asthmatic bronchitis on maintenance Advair. Plan-PFT for status update

## 2015-10-26 NOTE — Assessment & Plan Note (Signed)
Chronic immunosuppression including methotrexate and prednisone. He feels stable and well-controlled by Dr. Cristina Gong Plan-update chest x-ray

## 2015-10-26 NOTE — Patient Instructions (Signed)
We can continue present meds- call for refill as needed  Order- CXR-   Dx chronic asthmatic bronchitis, ulcerative colitis              Schedule PFT

## 2016-02-25 ENCOUNTER — Ambulatory Visit (INDEPENDENT_AMBULATORY_CARE_PROVIDER_SITE_OTHER): Payer: Medicare Other | Admitting: Internal Medicine

## 2016-02-25 ENCOUNTER — Encounter: Payer: Self-pay | Admitting: Internal Medicine

## 2016-02-25 VITALS — BP 110/60 | HR 65 | Ht 70.0 in | Wt 170.0 lb

## 2016-02-25 DIAGNOSIS — J45909 Unspecified asthma, uncomplicated: Secondary | ICD-10-CM

## 2016-02-25 DIAGNOSIS — J449 Chronic obstructive pulmonary disease, unspecified: Secondary | ICD-10-CM

## 2016-02-25 DIAGNOSIS — J42 Unspecified chronic bronchitis: Secondary | ICD-10-CM | POA: Diagnosis not present

## 2016-02-25 DIAGNOSIS — K51919 Ulcerative colitis, unspecified with unspecified complications: Secondary | ICD-10-CM

## 2016-02-25 LAB — PULMONARY FUNCTION TEST
DL/VA % PRED: 100 %
DL/VA: 4.57 ml/min/mmHg/L
DLCO UNC % PRED: 88 %
DLCO cor % pred: 91 %
DLCO cor: 29.01 ml/min/mmHg
DLCO unc: 28.07 ml/min/mmHg
FEF 25-75 PRE: 1.43 L/s
FEF 25-75 Post: 1.59 L/sec
FEF2575-%CHANGE-POST: 11 %
FEF2575-%PRED-POST: 73 %
FEF2575-%Pred-Pre: 66 %
FEV1-%CHANGE-POST: 8 %
FEV1-%PRED-PRE: 83 %
FEV1-%Pred-Post: 90 %
FEV1-PRE: 2.51 L
FEV1-Post: 2.72 L
FEV1FVC-%CHANGE-POST: 5 %
FEV1FVC-%Pred-Pre: 92 %
FEV6-%CHANGE-POST: 4 %
FEV6-%PRED-POST: 98 %
FEV6-%PRED-PRE: 93 %
FEV6-PRE: 3.64 L
FEV6-Post: 3.81 L
FEV6FVC-%CHANGE-POST: 1 %
FEV6FVC-%PRED-PRE: 103 %
FEV6FVC-%Pred-Post: 105 %
FVC-%CHANGE-POST: 2 %
FVC-%Pred-Post: 93 %
FVC-%Pred-Pre: 90 %
FVC-Post: 3.86 L
FVC-Pre: 3.75 L
POST FEV1/FVC RATIO: 71 %
POST FEV6/FVC RATIO: 99 %
PRE FEV6/FVC RATIO: 97 %
Pre FEV1/FVC ratio: 67 %
RV % PRED: 121 %
RV: 3.07 L
TLC % pred: 101 %
TLC: 7.07 L

## 2016-02-25 MED ORDER — MONTELUKAST SODIUM 10 MG PO TABS: 10.0000 mg | ORAL_TABLET | Freq: Every evening | ORAL | Status: DC

## 2016-02-25 MED ORDER — ALBUTEROL SULFATE HFA 108 (90 BASE) MCG/ACT IN AERS: 2.0000 | INHALATION_SPRAY | RESPIRATORY_TRACT | Status: AC | PRN

## 2016-02-25 NOTE — Progress Notes (Signed)
Patient ID: [name], male    DOB: 11/26/1940, 75 y.o.   MRN: [id number]  HPI  04/27/14- 75 year old male former smoker followed for chronic asthmatic bronchitis and rhinitis with history of chronic sinusitis, complicated by ulcerative colitis/ methotrexate/prednisone. FOLLOWS FOR: has congestion with productive cough-green in color for several days. Mild sore throat, no fever, mild hack cough progressed to som green sputum. Immunosuppressed so discussed abx, but we both want to wait. Discussed timing flu and pneumonia vaccine.  10/26/14-75 year old male former smoker followed for chronic asthmatic bronchitis and rhinitis with history of chronic sinusitis, complicated by ulcerative colitis/ methotrexate/prednisone. FOLLOW FOR:  Cough, PND.  no concerns.  10/26/2015-75 year old male former smoker followed for chronic asthmatic bronchitis, rhinitis, history chronic sinusitis, complicated by ulcerative colitis/methotrexate/prednisone FOLLOWS FOR: Pt reports some increased congestion with cough- worse in the mornings. Denies SOB/wheezing.  Continues methotrexate 25 mg by mouth once weekly and prednisone 2.5 mg daily to control UC. It signed routine chronic morning cough with scant green sputum-pattern unchanged. Not particularly short of breath. Some sinus congestion which he lives with but not much discharge or headache. Rare use of rescue inhaler but continues twice daily Advair. CXR 10/26/2014 IMPRESSION: Hyperexpanded lungs and left basilar atelectasis/scar without acute cardiopulmonary disease. Electronically Signed  By: Sandi Mariscal M.D.  On: 10/26/2014 09:57  02/25/2016-75 year old male former smoker followed for chronic asthmatic bronchitis, rhinitis, history chronic sinusitis, complicated by ulcerative colitis/methotrexate/prednisone FOLLOWS FOR: Has to use Rescue inhaler some days(not as often)due to coughing up phelgm. Review PFT with patient today. Otherwise patient feels  like he is at his baseline. Mild chronic bronchitis pattern with no recent exacerbation. Sputum could be trace green but not frankly purulent or bloody. No fever or night sweats. He tolerates current hot weather okay. Remains on maintenance prednisone and methotrexate from his GI doctor for ulcerative colitis. CXR 10/26/2015 IMPRESSION: 1. Hyper aeration. No definite active process. 2. Bibasal linear scarring or atelectasis. 3. Cannot exclude faint pericardial calcification of questionable significance. Electronically Signed  By: Ivar Drape M.D.  On: 10/26/2015 09:37 PFT 02/25/2016-mild obstructive airways disease without response to bronchodilator. FVC 3.86/93%, FEV1 2.7 to/90%, FEV1/FVC 0.71, FEF 25-75 percent 1.59/73%. TLC 101%, DLCO 88%.  ROS-see HPI Constitutional:   No-   weight loss, night sweats, fevers, chills, fatigue, lassitude. HEENT:   No-  headaches, difficulty swallowing, tooth/dental problems, sore throat,       No-  sneezing, itching, ear ache,  +nasal congestion, post nasal drip,  CV:  No-   chest pain, orthopnea, PND, swelling in lower extremities, anasarca,  dizziness, palpitations Resp:   shortness of breath with exertion or at rest.              +  productive cough,  No non-productive cough,  No- coughing up of blood.              +  change in color of mucus.  No- wheezing.   Skin: No-   rash or lesions. GI:  No-   heartburn, indigestion, abdominal pain, nausea, vomiting,  GU:  MS:  No-   joint pain or swelling.  Neuro-     nothing unusual Psych:  No- change in mood or affect. No depression or anxiety.  No memory loss.    Objective:   Physical Exam General- Alert, Oriented, Affect-appropriate, Distress- none acute,  Skin- rash-none, lesions- none, excoriation- none Lymphadenopathy- none Head- atraumatic            Eyes- Gross  vision intact, PERRLA, conjunctivae clear secretions            Ears- Hearing, canals- normal, no red or fluid            Nose-  Clear, No-Septal dev, mucus, polyps, erosion, perforation             Throat- Mallampati II , mucosa clear , drainage- none, tonsils- atrophic Neck- flexible , trachea midline, no stridor , thyroid nl, carotid no bruit Chest - symmetrical excursion , unlabored           Heart/CV- RRR , no murmur , no gallop  , no rub, nl s1 s2                           - JVD- none , edema- none, stasis changes- none, varices- none           Lung- No-crackle and wheeze, unlabored,   cough-none, unlabored , dullness-none,                                 rub- none,           Chest wall-  Abd-  Br/ Gen/ Rectal- Not done, not indicated Extrem- cyanosis- none, clubbing, none, atrophy- none, strength- nl Neuro- grossly intact to observation

## 2016-02-25 NOTE — Progress Notes (Signed)
PFT done today. 

## 2016-02-25 NOTE — Patient Instructions (Signed)
3 month mail order scripts sent for albuterol rescue inhaler and singulair  Ok to continue present meds  Please call as needed

## 2016-02-25 NOTE — Assessment & Plan Note (Signed)
Minimal obstructive airways disease based on PFT. Chest sounds unusually clear for today. In spite of his years of chronic bronchitis/bronchiectasis symptoms, he is maintaining normal function. Plan-no changes. Refill Singulair and his rescue inhaler as discussed.

## 2016-07-10 ENCOUNTER — Other Ambulatory Visit: Payer: Self-pay | Admitting: Internal Medicine

## 2017-01-28 ENCOUNTER — Other Ambulatory Visit: Payer: Self-pay | Admitting: Internal Medicine

## 2018-03-08 ENCOUNTER — Encounter (HOSPITAL_COMMUNITY): Payer: Self-pay

## 2018-03-08 ENCOUNTER — Other Ambulatory Visit: Payer: Self-pay

## 2018-03-08 ENCOUNTER — Emergency Department (HOSPITAL_COMMUNITY)
Admission: EM | Admit: 2018-03-08 | Discharge: 2018-03-08 | Disposition: A | Discharge status: Home / Self Care | Payer: Medicare Other | Attending: Emergency Medicine | Admitting: Emergency Medicine

## 2018-03-08 ENCOUNTER — Emergency Department (HOSPITAL_COMMUNITY): Payer: Medicare Other

## 2018-03-08 DIAGNOSIS — J029 Acute pharyngitis, unspecified: Secondary | ICD-10-CM | POA: Diagnosis present

## 2018-03-08 DIAGNOSIS — N390 Urinary tract infection, site not specified: Secondary | ICD-10-CM | POA: Diagnosis not present

## 2018-03-08 DIAGNOSIS — J449 Chronic obstructive pulmonary disease, unspecified: Secondary | ICD-10-CM | POA: Diagnosis not present

## 2018-03-08 DIAGNOSIS — M791 Myalgia, unspecified site: Secondary | ICD-10-CM

## 2018-03-08 DIAGNOSIS — Z7982 Long term (current) use of aspirin: Secondary | ICD-10-CM | POA: Diagnosis not present

## 2018-03-08 DIAGNOSIS — Z96651 Presence of right artificial knee joint: Secondary | ICD-10-CM | POA: Insufficient documentation

## 2018-03-08 DIAGNOSIS — Z79899 Other long term (current) drug therapy: Secondary | ICD-10-CM | POA: Insufficient documentation

## 2018-03-08 DIAGNOSIS — Z87891 Personal history of nicotine dependence: Secondary | ICD-10-CM | POA: Diagnosis not present

## 2018-03-08 LAB — URINALYSIS, ROUTINE W REFLEX MICROSCOPIC
Bilirubin Urine: NEGATIVE
GLUCOSE, UA: NEGATIVE mg/dL
Hgb urine dipstick: NEGATIVE
Ketones, ur: NEGATIVE mg/dL
Nitrite: NEGATIVE
Protein, ur: NEGATIVE mg/dL
Specific Gravity, Urine: 1.01 (ref 1.005–1.030)
pH: 6 (ref 5.0–8.0)

## 2018-03-08 LAB — CBC WITH DIFFERENTIAL/PLATELET
Basophils Absolute: 0.1 10*3/uL (ref 0.0–0.1)
Basophils Relative: 1 %
EOS PCT: 3 %
Eosinophils Absolute: 0.3 10*3/uL (ref 0.0–0.7)
HCT: 39.5 % (ref 39.0–52.0)
HEMOGLOBIN: 13 g/dL (ref 13.0–17.0)
LYMPHS ABS: 0.8 10*3/uL (ref 0.7–4.0)
Lymphocytes Relative: 7 %
MCH: 30.4 pg (ref 26.0–34.0)
MCHC: 32.9 g/dL (ref 30.0–36.0)
MCV: 92.3 fL (ref 78.0–100.0)
MONOS PCT: 6 %
Monocytes Absolute: 0.7 10*3/uL (ref 0.1–1.0)
NEUTROS PCT: 83 %
Neutro Abs: 9.4 10*3/uL — ABNORMAL HIGH (ref 1.7–7.7)
Platelets: 278 10*3/uL (ref 150–400)
RBC: 4.28 MIL/uL (ref 4.22–5.81)
RDW: 15 % (ref 11.5–15.5)
WBC: 11.2 10*3/uL — AB (ref 4.0–10.5)

## 2018-03-08 LAB — BASIC METABOLIC PANEL
Anion gap: 8 (ref 5–15)
BUN: 18 mg/dL (ref 8–23)
CHLORIDE: 110 mmol/L (ref 98–111)
CO2: 23 mmol/L (ref 22–32)
Calcium: 9.4 mg/dL (ref 8.9–10.3)
Creatinine, Ser: 0.97 mg/dL (ref 0.61–1.24)
GFR calc Af Amer: >60 mL/min (ref >60)
GFR calc non Af Amer: >60 mL/min (ref >60)
GLUCOSE: 100 mg/dL — AB (ref 70–99)
POTASSIUM: 4 mmol/L (ref 3.5–5.1)
Sodium: 141 mmol/L (ref 135–145)

## 2018-03-08 LAB — CK: Total CK: 134 U/L (ref 49–397)

## 2018-03-08 MED ORDER — CEPHALEXIN 500 MG PO CAPS: 500.0000 mg | ORAL_CAPSULE | Freq: Two times a day (BID) | ORAL | 0 refills | Status: DC

## 2018-03-08 MED ORDER — ACETAMINOPHEN 325 MG PO TABS
650.0000 mg | ORAL_TABLET | Freq: Once | ORAL | Status: DC
Start: 2018-03-08 — End: 2018-03-08

## 2018-03-08 MED ORDER — CEPHALEXIN 500 MG PO CAPS: 500.0000 mg | ORAL_CAPSULE | Freq: Two times a day (BID) | ORAL | 0 refills | Status: AC

## 2018-03-08 NOTE — ED Triage Notes (Signed)
Pt states that he had a bronchoscope Wednesday at [facility]. Pt states that he had been coughing and had a "lung infection", which was treated with cipro. Pt reports body soreness, as well as throat soreness. Pt told by dr to come to ED for leg soreness.

## 2018-03-08 NOTE — Discharge Instructions (Addendum)
You were given a prescription for antibiotics. Please take the antibiotic prescription fully.   Please follow up with your primary care provider within 5-7 days for re-evaluation of your symptoms.   Please return to the emergency department for any new or worsening symptoms.

## 2018-03-08 NOTE — ED Provider Notes (Signed)
[facility] DEPT Provider Note   CSN: [id number] Arrival date & time: 03/08/18  1147     History   Chief Complaint Chief Complaint  Patient presents with  . Sore  . Cough    HPI [name] is a 77 y.o. male.  HPI   Patient is a 77 year old male with a history of COPD, peripheral edema, ulcerative colitis who presents emergency department today for evaluation of a sore throat and muscle aches.  Patient underwent a bronchoscopy 3 days ago.  Denies any applications with this procedure.  States the next day he woke up and had a mild sore throat which has persisted.  Also reports some nasal congestion.  He is also reporting muscle aches to his bilateral arms and bilateral legs.  Has no pain at rest, but whenever he tries to walk his entire legs are painful.  Denies any unusual bilateral lower extremity swelling.  Does have a history of lower extremity edema that is not particularly worse today.  Also has some muscle pain to his abdomen, but denies any persistent abdominal pain, nausea, vomiting, or urinary symptoms.  Has chronic diarrhea from his ulcerative colitis.  Denies any chest pain or shortness of breath.  Has had very mild intermittent dry cough.  Denies any fevers or chills.  Patient's wife at bedside assists with the history taking.  Past Medical History:  Diagnosis Date  . Asthma   . Chronic bronchitis   . COPD (chronic obstructive pulmonary disease) ([place])   . Hearing loss in left ear   . Peripheral edema   . Sinusitis   . Ulcerative colitis     Patient Active Problem List   Diagnosis Date Noted  . OA (osteoarthritis) of knee 08/13/2015  . Chronic bronchitis with acute exacerbation ([place]) 09/23/2013  . Atherosclerosis of native arteries of the extremities with intermittent claudication 06/18/2012  . Pain in limb 06/18/2012  . Claudication ([place]) 05/30/2012  . Frequency of urination 03/13/2012  . General medical examination  05/29/2011  . Myalgia 04/04/2011  . DERMATOPHYTOSIS OF NAIL 09/22/2010  . INGUINAL HERNIA, LEFT 09/22/2010  . DECREASED HEARING 12/23/2009  . RHINITIS 12/23/2009  . UNSPECIFIED DISORDER OF SKIN&SUBCUTANEOUS TISSUE 12/23/2009  . HYPERLIPIDEMIA 09/13/2009  . LYMPHEDEMA 08/27/2009  . ELEVATED BLOOD PRESSURE WITHOUT DIAGNOSIS OF HYPERTENSION 08/27/2009  . BENIGN PROSTATIC HYPERTROPHY, MILD, HX OF 08/27/2009  . OTHER CHRONIC SINUSITIS 08/09/2007  . BRONCHITIS, CHRONIC 08/09/2007  . HIATAL HERNIA 08/09/2007  . Ulcerative colitis ([place]) 08/09/2007    Past Surgical History:  Procedure Laterality Date  . colonscopy     several with polys removed  . HERNIA REPAIR  2013   x 3 hernias repaired  . ROTATOR CUFF REPAIR     bilateral shoulders   . TOTAL KNEE ARTHROPLASTY Right 08/13/2015   Procedure: RIGHT TOTAL KNEE ARTHROPLASTY;  Surgeon: Gaynelle Arabian, MD;  Location: [facility];  Service: Orthopedics;  Laterality: Right;        Home Medications    Prior to Admission medications   Medication Sig Start Date End Date Taking? Authorizing Provider  acetaminophen (TYLENOL) 500 MG tablet Take 1,000 mg by mouth every 8 (eight) hours as needed for moderate pain.    Yes [provider]  ADVAIR DISKUS 100-50 MCG/DOSE AEPB USE 1 INHALATION INTO THE  LUNGS 2 TIMES DAILY 07/10/16  Yes Young, Clinton D, MD  albuterol (PROVENTIL HFA;VENTOLIN HFA) 108 (90 Base) MCG/ACT inhaler Inhale 2 puffs into the lungs every 4 (four)  hours as needed for wheezing or shortness of breath. 02/25/16  Yes Young, Tarri Fuller D, MD  aspirin 81 MG chewable tablet Chew 81 mg by mouth at bedtime.   Yes [provider]  Calcium Carbonate-Vitamin D (CALCIUM 600/VITAMIN D) 600-400 MG-UNIT chew tablet Chew 1 tablet by mouth daily.   Yes [provider]  Co-Enzyme Q-10 30 MG CAPS Take 30 mg by mouth 3 (three) times daily.   Yes [provider]  diphenoxylate-atropine (LOMOTIL) 2.5-0.025 MG tablet Take 1 tablet  by mouth 4 (four) times daily.   Yes [provider]  ferrous sulfate 325 (65 FE) MG tablet Take 325 mg by mouth daily with breakfast.   Yes [provider]  folic acid (FOLVITE) 1 MG tablet Take by mouth. 09/09/13  Yes [provider]  glucosamine-chondroitin 500-400 MG tablet Take 1 tablet by mouth 3 (three) times daily.   Yes [provider]  guaiFENesin (MUCINEX) 600 MG 12 hr tablet Take 600 mg by mouth daily.    Yes [provider]  loratadine (CLARITIN) 10 MG tablet Take 10 mg by mouth daily.     Yes [provider]  methotrexate (RHEUMATREX) 2.5 MG tablet  01/09/16  Yes [provider]  montelukast (SINGULAIR) 10 MG tablet TAKE 1 TABLET BY MOUTH  EVERY EVENING 01/29/17  Yes Young, Tarri Fuller D, MD  Multiple Vitamin (MULTIVITAMIN) capsule Take 1 capsule by mouth daily.   Yes [provider]  pantoprazole (PROTONIX) 40 MG tablet Take 40 mg by mouth every evening.    Yes [provider]  predniSONE (DELTASONE) 1 MG tablet Take 3 mg by mouth daily. 01/04/18  Yes [provider]  simvastatin (ZOCOR) 20 MG tablet Take 20 mg by mouth daily at 6 PM.    Yes [provider]  tamsulosin (FLOMAX) 0.4 MG CAPS capsule Take 2 capsules (0.8 mg total) by mouth daily after supper. 08/14/15  Yes Aluisio, Pilar Plate, MD  cephALEXin (KEFLEX) 500 MG capsule Take 1 capsule (500 mg total) by mouth 2 (two) times daily for 7 days. 03/08/18 03/15/18  Jamus Loving S, PA-C    Family History Family History  Problem Relation Age of Onset  . Heart disease Mother        MI  . Hyperlipidemia Mother   . Other Mother        varicose veins  . Heart attack Mother   . COPD Sister   . Hyperlipidemia Father   . Hypertension Father     Social History Social History   Tobacco Use  . Smoking status: Former Smoker    Packs/day: 2.00    Years: 15.00    Pack years: 30.00    Types: Cigarettes    Last attempt to quit: 08/08/1971    Years  since quitting: 46.6  . Smokeless tobacco: Former Systems developer    Quit date: 08/08/1971  Substance Use Topics  . Alcohol use: No  . Drug use: No     Allergies   Lipitor [atorvastatin calcium]; Moxifloxacin; Penicillins; Ciprofloxacin; and Molds & smuts   Review of Systems Review of Systems  Constitutional: Negative for fever.  HENT: Negative for congestion.   Eyes: Negative for visual disturbance.  Respiratory: Positive for cough. Negative for shortness of breath.   Cardiovascular: Positive for leg swelling (chronic, unchanged). Negative for chest pain.  Gastrointestinal: Positive for abdominal pain (muscular) and diarrhea (chronic). Negative for blood in stool, constipation, nausea and vomiting.  Genitourinary: Negative for dysuria, hematuria and urgency.  Musculoskeletal: Positive for myalgias.  Skin: Negative for wound.  Neurological: Negative for dizziness, weakness, light-headedness, numbness and headaches.    Physical Exam Updated Vital Signs BP 125/82   Pulse (!) 54   Temp 97.6 F (36.4 C) (Oral)   Resp 15   Ht 5' 10"  (1.778 m)   Wt 77.1 kg (170 lb)   SpO2 95%   BMI 24.39 kg/m   Physical Exam  Constitutional: He appears well-developed and well-nourished.  HENT:  Head: Normocephalic and atraumatic.  Mild pharyngeal erythema.  No tonsillar swelling or exudates noted.  Mucous membranes are moist.  Uvula is midline.  Eyes: Conjunctivae are normal.  Neck: Neck supple.  Cardiovascular: Normal rate, regular rhythm, normal heart sounds and intact distal pulses.  No murmur heard. Pulmonary/Chest: Effort normal. No respiratory distress.  Mild crackles to bilat bases. No wheezing.   Abdominal: Soft. Bowel sounds are normal. He exhibits no distension. There is no tenderness. There is no guarding.  Musculoskeletal:  1+ pitting edema to the LLE (chronic per pt, unchanged). No erythema or TTP to the BLE or BUE.  Neurological: He is alert.  5/5 strength to the bilateral upper and  lower extremity's.  Walks with steady gait.  Skin: Skin is warm and dry. Capillary refill takes less than 2 seconds.  Psychiatric: He has a normal mood and affect.  Nursing note and vitals reviewed.  ED Treatments / Results  Labs (all labs ordered are listed, but only abnormal results are displayed) Labs Reviewed  CBC WITH DIFFERENTIAL/PLATELET - Abnormal; Notable for the following components:      Result Value   WBC 11.2 (*)    Neutro Abs 9.4 (*)    All other components within normal limits  BASIC METABOLIC PANEL - Abnormal; Notable for the following components:   Glucose, Bld 100 (*)    All other components within normal limits  URINALYSIS, ROUTINE W REFLEX MICROSCOPIC - Abnormal; Notable for the following components:   Leukocytes, UA MODERATE (*)    Bacteria, UA RARE (*)    All other components within normal limits  URINE CULTURE  CK    EKG None  Radiology Dg Chest 2 View  Result Date: 03/08/2018 CLINICAL DATA:  Cough and body soreness.  Bronchoscopy 2 days ago. EXAM: CHEST - 2 VIEW COMPARISON:  10/26/2015 FINDINGS: The cardiomediastinal silhouette is within normal limits. The lungs are hyperinflated. There is mild streaky left basilar opacity, partly chronic though perhaps slightly increased from the prior study. The right lung is clear. No pleural effusion or pneumothorax is identified. No acute osseous abnormality is seen. IMPRESSION: Mild left basilar atelectasis or scarring, stable to slightly increased. Electronically Signed   By: Logan Bores M.D.   On: 03/08/2018 13:21    Procedures Procedures (including critical care time)  Medications Ordered in ED Medications - No data to display   Initial Impression / Assessment and Plan / ED Course  I have reviewed the triage vital signs and the nursing notes.  Pertinent labs & imaging results that were available during my care of the patient were reviewed by me and considered in my medical decision making (see chart for  details).    Discussed pt presentation and exam findings with Dr. Ashok Cordia, who evaluated the patient, reviewed the workup and agrees with the plan for discharge on keflex for suspected UTI.   Final Clinical Impressions(s) / ED Diagnoses   Final diagnoses:  Urinary tract infection without hematuria, site unspecified  Muscle ache  Patient presenting with a sore throat after bronchoscopy procedure.  Also complaining of muscle aches to his bilateral upper and lower extremities beginning yesterday.  Vital signs stable.  Afebrile.  Patient is well-appearing.  Nontoxic.  No significant muscle tenderness on exam.  Is able to ambulate in the room with steady gait.  Strength is normal.  Lab work is benign.  CK is negative.  Do not suspect rhabdo in this patient.  Has mild elevated white blood cell count of 11.2.    Mild crackles noted on pulmonary exam however patient has history of COPD and suspect this is likely chronic.  Chest x-ray shows bibasilar atelectasis and scarring.  No significant cough or sputum production to suggest COPD exacerbation or pneumonia.   Patient's UA is suggestive of urinary tract infection.  He is asymptomatic however will start him on Keflex and send a urine culture.  Have advised him to take over-the-counter Tylenol and Motrin for his muscle aches and complete salt water gargles for his sore throat.  Suspect his sore throat is secondary to his recent bronchoscopy.  He does not have any exudates or tonsillar swelling on exam to suggest strep pharyngitis.  Advised and follow-up with PCP next week and return to the ER if he has any new or worsening symptoms.  Patient and his wife at bedside understand the plan reasons return immediately to the area.  All questions answered  ED Discharge Orders        Ordered    cephALEXin (KEFLEX) 500 MG capsule  2 times daily,   Status:  Discontinued     03/08/18 1453    cephALEXin (KEFLEX) 500 MG capsule  2 times daily,   Status:  Discontinued      03/08/18 1454    cephALEXin (KEFLEX) 500 MG capsule  2 times daily     03/08/18 1454       Jeany Seville S, PA-C 03/08/18 1502    Lajean Saver, MD 03/12/18 [phone]

## 2018-03-09 LAB — URINE CULTURE: Culture: NO GROWTH

## 2018-04-07 DIAGNOSIS — J189 Pneumonia, unspecified organism: Secondary | ICD-10-CM

## 2018-04-07 HISTORY — DX: Pneumonia, unspecified organism: J18.9

## 2018-05-09 ENCOUNTER — Inpatient Hospital Stay (HOSPITAL_COMMUNITY)
Admission: EM | Admit: 2018-05-09 | Discharge: 2018-05-13 | DRG: 387 | Disposition: A | Discharge status: Home / Self Care | Payer: Medicare Other | Attending: Internal Medicine | Admitting: Internal Medicine

## 2018-05-09 ENCOUNTER — Emergency Department (HOSPITAL_COMMUNITY): Payer: Medicare Other

## 2018-05-09 ENCOUNTER — Other Ambulatory Visit: Payer: Self-pay

## 2018-05-09 ENCOUNTER — Encounter (HOSPITAL_COMMUNITY): Payer: Self-pay | Admitting: Emergency Medicine

## 2018-05-09 DIAGNOSIS — Z8249 Family history of ischemic heart disease and other diseases of the circulatory system: Secondary | ICD-10-CM | POA: Diagnosis not present

## 2018-05-09 DIAGNOSIS — J45909 Unspecified asthma, uncomplicated: Secondary | ICD-10-CM

## 2018-05-09 DIAGNOSIS — Z9841 Cataract extraction status, right eye: Secondary | ICD-10-CM | POA: Diagnosis not present

## 2018-05-09 DIAGNOSIS — Z880 Allergy status to penicillin: Secondary | ICD-10-CM | POA: Diagnosis not present

## 2018-05-09 DIAGNOSIS — J449 Chronic obstructive pulmonary disease, unspecified: Secondary | ICD-10-CM | POA: Diagnosis present

## 2018-05-09 DIAGNOSIS — N182 Chronic kidney disease, stage 2 (mild): Secondary | ICD-10-CM | POA: Diagnosis present

## 2018-05-09 DIAGNOSIS — Z91048 Other nonmedicinal substance allergy status: Secondary | ICD-10-CM

## 2018-05-09 DIAGNOSIS — H9192 Unspecified hearing loss, left ear: Secondary | ICD-10-CM | POA: Diagnosis present

## 2018-05-09 DIAGNOSIS — K219 Gastro-esophageal reflux disease without esophagitis: Secondary | ICD-10-CM | POA: Diagnosis present

## 2018-05-09 DIAGNOSIS — E7800 Pure hypercholesterolemia, unspecified: Secondary | ICD-10-CM | POA: Diagnosis present

## 2018-05-09 DIAGNOSIS — Z961 Presence of intraocular lens: Secondary | ICD-10-CM | POA: Diagnosis present

## 2018-05-09 DIAGNOSIS — Z87891 Personal history of nicotine dependence: Secondary | ICD-10-CM

## 2018-05-09 DIAGNOSIS — Z825 Family history of asthma and other chronic lower respiratory diseases: Secondary | ICD-10-CM | POA: Diagnosis not present

## 2018-05-09 DIAGNOSIS — J310 Chronic rhinitis: Secondary | ICD-10-CM | POA: Diagnosis present

## 2018-05-09 DIAGNOSIS — K5100 Ulcerative (chronic) pancolitis without complications: Secondary | ICD-10-CM

## 2018-05-09 DIAGNOSIS — N400 Enlarged prostate without lower urinary tract symptoms: Secondary | ICD-10-CM | POA: Diagnosis present

## 2018-05-09 DIAGNOSIS — Z96651 Presence of right artificial knee joint: Secondary | ICD-10-CM | POA: Diagnosis present

## 2018-05-09 DIAGNOSIS — Z9842 Cataract extraction status, left eye: Secondary | ICD-10-CM | POA: Diagnosis not present

## 2018-05-09 DIAGNOSIS — Z7952 Long term (current) use of systemic steroids: Secondary | ICD-10-CM

## 2018-05-09 DIAGNOSIS — M79609 Pain in unspecified limb: Secondary | ICD-10-CM | POA: Diagnosis not present

## 2018-05-09 DIAGNOSIS — Z83438 Family history of other disorder of lipoprotein metabolism and other lipidemia: Secondary | ICD-10-CM | POA: Diagnosis not present

## 2018-05-09 DIAGNOSIS — E785 Hyperlipidemia, unspecified: Secondary | ICD-10-CM | POA: Diagnosis present

## 2018-05-09 DIAGNOSIS — Z8701 Personal history of pneumonia (recurrent): Secondary | ICD-10-CM | POA: Diagnosis not present

## 2018-05-09 DIAGNOSIS — Z8349 Family history of other endocrine, nutritional and metabolic diseases: Secondary | ICD-10-CM

## 2018-05-09 DIAGNOSIS — K51011 Ulcerative (chronic) pancolitis with rectal bleeding: Principal | ICD-10-CM | POA: Diagnosis present

## 2018-05-09 DIAGNOSIS — I890 Lymphedema, not elsewhere classified: Secondary | ICD-10-CM | POA: Diagnosis present

## 2018-05-09 DIAGNOSIS — Z881 Allergy status to other antibiotic agents status: Secondary | ICD-10-CM | POA: Diagnosis not present

## 2018-05-09 DIAGNOSIS — Z888 Allergy status to other drugs, medicaments and biological substances status: Secondary | ICD-10-CM

## 2018-05-09 DIAGNOSIS — Z79899 Other long term (current) drug therapy: Secondary | ICD-10-CM | POA: Diagnosis not present

## 2018-05-09 DIAGNOSIS — K51019 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: Secondary | ICD-10-CM

## 2018-05-09 DIAGNOSIS — Z7951 Long term (current) use of inhaled steroids: Secondary | ICD-10-CM

## 2018-05-09 DIAGNOSIS — R609 Edema, unspecified: Secondary | ICD-10-CM | POA: Diagnosis not present

## 2018-05-09 DIAGNOSIS — Z7982 Long term (current) use of aspirin: Secondary | ICD-10-CM

## 2018-05-09 HISTORY — DX: Pneumonia, unspecified organism: J18.9

## 2018-05-09 HISTORY — DX: Pure hypercholesterolemia, unspecified: E78.00

## 2018-05-09 HISTORY — DX: Unspecified osteoarthritis, unspecified site: M19.90

## 2018-05-09 HISTORY — DX: Personal history of other diseases of the digestive system: Z87.19

## 2018-05-09 HISTORY — DX: Lymphedema, not elsewhere classified: I89.0

## 2018-05-09 HISTORY — DX: Gastro-esophageal reflux disease without esophagitis: K21.9

## 2018-05-09 LAB — COMPREHENSIVE METABOLIC PANEL
ALK PHOS: 59 U/L (ref 38–126)
ALT: 27 U/L (ref 0–44)
AST: 22 U/L (ref 15–41)
Albumin: 3.1 g/dL — ABNORMAL LOW (ref 3.5–5.0)
Anion gap: 10 (ref 5–15)
BUN: 19 mg/dL (ref 8–23)
CALCIUM: 8.9 mg/dL (ref 8.9–10.3)
CHLORIDE: 104 mmol/L (ref 98–111)
CO2: 21 mmol/L — AB (ref 22–32)
Creatinine, Ser: 1.12 mg/dL (ref 0.61–1.24)
GFR calc non Af Amer: >60 mL/min (ref >60)
Glucose, Bld: 88 mg/dL (ref 70–99)
Potassium: 4.1 mmol/L (ref 3.5–5.1)
SODIUM: 135 mmol/L (ref 135–145)
Total Bilirubin: 0.7 mg/dL (ref 0.3–1.2)
Total Protein: 5.7 g/dL — ABNORMAL LOW (ref 6.5–8.1)

## 2018-05-09 LAB — URINALYSIS, ROUTINE W REFLEX MICROSCOPIC
Bilirubin Urine: NEGATIVE
Glucose, UA: NEGATIVE mg/dL
Hgb urine dipstick: NEGATIVE
Ketones, ur: NEGATIVE mg/dL
Leukocytes, UA: NEGATIVE
NITRITE: NEGATIVE
Protein, ur: NEGATIVE mg/dL
SPECIFIC GRAVITY, URINE: 1.003 — AB (ref 1.005–1.030)
pH: 7 (ref 5.0–8.0)

## 2018-05-09 LAB — C DIFFICILE QUICK SCREEN W PCR REFLEX
C DIFFICILE (CDIFF) TOXIN: NEGATIVE
C DIFFICLE (CDIFF) ANTIGEN: NEGATIVE
C Diff interpretation: NOT DETECTED

## 2018-05-09 LAB — CBC
HCT: 42.1 % (ref 39.0–52.0)
HEMOGLOBIN: 13.9 g/dL (ref 13.0–17.0)
MCH: 30.5 pg (ref 26.0–34.0)
MCHC: 33 g/dL (ref 30.0–36.0)
MCV: 92.3 fL (ref 78.0–100.0)
Platelets: 203 10*3/uL (ref 150–400)
RBC: 4.56 MIL/uL (ref 4.22–5.81)
RDW: 15.3 % (ref 11.5–15.5)
WBC: 5.7 10*3/uL (ref 4.0–10.5)

## 2018-05-09 LAB — LIPASE, BLOOD: LIPASE: 21 U/L (ref 11–51)

## 2018-05-09 MED ORDER — SODIUM CHLORIDE 0.9 % IV SOLN
INTRAVENOUS | Status: DC
  Administered 2018-05-09 – 2018-05-11 (×2): via INTRAVENOUS

## 2018-05-09 MED ORDER — METRONIDAZOLE IN NACL 5-0.79 MG/ML-% IV SOLN
500.0000 mg | Freq: Three times a day (TID) | INTRAVENOUS | Status: DC
  Administered 2018-05-09: 500 mg via INTRAVENOUS
  Filled 2018-05-09 (×3): qty 100

## 2018-05-09 MED ORDER — ASPIRIN 81 MG PO CHEW
81.0000 mg | CHEWABLE_TABLET | Freq: Every day | ORAL | Status: DC
  Administered 2018-05-10 – 2018-05-12 (×4): 81 mg via ORAL
  Filled 2018-05-09 (×4): qty 1

## 2018-05-09 MED ORDER — PREDNISONE 20 MG PO TABS: 30.0000 mg | ORAL_TABLET | Freq: Every day | ORAL | Status: DC

## 2018-05-09 MED ORDER — ENOXAPARIN SODIUM 40 MG/0.4ML ~~LOC~~ SOLN
40.0000 mg | SUBCUTANEOUS | Status: DC
  Administered 2018-05-09 – 2018-05-12 (×4): 40 mg via SUBCUTANEOUS
  Filled 2018-05-09 (×4): qty 0.4

## 2018-05-09 MED ORDER — ALBUTEROL SULFATE (2.5 MG/3ML) 0.083% IN NEBU: 2.5000 mg | INHALATION_SOLUTION | RESPIRATORY_TRACT | Status: DC | PRN

## 2018-05-09 MED ORDER — ACETAMINOPHEN 325 MG PO TABS: 650.0000 mg | ORAL_TABLET | Freq: Four times a day (QID) | ORAL | Status: DC | PRN

## 2018-05-09 MED ORDER — METHOTREXATE 2.5 MG PO TABS
25.0000 mg | ORAL_TABLET | ORAL | Status: DC
  Administered 2018-05-12: 25 mg via ORAL
  Filled 2018-05-09: qty 10

## 2018-05-09 MED ORDER — LORATADINE 10 MG PO TABS
10.0000 mg | ORAL_TABLET | Freq: Every day | ORAL | Status: DC
  Administered 2018-05-10 – 2018-05-13 (×4): 10 mg via ORAL
  Filled 2018-05-09 (×4): qty 1

## 2018-05-09 MED ORDER — MOMETASONE FURO-FORMOTEROL FUM 100-5 MCG/ACT IN AERO
2.0000 | INHALATION_SPRAY | Freq: Two times a day (BID) | RESPIRATORY_TRACT | Status: DC
  Administered 2018-05-10 – 2018-05-13 (×6): 2 via RESPIRATORY_TRACT
  Filled 2018-05-09: qty 8.8

## 2018-05-09 MED ORDER — FERROUS SULFATE 325 (65 FE) MG PO TABS
325.0000 mg | ORAL_TABLET | Freq: Every day | ORAL | Status: DC
  Administered 2018-05-10 – 2018-05-13 (×4): 325 mg via ORAL
  Filled 2018-05-09 (×4): qty 1

## 2018-05-09 MED ORDER — SIMVASTATIN 20 MG PO TABS
20.0000 mg | ORAL_TABLET | Freq: Every day | ORAL | Status: DC
  Administered 2018-05-09 – 2018-05-12 (×4): 20 mg via ORAL
  Filled 2018-05-09 (×4): qty 1

## 2018-05-09 MED ORDER — MONTELUKAST SODIUM 10 MG PO TABS
10.0000 mg | ORAL_TABLET | Freq: Every evening | ORAL | Status: DC
  Administered 2018-05-09 – 2018-05-12 (×4): 10 mg via ORAL
  Filled 2018-05-09 (×4): qty 1

## 2018-05-09 MED ORDER — SODIUM CHLORIDE 0.9 % IV SOLN
Freq: Once | INTRAVENOUS | Status: AC
  Administered 2018-05-09: 17:00:00 via INTRAVENOUS

## 2018-05-09 MED ORDER — PANTOPRAZOLE SODIUM 40 MG PO TBEC
40.0000 mg | DELAYED_RELEASE_TABLET | Freq: Every evening | ORAL | Status: DC
  Administered 2018-05-09 – 2018-05-12 (×4): 40 mg via ORAL
  Filled 2018-05-09 (×4): qty 1

## 2018-05-09 MED ORDER — ACETAMINOPHEN 650 MG RE SUPP: 650.0000 mg | Freq: Four times a day (QID) | RECTAL | Status: DC | PRN

## 2018-05-09 MED ORDER — METHYLPREDNISOLONE SODIUM SUCC 40 MG IJ SOLR
20.0000 mg | Freq: Three times a day (TID) | INTRAMUSCULAR | Status: DC
  Administered 2018-05-10 – 2018-05-13 (×11): 20 mg via INTRAVENOUS
  Filled 2018-05-09 (×10): qty 1

## 2018-05-09 MED ORDER — ALUM & MAG HYDROXIDE-SIMETH 200-200-20 MG/5ML PO SUSP
30.0000 mL | Freq: Four times a day (QID) | ORAL | Status: DC | PRN
  Administered 2018-05-09: 30 mL via ORAL
  Filled 2018-05-09: qty 30

## 2018-05-09 MED ORDER — SODIUM CHLORIDE 0.9 % IV SOLN
INTRAVENOUS | Status: AC
  Administered 2018-05-09 – 2018-05-10 (×2): via INTRAVENOUS

## 2018-05-09 MED ORDER — FOLIC ACID 1 MG PO TABS
1.0000 mg | ORAL_TABLET | Freq: Every day | ORAL | Status: DC
  Administered 2018-05-10 – 2018-05-13 (×3): 1 mg via ORAL
  Filled 2018-05-09 (×4): qty 1

## 2018-05-09 NOTE — ED Notes (Signed)
Pt ambulatory to the bathroom with no reported issues.

## 2018-05-09 NOTE — H&P (Signed)
History and Physical    [name] VFI:[id number] DOB: 1940-08-17 DOA: 05/09/2018  PCP: Chesley Noon, MD  Patient coming from: GI office (Dr. Cristina Gong)  I have personally briefly reviewed patient's old medical records in [place]  Chief Complaint: Ulcerative colitis  HPI: [name] is a 77 y.o. male with medical history significant of ulcerative colitis, asthma, hyperlipidemia, and recent Pseudomonas pneumonia presented to [facility] ED directly from his GI office with fever, diarrhea, and crampy abdominal pain.  Patient underwent sigmoidoscopy by Dr. Cristina Gong with [facility] on 05/08/2018.  Procedure essentially became a colonoscopy and showed diffuse colitis extending to the ascending colon.    Patient followed up with GI today (05/09/2018) and was noted to have a fever of 102F at the office.  Patient states that he recently completed long-term antibiotic therapy via a PICC line for his Pseudomonas pneumonia.  After completion, he began to have frequent bowel movements consisting of loose mucoid stool with occasional blood.  He had associated cramping pain during bowel movements.  Frequency would be up to 12 movements per day.  He reports his stool was extremely malodorous.  He has had recent chills and occasional lightheadedness.  Reports poor appetite recently.  He denied any subjective fevers, diaphoresis, chest pain, dyspnea.  Given his lower endoscopy results of pancolitis, diarrhea, and fevers he was sent to the emergency department for evaluation and treatment of potential ulcerative colitis flare versus infection.  ED Course:  In the ED vital signs were stable.  CBC and CMP were within normal limits aside from decreased total protein of 5.7, albumin 3.1.  Abdominal plain film showing nonobstructive bowel gas pattern without free air.  He was started on metronidazole for potential C. difficile.  Stool was collected and labs resulted negative for C. difficile.  Review  of Systems: As per HPI otherwise 10 point review of systems negative.    Past Medical History:  Diagnosis Date  . Arthritis    "joints" (05/09/2018)  . Asthma   . Chronic bronchitis   . COPD (chronic obstructive pulmonary disease) ([place])   . GERD (gastroesophageal reflux disease)   . Hearing loss in left ear   . High cholesterol   . History of hiatal hernia   . Lymphedema of left leg    "foot" (05/09/2018)  . Peripheral edema   . Pneumonia 04/2018  . Sinusitis   . Ulcerative colitis     Past Surgical History:  Procedure Laterality Date  . CATARACT EXTRACTION W/ INTRAOCULAR LENS  IMPLANT, BILATERAL Bilateral   . COLONOSCOPY W/ BIOPSIES AND POLYPECTOMY     several with polys removed  . HERNIA REPAIR  2013   x 3 hernias repaired  . JOINT REPLACEMENT    . SHOULDER OPEN ROTATOR CUFF REPAIR Bilateral   . TOTAL KNEE ARTHROPLASTY Right 08/13/2015   Procedure: RIGHT TOTAL KNEE ARTHROPLASTY;  Surgeon: Gaynelle Arabian, MD;  Location: [facility];  Service: Orthopedics;  Laterality: Right;     reports that he quit smoking about 46 years ago. His smoking use included cigarettes. He has a 30.00 pack-year smoking history. He quit smokeless tobacco use about 46 years ago.  His smokeless tobacco use included chew. He reports that he does not drink alcohol or use drugs.  Allergies  Allergen Reactions  . Lipitor [Atorvastatin Calcium]     Muscle ache  . Moxifloxacin     REACTION: rash  . Penicillins Other (See Comments)    Patient has  never had reaction, but Dr Glynn Octave told him not to take due to possible cross-sensitivity with mold allergy Has patient had a PCN reaction causing immediate rash, facial/tongue/throat swelling, SOB or lightheadedness with hypotension: No Has patient had a PCN reaction causing severe rash involving mucus membranes or skin necrosis: No Has patient had a PCN reaction that required hospitalization: No Has patient had a PCN reaction occurring within the last 10 years:  No If all of the above   . Ciprofloxacin Diarrhea  . Molds & Smuts Rash    Family History  Problem Relation Age of Onset  . Heart disease Mother        MI  . Hyperlipidemia Mother   . Other Mother        varicose veins  . Heart attack Mother   . COPD Sister   . Hyperlipidemia Father   . Hypertension Father     Prior to Admission medications   Medication Sig Start Date End Date Taking? Authorizing Provider  acetaminophen (TYLENOL) 500 MG tablet Take 1,000 mg by mouth every 8 (eight) hours as needed for moderate pain.    Yes [provider]  ADVAIR DISKUS 100-50 MCG/DOSE AEPB USE 1 INHALATION INTO THE  LUNGS 2 TIMES DAILY 07/10/16  Yes Young, Clinton D, MD  albuterol (PROVENTIL HFA;VENTOLIN HFA) 108 (90 Base) MCG/ACT inhaler Inhale 2 puffs into the lungs every 4 (four) hours as needed for wheezing or shortness of breath. 02/25/16  Yes Young, Tarri Fuller D, MD  aspirin 81 MG chewable tablet Chew 81 mg by mouth at bedtime.   Yes [provider]  Calcium Carbonate-Vitamin D (CALCIUM 600/VITAMIN D) 600-400 MG-UNIT chew tablet Chew 1 tablet by mouth daily.   Yes [provider]  Co-Enzyme Q-10 30 MG CAPS Take 30 mg by mouth daily.    Yes [provider]  diphenoxylate-atropine (LOMOTIL) 2.5-0.025 MG tablet Take 2 tablets by mouth 2 (two) times daily.    Yes [provider]  ferrous sulfate 325 (65 FE) MG tablet Take 325 mg by mouth daily with breakfast.   Yes [provider]  folic acid (FOLVITE) 1 MG tablet Take by mouth. 09/09/13  Yes [provider]  guaiFENesin (MUCINEX) 600 MG 12 hr tablet Take 600 mg by mouth daily.    Yes [provider]  loratadine (CLARITIN) 10 MG tablet Take 10 mg by mouth daily.     Yes [provider]  methotrexate (RHEUMATREX) 2.5 MG tablet Take 25 mg by mouth once a week. sundays 01/09/16  Yes [provider]  montelukast (SINGULAIR) 10 MG tablet TAKE 1 TABLET BY MOUTH  EVERY  EVENING 01/29/17  Yes Young, Tarri Fuller D, MD  Multiple Vitamin (MULTIVITAMIN) capsule Take 1 capsule by mouth daily.   Yes [provider]  pantoprazole (PROTONIX) 40 MG tablet Take 40 mg by mouth every evening.    Yes [provider]  PREDNISONE PO Take 30 mg by mouth daily.  01/04/18  Yes [provider]  simvastatin (ZOCOR) 20 MG tablet Take 20 mg by mouth daily at 6 PM.    Yes [provider]  tamsulosin (FLOMAX) 0.4 MG CAPS capsule Take 2 capsules (0.8 mg total) by mouth daily after supper. 08/14/15  Yes Gaynelle Arabian, MD    Physical Exam: Vitals:   05/09/18 1650 05/09/18 1806 05/09/18 1952 05/09/18 2030  BP: 108/74 116/79 120/82 130/85  Pulse: 62 62 (!) 59 (!) 57  Resp:  16 16 16   Temp:  98.3 F (36.8 C)  TempSrc:    Oral  SpO2: 98% 98% 96% 99%  Weight:    77.1 kg  Height:    5' 10"  (1.778 m)    Constitutional: NAD, calm, comfortable Vitals:   05/09/18 1650 05/09/18 1806 05/09/18 1952 05/09/18 2030  BP: 108/74 116/79 120/82 130/85  Pulse: 62 62 (!) 59 (!) 57  Resp:  16 16 16   Temp:    98.3 F (36.8 C)  TempSrc:    Oral  SpO2: 98% 98% 96% 99%  Weight:    77.1 kg  Height:    5' 10"  (1.778 m)   Eyes: PERRL, lids and conjunctivae normal ENMT: Mucous membranes are moist. Posterior pharynx clear of any exudate or lesions.Normal dentition.  Neck: normal, supple, no masses, no thyromegaly Respiratory: faint bibasilar crackles. Normal respiratory effort. No accessory muscle use.  Cardiovascular: Regular rate and rhythm, no murmurs / rubs / gallops.  Lymphedema left leg.  2+ pedal pulses. Abdomen: no tenderness, no masses palpated. No hepatosplenomegaly. Bowel sounds positive.  Musculoskeletal: no clubbing / cyanosis. No joint deformity upper and lower extremities. Good ROM, no contractures. Normal muscle tone.  Skin: no rashes, lesions, ulcers. No induration Neurologic: CN 2-12 grossly intact. Sensation intact, DTR normal. Strength 5/5 in all  4.  Psychiatric: Normal judgment and insight. Alert and oriented x 3. Normal mood.   Labs on Admission: I have personally reviewed following labs and imaging studies  CBC: Recent Labs  Lab 05/09/18 1159  WBC 5.7  HGB 13.9  HCT 42.1  MCV 92.3  PLT 158   Basic Metabolic Panel: Recent Labs  Lab 05/09/18 1159  NA 135  K 4.1  CL 104  CO2 21*  GLUCOSE 88  BUN 19  CREATININE 1.12  CALCIUM 8.9   GFR: Estimated Creatinine Clearance: 57 mL/min (by C-G formula based on SCr of 1.12 mg/dL). Liver Function Tests: Recent Labs  Lab 05/09/18 1159  AST 22  ALT 27  ALKPHOS 59  BILITOT 0.7  PROT 5.7*  ALBUMIN 3.1*   Recent Labs  Lab 05/09/18 1159  LIPASE 21   No results for input(s): AMMONIA in the last 168 hours. Coagulation Profile: No results for input(s): INR, PROTIME in the last 168 hours. Cardiac Enzymes: No results for input(s): CKTOTAL, CKMB, CKMBINDEX, TROPONINI in the last 168 hours. BNP (last 3 results) No results for input(s): PROBNP in the last 8760 hours. HbA1C: No results for input(s): HGBA1C in the last 72 hours. CBG: No results for input(s): GLUCAP in the last 168 hours. Lipid Profile: No results for input(s): CHOL, HDL, LDLCALC, TRIG, CHOLHDL, LDLDIRECT in the last 72 hours. Thyroid Function Tests: No results for input(s): TSH, T4TOTAL, FREET4, T3FREE, THYROIDAB in the last 72 hours. Anemia Panel: No results for input(s): VITAMINB12, FOLATE, FERRITIN, TIBC, IRON, RETICCTPCT in the last 72 hours. Urine analysis:    Component Value Date/Time   COLORURINE STRAW (A) 05/09/2018 1507   APPEARANCEUR CLEAR 05/09/2018 1507   LABSPEC 1.003 (L) 05/09/2018 1507   PHURINE 7.0 05/09/2018 1507   GLUCOSEU NEGATIVE 05/09/2018 1507   HGBUR NEGATIVE 05/09/2018 1507   BILIRUBINUR NEGATIVE 05/09/2018 1507   BILIRUBINUR neg 03/13/2012 0921   KETONESUR NEGATIVE 05/09/2018 1507   PROTEINUR NEGATIVE 05/09/2018 1507   UROBILINOGEN 0.2 02/05/2014 1107   NITRITE  NEGATIVE 05/09/2018 1507   LEUKOCYTESUR NEGATIVE 05/09/2018 1507    Radiological Exams on Admission: Dg Abdomen 1 View  Result Date: 05/09/2018 CLINICAL DATA:  Recent colitis, abdominal pain  and diarrhea, status post flexible sigmoidoscopy EXAM: ABDOMEN - 1 VIEW COMPARISON:  10/25/2017 FINDINGS: Mild cardiomegaly. Minor basilar atelectasis versus scarring. Nonobstructive bowel gas pattern. Normal stool burden in the right colon. 5 mm calculus in the right abdomen may represent nephrolithiasis, unchanged in position. Pelvic calcifications likely vascular. Bones are osteopenic. No acute finding by plain radiography. IMPRESSION: Nonobstructive bowel gas pattern. No acute finding or interval change Electronically Signed   By: Jerilynn Mages.  Shick M.D.   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 male with medical history significant of ulcerative colitis, asthma, hyperlipidemia, and recent Pseudomonas pneumonia presented to [facility] ED directly from his GI office with fever, diarrhea, and crampy abdominal pain.  Patient underwent sigmoidoscopy by Dr. Cristina Gong with [facility] on 05/08/2018.  Procedure essentially became a colonoscopy and showed diffuse colitis extending to the ascending colon.   Ulcerative Colitis: Per Dr. Osborn Coho office notes, lower endoscopy showed pancolitis.  Testing for C. difficile is negative so we will discontinue metronidazole and treat for ulcerative colitis with IV Solu-Medrol.  ED physician paged GI to ensure following for tomorrow. -IV Solu-Medrol 20 mg every 8 hours -NS @ 100 mL/hr x 12 hours -NPO for now -Continue home Methotrexate 2.5 mg weekly (takes sundays) -f/u GI recommendations  Asthma/Chronic Bronchitis: Currently stable.  Continue Dulera, Singulair, PRN albuterol,  Claritin.  Hyperlipidemia: Continue home Simvastatin.  DVT prophylaxis: Lovenox  Code Status: Full  Family Communication: None at bedside Disposition Plan: Likely DC to home pending clinical progress Consults called: GI (attempted on-call GI, ? Dr. Cristina Gong to see in a.m.) Admission status: Inpatient, medical floor  Zada Finders MD [facility] Pager [phone]  If 7PM-7AM, please contact night-coverage [email or web address] Password [email or web address]  05/09/2018, 11:12 PM

## 2018-05-09 NOTE — ED Triage Notes (Signed)
Dr. Cristina Gong sending over patient for active ulcerative colitis. May call MD for further information: [phone].

## 2018-05-09 NOTE — ED Provider Notes (Signed)
[place] EMERGENCY DEPARTMENT Provider Note   CSN: [id number] Arrival date & time: 05/09/18  1134     History   Chief Complaint Chief Complaint  Patient presents with  . Abdominal Pain    HPI ANNA LIVERS is a 77 y.o. male with medical history significant for left-sided ulcerative colitis, COPD, pneumonia secondary to pseudomonas aeruginosa presenting here for evaluation of ulcerative colitis exacerbation after he was seen by his PCP this morning.  Patient reports that for the last month he has had worsening diarrhea and hematochezia.  He indicates that he can have up to 12 loose stools in a day.  He endorses chills, lightheadedness and some generalized weakness but denies fevers, nausea, vomiting, palpitation, chest pain, shortness of breath or abdominal pain.   During his visit to his PCP this morning, he was noted to be febrile at 102.4 and tachycardic to 103.  Of note, he underwent flexible sigmoidoscopy yesterday 05/08/2018 and this showed pancolitis which is worse from previous endoscopic findings.  On chart review, he received 4 weeks of antibiotic infusion for pseudomonas aeruginosa pneumonia via PICC line.   Past Medical History:  Diagnosis Date  . Asthma   . Chronic bronchitis   . COPD (chronic obstructive pulmonary disease) ([place])   . Hearing loss in left ear   . Peripheral edema   . Sinusitis   . Ulcerative colitis     Patient Active Problem List   Diagnosis Date Noted  . OA (osteoarthritis) of knee 08/13/2015  . Chronic bronchitis with acute exacerbation ([place]) 09/23/2013  . Atherosclerosis of native arteries of the extremities with intermittent claudication 06/18/2012  . Pain in limb 06/18/2012  . Claudication ([place]) 05/30/2012  . Frequency of urination 03/13/2012  . General medical examination 05/29/2011  . Myalgia 04/04/2011  . DERMATOPHYTOSIS OF NAIL 09/22/2010  . INGUINAL HERNIA, LEFT 09/22/2010  . DECREASED HEARING 12/23/2009  .  RHINITIS 12/23/2009  . UNSPECIFIED DISORDER OF SKIN&SUBCUTANEOUS TISSUE 12/23/2009  . HYPERLIPIDEMIA 09/13/2009  . LYMPHEDEMA 08/27/2009  . ELEVATED BLOOD PRESSURE WITHOUT DIAGNOSIS OF HYPERTENSION 08/27/2009  . BENIGN PROSTATIC HYPERTROPHY, MILD, HX OF 08/27/2009  . OTHER CHRONIC SINUSITIS 08/09/2007  . BRONCHITIS, CHRONIC 08/09/2007  . HIATAL HERNIA 08/09/2007  . Ulcerative colitis ([place]) 08/09/2007    Past Surgical History:  Procedure Laterality Date  . colonscopy     several with polys removed  . HERNIA REPAIR  2013   x 3 hernias repaired  . ROTATOR CUFF REPAIR     bilateral shoulders   . TOTAL KNEE ARTHROPLASTY Right 08/13/2015   Procedure: RIGHT TOTAL KNEE ARTHROPLASTY;  Surgeon: Gaynelle Arabian, MD;  Location: [facility];  Service: Orthopedics;  Laterality: Right;        Home Medications    Prior to Admission medications   Medication Sig Start Date End Date Taking? Authorizing Provider  acetaminophen (TYLENOL) 500 MG tablet Take 1,000 mg by mouth every 8 (eight) hours as needed for moderate pain.     [provider]  ADVAIR DISKUS 100-50 MCG/DOSE AEPB USE 1 INHALATION INTO THE  LUNGS 2 TIMES DAILY 07/10/16   Baird Lyons D, MD  albuterol (PROVENTIL HFA;VENTOLIN HFA) 108 (90 Base) MCG/ACT inhaler Inhale 2 puffs into the lungs every 4 (four) hours as needed for wheezing or shortness of breath. 02/25/16   Deneise Lever, MD  aspirin 81 MG chewable tablet Chew 81 mg by mouth at bedtime.    [provider]  Calcium Carbonate-Vitamin D (CALCIUM 600/VITAMIN  D) 600-400 MG-UNIT chew tablet Chew 1 tablet by mouth daily.    [provider]  Co-Enzyme Q-10 30 MG CAPS Take 30 mg by mouth 3 (three) times daily.    [provider]  diphenoxylate-atropine (LOMOTIL) 2.5-0.025 MG tablet Take 1 tablet by mouth 4 (four) times daily.    [provider]  ferrous sulfate 325 (65 FE) MG tablet Take 325 mg by mouth daily with breakfast.    [provider]  folic acid (FOLVITE) 1 MG tablet Take by mouth. 09/09/13   [provider]  glucosamine-chondroitin 500-400 MG tablet Take 1 tablet by mouth 3 (three) times daily.    [provider]  guaiFENesin (MUCINEX) 600 MG 12 hr tablet Take 600 mg by mouth daily.     [provider]  loratadine (CLARITIN) 10 MG tablet Take 10 mg by mouth daily.      [provider]  methotrexate (RHEUMATREX) 2.5 MG tablet  01/09/16   [provider]  montelukast (SINGULAIR) 10 MG tablet TAKE 1 TABLET BY MOUTH  EVERY EVENING 01/29/17   Baird Lyons D, MD  Multiple Vitamin (MULTIVITAMIN) capsule Take 1 capsule by mouth daily.    [provider]  pantoprazole (PROTONIX) 40 MG tablet Take 40 mg by mouth every evening.     [provider]  predniSONE (DELTASONE) 1 MG tablet Take 3 mg by mouth daily. 01/04/18   [provider]  simvastatin (ZOCOR) 20 MG tablet Take 20 mg by mouth daily at 6 PM.     [provider]  tamsulosin (FLOMAX) 0.4 MG CAPS capsule Take 2 capsules (0.8 mg total) by mouth daily after supper. 08/14/15   Gaynelle Arabian, MD    Family History Family History  Problem Relation Age of Onset  . Heart disease Mother        MI  . Hyperlipidemia Mother   . Other Mother        varicose veins  . Heart attack Mother   . COPD Sister   . Hyperlipidemia Father   . Hypertension Father     Social History Social History   Tobacco Use  . Smoking status: Former Smoker    Packs/day: 2.00    Years: 15.00    Pack years: 30.00    Types: Cigarettes    Last attempt to quit: 08/08/1971    Years since quitting: 46.7  . Smokeless tobacco: Former Systems developer    Quit date: 08/08/1971  Substance Use Topics  . Alcohol use: No  . Drug use: No     Allergies   Lipitor [atorvastatin calcium]; Moxifloxacin; Penicillins; Ciprofloxacin; and Molds & smuts   Review of Systems Review of Systems  Constitutional: Positive for chills and  fever.  Respiratory: Negative.   Cardiovascular: Negative.   Gastrointestinal: Positive for blood in stool and diarrhea. Negative for abdominal distention, abdominal pain, anal bleeding, constipation, nausea, rectal pain and vomiting.  Skin: Negative.   Neurological: Positive for light-headedness.  Psychiatric/Behavioral: Negative.      Physical Exam Updated Vital Signs BP 108/63 (BP Location: Right Arm)   Pulse 75   Temp 98.6 F (37 C) (Oral)   Resp 18   Ht 5' 10"  (1.778 m)   Wt 77.1 kg   SpO2 97%   BMI 24.39 kg/m   Physical Exam  Constitutional: He appears well-developed and well-nourished.  Non-toxic appearance. He does not appear ill. No distress.  HENT:  Head: Normocephalic and atraumatic.  Eyes: No scleral icterus.  Cardiovascular: Normal rate, regular rhythm and normal heart sounds. Exam reveals no gallop and no friction rub.  No murmur heard. Pulmonary/Chest: Effort normal. He has rhonchi (diffuse). He has rales (LLL).  Abdominal: Soft. Normal appearance, normal aorta and bowel sounds are normal. He exhibits no distension. There is no tenderness.  Neurological: He is alert.  Skin: Skin is warm.  Psychiatric: He has a normal mood and affect. His behavior is normal.     ED Treatments / Results  Labs (all labs ordered are listed, but only abnormal results are displayed) Labs Reviewed  COMPREHENSIVE METABOLIC PANEL - Abnormal; Notable for the following components:      Result Value   CO2 21 (*)    Total Protein 5.7 (*)    Albumin 3.1 (*)    All other components within normal limits  URINALYSIS, ROUTINE W REFLEX MICROSCOPIC - Abnormal; Notable for the following components:   Color, Urine STRAW (*)    Specific Gravity, Urine 1.003 (*)    All other components within normal limits  LIPASE, BLOOD  CBC    EKG None  Radiology No results found.  Procedures Procedures (including critical care time)  Medications Ordered in ED Medications - No data to  display   Initial Impression / Assessment and Plan / ED Course  I have reviewed the triage vital signs and the nursing notes.  Pertinent labs & imaging results that were available during my care of the patient were reviewed by me and considered in my medical decision making (see chart for details).   77 year old pleasant gentleman with left-sided ulcerative colitis presented with a 1 month history of diarrhea and hematochezia.  Underwent flexible sigmoidoscopy on 05/08/2018 and noted to have a proximal extension of his colitis.  This morning developed fevers and chills.  Recorded temp at his PCPs office was 102.4 with heart rate of 103.   Arrival to the ED, vital signs were within normal limits.  Differential diagnosis includes exacerbation of ulcerative colitis, C. difficile, bowel perforation.  Patient will be made n.p.o., KUB was ordered and shows no free air.He will be admitted to the hospitality service for further management.   Even though he has no peritoneal signs or signs of fulminant colitis or toxic megacolon and also remains afebrile, however given that he was febrile prior to admission I believe starting antibiotic on this gentleman is quite appropriate.  I paged the on-call [facility] physician on call to make them aware that patient was being admitted.    Final Clinical Impressions(s) / ED Diagnoses   Final diagnoses:  None    ED Discharge Orders    None       Jean Rosenthal, MD 05/09/18 Milon Dikes    Jola Schmidt, MD 05/09/18 2142

## 2018-05-09 NOTE — ED Triage Notes (Signed)
States history of colitis recently and seen Dr Johnney Killian today and sent to the ED for evaluation for abdominal pain and diarrhea.

## 2018-05-09 NOTE — ED Notes (Signed)
Monitor in room is broken, will do VS via dinamap.

## 2018-05-09 NOTE — ED Notes (Signed)
Pt aware that a stool sample is needed and will provide one when he is able.

## 2018-05-10 DIAGNOSIS — K51019 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: Secondary | ICD-10-CM

## 2018-05-10 LAB — CBC
HEMATOCRIT: 40.9 % (ref 39.0–52.0)
Hemoglobin: 13.4 g/dL (ref 13.0–17.0)
MCH: 30.3 pg (ref 26.0–34.0)
MCHC: 32.8 g/dL (ref 30.0–36.0)
MCV: 92.5 fL (ref 78.0–100.0)
PLATELETS: 143 10*3/uL — AB (ref 150–400)
RBC: 4.42 MIL/uL (ref 4.22–5.81)
RDW: 15.5 % (ref 11.5–15.5)
WBC: 5.5 10*3/uL (ref 4.0–10.5)

## 2018-05-10 LAB — BASIC METABOLIC PANEL
ANION GAP: 10 (ref 5–15)
BUN: 18 mg/dL (ref 8–23)
CO2: 23 mmol/L (ref 22–32)
Calcium: 8.4 mg/dL — ABNORMAL LOW (ref 8.9–10.3)
Chloride: 104 mmol/L (ref 98–111)
Creatinine, Ser: 1.11 mg/dL (ref 0.61–1.24)
GFR calc Af Amer: >60 mL/min (ref >60)
GLUCOSE: 79 mg/dL (ref 70–99)
POTASSIUM: 3.8 mmol/L (ref 3.5–5.1)
Sodium: 137 mmol/L (ref 135–145)

## 2018-05-10 MED ORDER — BOOST / RESOURCE BREEZE PO LIQD CUSTOM
1.0000 | Freq: Three times a day (TID) | ORAL | Status: DC
  Administered 2018-05-10 – 2018-05-13 (×8): 1 via ORAL

## 2018-05-10 MED ORDER — ADULT MULTIVITAMIN W/MINERALS CH
1.0000 | ORAL_TABLET | Freq: Every day | ORAL | Status: DC
  Administered 2018-05-10 – 2018-05-13 (×4): 1 via ORAL
  Filled 2018-05-10 (×3): qty 1

## 2018-05-10 MED ORDER — TAMSULOSIN HCL 0.4 MG PO CAPS
0.8000 mg | ORAL_CAPSULE | Freq: Every day | ORAL | Status: DC
  Administered 2018-05-10 – 2018-05-12 (×3): 0.8 mg via ORAL
  Filled 2018-05-10 (×3): qty 2

## 2018-05-10 MED ORDER — ZOLPIDEM TARTRATE 5 MG PO TABS
5.0000 mg | ORAL_TABLET | Freq: Every evening | ORAL | Status: DC | PRN
  Administered 2018-05-10 – 2018-05-12 (×3): 5 mg via ORAL
  Filled 2018-05-10 (×3): qty 1

## 2018-05-10 NOTE — Progress Notes (Signed)
Initial Nutrition Assessment  DOCUMENTATION CODES:   Not applicable  INTERVENTION:   -Boost Breeze po TID, each supplement provides 250 kcal and 9 grams of protein -MVI with minerals daily -RD will follow for diet advancement and supplement as appropriate; if prolonged NPO/clear liquid diet status is anticipated, recommend initiation of nutrition support  NUTRITION DIAGNOSIS:   Inadequate oral intake related to altered GI function as evidenced by (clear liquid diet).  GOAL:   Patient will meet greater than or equal to 90% of their needs  MONITOR:   PO intake, Supplement acceptance, Diet advancement, Labs, Weight trends, Skin, I & O's  REASON FOR ASSESSMENT:   Consult Assessment of nutrition requirement/status  ASSESSMENT:   [name] is a 77 y.o. male with medical history significant of ulcerative colitis, asthma, hyperlipidemia, and recent Pseudomonas pneumonia presented to [facility] ED directly from his GI office with fever, diarrhea, and crampy abdominal pain.  Patient underwent sigmoidoscopy by Dr. Cristina Gong with [facility] on 05/08/2018.  Procedure essentially became a colonoscopy and showed diffuse colitis extending to the ascending colon.   Pt admitted with ulcerative pancolitis.   Case discussed with RN, who reports pt with no difficulties tolerating clears today. No plans to advance diet today.   Spoke with pt and wife at bedside, who reports pt was in great state of health PTA. He typically consumes 3 meals per day as well as snacks (Breakfast: grits, eggs, bacon; Lunch: sandwich and side; Dinner: meat, starch, and vegetable). Pt reports he snacks on a lot of fruit, especially watermelon, but feels he does not consume as many vegetables as he should. He confirms that he has been tolerating clear liquids well, but eager for diet advancement already. He complained of hunger during RD visit- RD provided pt with [place] ice and a Boost Breeze supplement to try.   Pt  denies any weight loss. He is very active at baseline. Per pt wife, pt was chopping down trees PTA.   Pt reports that he will likely be on clear liquid diet for a few days. He was able to discuss progression of diet advancement. He reports "for now, the doctor doesn't want me to have any fiber". Pt tried [employer] supplement and was agreeable to it. Discussed importance of good meal and supplement intake to promote healing.  Labs reviewed.   NUTRITION - FOCUSED PHYSICAL EXAM:    Most Recent Value  Orbital Region  No depletion  Upper Arm Region  No depletion  Thoracic and Lumbar Region  No depletion  Buccal Region  No depletion  Temple Region  No depletion  Clavicle Bone Region  No depletion  Clavicle and Acromion Bone Region  No depletion  Scapular Bone Region  No depletion  Dorsal Hand  No depletion  Patellar Region  No depletion  Anterior Thigh Region  No depletion  Posterior Calf Region  No depletion  Edema (RD Assessment)  None  Hair  Reviewed  Eyes  Reviewed  Mouth  Reviewed  Skin  Reviewed  Nails  Reviewed       Diet Order:   Diet Order            Diet clear liquid Room service appropriate? Yes; Fluid consistency: Thin  Diet effective now              EDUCATION NEEDS:   Education needs have been addressed  Skin:  Skin Assessment: Reviewed RN Assessment  Last BM:  05/10/18  Height:  Ht Readings from Last 1 Encounters:  05/09/18 5' 10"  (1.778 m)    Weight:   Wt Readings from Last 1 Encounters:  05/09/18 77.1 kg    Ideal Body Weight:  52.7 kg  BMI:  Body mass index is 24.39 kg/m.  Estimated Nutritional Needs:   Kcal:  2100-2300  Protein:  100-115 grams  Fluid:  2.1-2.3 L    Brigett Estell A. Jimmye Norman, RD, LDN, CDE Pager: [phone] After hours Pager: [phone]

## 2018-05-10 NOTE — Consult Note (Signed)
[facility] CONSULT Reason for consult: Ulcerative colitis poorly controlled Referring Physician: PCP: Anastasia Pall, MD.  [facility]: Dr. Herbie Baltimore Buccini  [name] is an 77 y.o. male.  HPI: He has had approximately a 50-year history of ulcerative colitis and has been on a multitude of medications.  He was an experimental program in [place] several years ago and was placed on methotrexate 25 mg weekly.  He had been doing fairly well with the methotrexate with occasional steroids.  All this is been doing quite well until he developed a Pseudomonas pneumonia and was hospitalized at [facility] in [place].  He had a PICC line inserted and received antibiotics for approximately a month followed by the ID service in [place].  Near the end of this therapy, he began to have worsening diarrhea.  This was in spite of being placed on prednisone.  He had what was initially to be a sigmoidoscopy 2 days ago by Dr. Cristina Gong but he was able to advance to the right colon.  The patient had diffuse colitis.  Multiple biopsies were taken.  He came to the office yesterday after having a temperature of 102 with home.  He was having profuse diarrhea.  His wife noted that he was weak and dizzy and could barely walk to the car.  This was in spite of oral prednisone.  For this reason it was felt he needed to be hospitalized for IV fluids, IV steroids and consideration for other therapies. The patient notes that since he has been in the hospital he feels much better. His lab work since admission shows fairly normal electrolytes, WBC 5.7.Stool test for C. difficile was all negative.  It is notable that at the time of his recent colonoscopy Dr. Cristina Gong did not see any signs of pseudomembranes.  He is receiving IV steroids.  Past Medical History:  Diagnosis Date  . Arthritis    "joints" (05/09/2018)  . Asthma   . Chronic bronchitis   . COPD (chronic obstructive pulmonary disease) ([place])   . GERD  (gastroesophageal reflux disease)   . Hearing loss in left ear   . High cholesterol   . History of hiatal hernia   . Lymphedema of left leg    "foot" (05/09/2018)  . Peripheral edema   . Pneumonia 04/2018  . Sinusitis   . Ulcerative colitis     Past Surgical History:  Procedure Laterality Date  . CATARACT EXTRACTION W/ INTRAOCULAR LENS  IMPLANT, BILATERAL Bilateral   . COLONOSCOPY W/ BIOPSIES AND POLYPECTOMY     several with polys removed  . HERNIA REPAIR  2013   x 3 hernias repaired  . JOINT REPLACEMENT    . SHOULDER OPEN ROTATOR CUFF REPAIR Bilateral   . TOTAL KNEE ARTHROPLASTY Right 08/13/2015   Procedure: RIGHT TOTAL KNEE ARTHROPLASTY;  Surgeon: Gaynelle Arabian, MD;  Location: [facility];  Service: Orthopedics;  Laterality: Right;    Family History  Problem Relation Age of Onset  . Heart disease Mother        MI  . Hyperlipidemia Mother   . Other Mother        varicose veins  . Heart attack Mother   . COPD Sister   . Hyperlipidemia Father   . Hypertension Father     Social History:  reports that he quit smoking about 46 years ago. His smoking use included cigarettes. He has a 30.00 pack-year smoking history. He quit smokeless tobacco use about 46 years ago.  His smokeless  tobacco use included chew. He reports that he does not drink alcohol or use drugs.  Allergies:  Allergies  Allergen Reactions  . Lipitor [Atorvastatin Calcium]     Muscle ache  . Moxifloxacin     REACTION: rash  . Penicillins Other (See Comments)    Patient has never had reaction, but Dr Glynn Octave told him not to take due to possible cross-sensitivity with mold allergy Has patient had a PCN reaction causing immediate rash, facial/tongue/throat swelling, SOB or lightheadedness with hypotension: No Has patient had a PCN reaction causing severe rash involving mucus membranes or skin necrosis: No Has patient had a PCN reaction that required hospitalization: No Has patient had a PCN reaction occurring within  the last 10 years: No If all of the above   . Ciprofloxacin Diarrhea  . Molds & Smuts Rash    Medications; Prior to Admission medications   Medication Sig Start Date End Date Taking? Authorizing Provider  acetaminophen (TYLENOL) 500 MG tablet Take 1,000 mg by mouth every 8 (eight) hours as needed for moderate pain.    Yes [provider]  ADVAIR DISKUS 100-50 MCG/DOSE AEPB USE 1 INHALATION INTO THE  LUNGS 2 TIMES DAILY 07/10/16  Yes Young, Clinton D, MD  albuterol (PROVENTIL HFA;VENTOLIN HFA) 108 (90 Base) MCG/ACT inhaler Inhale 2 puffs into the lungs every 4 (four) hours as needed for wheezing or shortness of breath. 02/25/16  Yes Young, Tarri Fuller D, MD  aspirin 81 MG chewable tablet Chew 81 mg by mouth at bedtime.   Yes [provider]  Calcium Carbonate-Vitamin D (CALCIUM 600/VITAMIN D) 600-400 MG-UNIT chew tablet Chew 1 tablet by mouth daily.   Yes [provider]  Co-Enzyme Q-10 30 MG CAPS Take 30 mg by mouth daily.    Yes [provider]  diphenoxylate-atropine (LOMOTIL) 2.5-0.025 MG tablet Take 2 tablets by mouth 2 (two) times daily.    Yes [provider]  ferrous sulfate 325 (65 FE) MG tablet Take 325 mg by mouth daily with breakfast.   Yes [provider]  folic acid (FOLVITE) 1 MG tablet Take by mouth. 09/09/13  Yes [provider]  guaiFENesin (MUCINEX) 600 MG 12 hr tablet Take 600 mg by mouth daily.    Yes [provider]  loratadine (CLARITIN) 10 MG tablet Take 10 mg by mouth daily.     Yes [provider]  methotrexate (RHEUMATREX) 2.5 MG tablet Take 25 mg by mouth once a week. sundays 01/09/16  Yes [provider]  montelukast (SINGULAIR) 10 MG tablet TAKE 1 TABLET BY MOUTH  EVERY EVENING 01/29/17  Yes Young, Tarri Fuller D, MD  Multiple Vitamin (MULTIVITAMIN) capsule Take 1 capsule by mouth daily.   Yes [provider]  pantoprazole (PROTONIX) 40 MG tablet Take 40 mg by mouth every evening.     Yes [provider]  PREDNISONE PO Take 30 mg by mouth daily.  01/04/18  Yes [provider]  simvastatin (ZOCOR) 20 MG tablet Take 20 mg by mouth daily at 6 PM.    Yes [provider]  tamsulosin (FLOMAX) 0.4 MG CAPS capsule Take 2 capsules (0.8 mg total) by mouth daily after supper. 08/14/15  Yes Aluisio, Pilar Plate, MD   . aspirin  81 mg Oral QHS  . enoxaparin (LOVENOX) injection  40 mg Subcutaneous Q24H  . ferrous sulfate  325 mg Oral Q breakfast  . folic acid  1 mg Oral Daily  . loratadine  10 mg Oral Daily  . [  START ON 05/12/2018] methotrexate  25 mg Oral Weekly  . methylPREDNISolone (SOLU-MEDROL) injection  20 mg Intravenous Q8H  . mometasone-formoterol  2 puff Inhalation BID  . montelukast  10 mg Oral QPM  . pantoprazole  40 mg Oral QPM  . simvastatin  20 mg Oral q1800   PRN Meds acetaminophen **OR** acetaminophen, albuterol, alum & mag hydroxide-simeth Results for orders placed or performed during the hospital encounter of 05/09/18 (from the past 48 hour(s))  Lipase, blood     Status: None   Collection Time: 05/09/18 11:59 AM  Result Value Ref Range   Lipase 21 11 - 51 U/L    Comment: Performed at [facility], [place] [address] [place], [place] [address]  Comprehensive metabolic panel     Status: Abnormal   Collection Time: 05/09/18 11:59 AM  Result Value Ref Range   Sodium 135 135 - 145 mmol/L   Potassium 4.1 3.5 - 5.1 mmol/L   Chloride 104 98 - 111 mmol/L   CO2 21 (L) 22 - 32 mmol/L   Glucose, Bld 88 70 - 99 mg/dL   BUN 19 8 - 23 mg/dL   Creatinine, Ser 1.12 0.61 - 1.24 mg/dL   Calcium 8.9 8.9 - 10.3 mg/dL   Total Protein 5.7 (L) 6.5 - 8.1 g/dL   Albumin 3.1 (L) 3.5 - 5.0 g/dL   AST 22 15 - 41 U/L   ALT 27 0 - 44 U/L   Alkaline Phosphatase 59 38 - 126 U/L   Total Bilirubin 0.7 0.3 - 1.2 mg/dL   GFR calc non Af Amer >60 >60 mL/min   GFR calc Af Amer >60 >60 mL/min    Comment: (NOTE) The eGFR has been calculated using the CKD EPI  equation. This calculation has not been validated in all clinical situations. eGFR's persistently <60 mL/min signify possible Chronic Kidney Disease.    Anion gap 10 5 - 15    Comment: Performed at [facility] [address] [place] [address]  CBC     Status: None   Collection Time: 05/09/18 11:59 AM  Result Value Ref Range   WBC 5.7 4.0 - 10.5 K/uL   RBC 4.56 4.22 - 5.81 MIL/uL   Hemoglobin 13.9 13.0 - 17.0 g/dL   HCT 42.1 39.0 - 52.0 %   MCV 92.3 78.0 - 100.0 fL   MCH 30.5 26.0 - 34.0 pg   MCHC 33.0 30.0 - 36.0 g/dL   RDW 15.3 11.5 - 15.5 %   Platelets 203 150 - 400 K/uL    Comment: Performed at [facility], [place] [address], [place], [place] [address]  Urinalysis, Routine w reflex microscopic     Status: Abnormal   Collection Time: 05/09/18  3:07 PM  Result Value Ref Range   Color, Urine STRAW (A) YELLOW   APPearance CLEAR CLEAR   Specific Gravity, Urine 1.003 (L) 1.005 - 1.030   pH 7.0 5.0 - 8.0   Glucose, UA NEGATIVE NEGATIVE mg/dL   Hgb urine dipstick NEGATIVE NEGATIVE   Bilirubin Urine NEGATIVE NEGATIVE   Ketones, ur NEGATIVE NEGATIVE mg/dL   Protein, ur NEGATIVE NEGATIVE mg/dL   Nitrite NEGATIVE NEGATIVE   Leukocytes, UA NEGATIVE NEGATIVE    Comment: Performed at [facility] [address], [place], [place] [address]  C difficile quick scan w PCR reflex     Status: None   Collection Time: 05/09/18  6:01 PM  Result Value Ref Range  C Diff antigen NEGATIVE NEGATIVE   C Diff toxin NEGATIVE NEGATIVE   C Diff interpretation No C. difficile detected.     Comment: Performed at [facility], [place] [address] [place], [place] [address]  CBC     Status: Abnormal   Collection Time: 05/10/18  5:15 AM  Result Value Ref Range   WBC 5.5 4.0 - 10.5 K/uL   RBC 4.42 4.22 - 5.81 MIL/uL   Hemoglobin 13.4 13.0 - 17.0 g/dL   HCT 40.9 39.0 - 52.0 %   MCV 92.5 78.0 - 100.0 fL   MCH 30.3 26.0 - 34.0 pg   MCHC 32.8 30.0 - 36.0 g/dL   RDW 15.5  11.5 - 15.5 %   Platelets 143 (L) 150 - 400 K/uL    Comment: Performed at [facility], [place] [address], [place], [place] [address]  Basic metabolic panel     Status: Abnormal   Collection Time: 05/10/18  5:15 AM  Result Value Ref Range   Sodium 137 135 - 145 mmol/L   Potassium 3.8 3.5 - 5.1 mmol/L   Chloride 104 98 - 111 mmol/L   CO2 23 22 - 32 mmol/L   Glucose, Bld 79 70 - 99 mg/dL   BUN 18 8 - 23 mg/dL   Creatinine, Ser 1.11 0.61 - 1.24 mg/dL   Calcium 8.4 (L) 8.9 - 10.3 mg/dL   GFR calc non Af Amer >60 >60 mL/min   GFR calc Af Amer >60 >60 mL/min    Comment: (NOTE) The eGFR has been calculated using the CKD EPI equation. This calculation has not been validated in all clinical situations. eGFR's persistently <60 mL/min signify possible Chronic Kidney Disease.    Anion gap 10 5 - 15    Comment: Performed at [facility] [address], [place], [place] [address]    Dg Abdomen 1 View  Result Date: 05/09/2018 CLINICAL DATA:  Recent colitis, abdominal pain and diarrhea, status post flexible sigmoidoscopy EXAM: ABDOMEN - 1 VIEW COMPARISON:  10/25/2017 FINDINGS: Mild cardiomegaly. Minor basilar atelectasis versus scarring. Nonobstructive bowel gas pattern. Normal stool burden in the right colon. 5 mm calculus in the right abdomen may represent nephrolithiasis, unchanged in position. Pelvic calcifications likely vascular. Bones are osteopenic. No acute finding by plain radiography. IMPRESSION: Nonobstructive bowel gas pattern. No acute finding or interval change Electronically Signed   By: Jerilynn Mages.  Shick M.D.   On: 05/09/2018 17:15   ROS: Constitutional: Weak and dizzy as noted above with fever HEENT: Negative Cardiovascular: Negative Respiratory: Short of breath since his recent pulmonary infection GI: See HPI            Blood pressure 122/80, pulse (!) 59, temperature 98.6 F (37 C), temperature source Oral, resp. rate 16, height 5' 10" (1.778 m), weight 77.1 kg,  SpO2 97 %.  Physical exam:   General--white male lying in a hospital bed in no distress ENT--nonicteric Neck--supple Heart--regular rate and rhythm without murmurs or gallops Lungs--clear Abdomen--soft and nontender with somewhat increased bowel sounds Psych--alert and oriented mood and affect appropriate   Assessment: 1.  Ulcerative colitis.  This is been poorly controlled despite oral prednisone and methotrexate.  Probably was exacerbated by his recent antibiotic therapy.  Fortunately, C. difficile antigen is negative here in the hospital and there was no sign of pseudomembranes on recent colonoscopy.  Hopefully he will respond to bowel rest, IV steroids and slow resumption of diet.  He is 75 has other  issues and I think does need IV fluids and hospital treatment for several days. 2.  Pseudomonas pneumonia.  This was treated in [place] with 1 month of antibiotics via PICC line recently.  Toward the end of this treatment was when his ulcerative colitis began to flare.  Patient has a long history of cigarette use and has quit smoking.His lungs sound pretty good on physical exam we may need to repeat a chest x-ray make sure there is no residual pneumonia. 3.  Fever and weakness.  Probably due to flare of ulcerative colitis.  Fortunately, he is afebrile here in the hospital   Plan: 1.  Would consider repeating his chest x-ray if he has any further fevers. 2.  Continue on IV steroids for now. 3.  We will allow a clear liquid diet and will ask the dietitian to give him clear liquid supplements.  Would avoid advancing any diet with any residue until he starts improving.   Nancy Fetter 05/10/2018, 7:13 AM   This note was created using voice recognition software and minor errors may Have occurred unintentionally. Pager: [phone] If no answer or after hours call [phone]

## 2018-05-10 NOTE — Progress Notes (Signed)
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 Minor basilar atelectasis versus scarring."  will give flutter valve  Left lower extremity pitting edema -reports chronic for year ( chronic lymphedema) -will get venous doppler to r/o DVT  CKDII Cr at baseline, renal dosing meds  Mild cardiomegaly on x ray Close monitor volume status   Code Status: full  Family Communication: patient   Disposition Plan: not ready to discharge   Consultants:  [facility]  Procedures:  none  Antibiotics:  none   Objective: BP 122/80 (BP Location: Left Arm)   Pulse (!) 59   Temp 98.6 F (37 C) (Oral)   Resp 16   Ht 5' 10"  (1.778 m)   Wt 77.1 kg   SpO2 97%   BMI 24.39 kg/m   Intake/Output Summary (Last 24 hours) at 05/10/2018 0730 Last data filed at 05/10/2018 0500 Gross per 24 hour  Intake 582.91 ml  Output 1225 ml  Net -642.09 ml   Filed Weights   05/09/18 1146 05/09/18 2030  Weight: 77.1 kg 77.1 kg    Exam: Patient is examined daily including today on 05/10/2018, exams remain the same as of yesterday  except that has changed    General:  NAD  Cardiovascular: RRR  Respiratory: bibasilar crackles , no wheezing, no rhonchi  Abdomen: Soft/ND/NT, positive BS  Musculoskeletal: left lower extremity trace pitting Edema (reports chronic)  Neuro: alert, oriented   Data Reviewed: Basic Metabolic Panel: Recent Labs  Lab 05/09/18 1159 05/10/18 0515  NA 135 137  K 4.1 3.8  CL 104 104  CO2 21* 23  GLUCOSE 88 79  BUN 19 18  CREATININE 1.12 1.11  CALCIUM 8.9 8.4*   Liver Function Tests: Recent Labs  Lab 05/09/18 1159  AST 22  ALT 27  ALKPHOS 59  BILITOT 0.7  PROT 5.7*  ALBUMIN 3.1*   Recent Labs  Lab 05/09/18 1159  LIPASE 21   No results for input(s): AMMONIA in the last 168 hours. CBC: Recent Labs  Lab 05/09/18 1159 05/10/18 0515  WBC 5.7 5.5  HGB 13.9 13.4  HCT 42.1 40.9  MCV 92.3 92.5  PLT 203 143*   Cardiac Enzymes:   No results for input(s): CKTOTAL, CKMB, CKMBINDEX, TROPONINI in the last 168 hours. BNP (last 3 results) No results for input(s): BNP in the last 8760 hours.  ProBNP (last 3 results) No results for input(s): PROBNP in the last 8760 hours.  CBG: No results for input(s): GLUCAP in the last 168 hours.  Recent Results (from the past 240 hour(s))  C difficile quick scan w PCR reflex     Status: None   Collection Time: 05/09/18  6:01 PM  Result Value Ref Range Status   C Diff antigen NEGATIVE NEGATIVE Final   C Diff toxin NEGATIVE NEGATIVE Final   C Diff interpretation No C. difficile detected.  Final    Comment: Performed at [facility], [place] [address], [place], [place] [address]     Studies: Dg Abdomen 1 View  Result Date: 05/09/2018 CLINICAL DATA:  Recent colitis, abdominal pain and diarrhea, status post flexible sigmoidoscopy EXAM: ABDOMEN - 1 VIEW COMPARISON:  10/25/2017 FINDINGS: Mild cardiomegaly. Minor basilar atelectasis versus scarring. Nonobstructive bowel gas pattern. Normal stool burden in the right colon. 5 mm  calculus in the right abdomen may represent nephrolithiasis, unchanged in position. Pelvic calcifications likely vascular. Bones are osteopenic. No acute finding by plain radiography. IMPRESSION: Nonobstructive bowel gas pattern. No acute finding or interval change Electronically Signed   By: Jerilynn Mages.  Shick M.D.   On: 05/09/2018 17:15    Scheduled Meds: . aspirin  81 mg Oral QHS  . enoxaparin (LOVENOX) injection  40 mg Subcutaneous Q24H  . ferrous sulfate  325 mg Oral Q breakfast  . folic acid  1 mg Oral Daily  . loratadine  10 mg Oral Daily  . [START ON 05/12/2018] methotrexate  25 mg Oral Weekly  . methylPREDNISolone (SOLU-MEDROL) injection  20 mg Intravenous Q8H  . mometasone-formoterol  2 puff Inhalation BID  . montelukast  10 mg Oral QPM  . pantoprazole  40 mg Oral QPM  . simvastatin  20 mg Oral q1800  . tamsulosin  0.8 mg Oral QPC supper    Continuous Infusions: . sodium chloride 75 mL/hr at 05/09/18 1704  . sodium chloride 100 mL/hr at 05/10/18 0549     Time spent: 61mns I have personally reviewed and interpreted on  05/10/2018 daily labs,, imagings as discussed above under date review session and assessment and plans.  I reviewed all nursing notes, pharmacy notes, consultant notes,  vitals, pertinent old records  I have discussed plan of care as described above with RN , patient  on 05/10/2018   FFlorencia ReasonsMD, PhD  [facility] Pager [phone] If 7PM-7AM, please contact night-coverage at [email or web address], password [email or web address]10/11/2017, 7:30 AM  LOS: 1 day

## 2018-05-10 NOTE — Progress Notes (Signed)
Flutter valve given to patient. Uses one at home, no questions

## 2018-05-11 ENCOUNTER — Inpatient Hospital Stay (HOSPITAL_COMMUNITY): Payer: Medicare Other

## 2018-05-11 DIAGNOSIS — M79609 Pain in unspecified limb: Secondary | ICD-10-CM

## 2018-05-11 DIAGNOSIS — R609 Edema, unspecified: Secondary | ICD-10-CM

## 2018-05-11 LAB — CBC WITH DIFFERENTIAL/PLATELET
BAND NEUTROPHILS: 0 %
BASOS PCT: 1 %
Basophils Absolute: 0.1 10*3/uL (ref 0.0–0.1)
Blasts: 0 %
EOS PCT: 0 %
Eosinophils Absolute: 0 10*3/uL (ref 0.0–0.7)
HEMATOCRIT: 41.5 % (ref 39.0–52.0)
HEMOGLOBIN: 13.6 g/dL (ref 13.0–17.0)
Lymphocytes Relative: 8 %
Lymphs Abs: 0.5 10*3/uL — ABNORMAL LOW (ref 0.7–4.0)
MCH: 29.9 pg (ref 26.0–34.0)
MCHC: 32.8 g/dL (ref 30.0–36.0)
MCV: 91.2 fL (ref 78.0–100.0)
MONOS PCT: 8 %
Metamyelocytes Relative: 0 %
Monocytes Absolute: 0.6 10*3/uL (ref 0.1–1.0)
Myelocytes: 0 %
NEUTROS ABS: 5.8 10*3/uL (ref 1.7–7.7)
Neutrophils Relative %: 83 %
Other: 0 %
Platelets: 198 10*3/uL (ref 150–400)
Promyelocytes Relative: 0 %
RBC: 4.55 MIL/uL (ref 4.22–5.81)
RDW: 15.1 % (ref 11.5–15.5)
WBC: 7 10*3/uL (ref 4.0–10.5)
nRBC: 0 /100 WBC

## 2018-05-11 LAB — BASIC METABOLIC PANEL
Anion gap: 8 (ref 5–15)
BUN: 14 mg/dL (ref 8–23)
CO2: 24 mmol/L (ref 22–32)
Calcium: 8.9 mg/dL (ref 8.9–10.3)
Chloride: 105 mmol/L (ref 98–111)
Creatinine, Ser: 0.9 mg/dL (ref 0.61–1.24)
GFR calc Af Amer: >60 mL/min (ref >60)
GFR calc non Af Amer: >60 mL/min (ref >60)
Glucose, Bld: 118 mg/dL — ABNORMAL HIGH (ref 70–99)
Potassium: 4.4 mmol/L (ref 3.5–5.1)
Sodium: 137 mmol/L (ref 135–145)

## 2018-05-11 LAB — MAGNESIUM: Magnesium: 2.5 mg/dL — ABNORMAL HIGH (ref 1.7–2.4)

## 2018-05-11 NOTE — Progress Notes (Signed)
Preliminary notes--Left lower extremity venous duplex exam completed. Negative for DVT.  Incidental finding:  Left popliteal fossa medial aspect, there is a heterogenous ill-defined structure with internal calcificatied components with shadowing. Measuring 3.44x4.10x1.80cm.  [name] (RDMS RVT) 05/11/18 3:17 PM

## 2018-05-11 NOTE — Progress Notes (Addendum)
[name] 11:35 AM  Subjective: Patient seen and examined and case discussed with both Dr. Oletta Lamas and Dr. being his primary GI and he says he is doing better and wants to eat we discussed his medicines and is not having any blood or mucus in his bowels  Objective: Vital signs stable afebrile no acute distress abdomen is soft nontender labs okay  Assessment: Ulcerative colitis currently improved  Plan: We will advance his diet hopefully can go home soon and probably needs to discuss Biologics with his primary GI as an outpatient and would discharge on 40 mg of p.o. prednisone and follow-up with Dr. B in 1 to 2 weeks to probably begin the weaning process discussed other medicines as above  [facility]  Pager [phone] After 5PM or if no answer call [phone]

## 2018-05-11 NOTE — Progress Notes (Addendum)
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 ([place]) Active Problems:   Hyperlipidemia   Asthma  Ulcerative Colitis flares -has been going on for the last 6 weeks, after being treated for pneumonia -cdifff negative -home med methotrexate/folic acid continued -on iv steroids/ivf/bowel rest, diet advancement per GI,  -will follow GI recommendation  Patient wants to talk to a dietician regarding ulcerative colitis diet  Asthma/Chronic Bronchitis/COPD He  finished treatment  (three weeks ago) for  pseudomonas pneumonia with iv abx through picc line ( total of 4weeks iv abx)  Bibasilar crackles, he reports chronic He denies chest pain, no sob, no fever, he reports cough is chronic at baseline Low chest on kub "Mild cardiomegaly. Minor basilar atelectasis versus scarring."   flutter valve, encourage ambulation  Left lower extremity pitting edema -reports chronic for year ( chronic lymphedema) -will get venous doppler to r/o DVT  CKDII Cr at baseline, renal dosing meds  Mild cardiomegaly on x ray Close monitor volume status   Code Status: full  Family Communication: patient and wife at bedside  Disposition Plan: home when able to tolerate diet advancement, need GI clearance   Consultants:  [facility]  Procedures:  none  Antibiotics:  none   Objective: BP 111/75 (BP Location: Left Arm)   Pulse 60   Temp 97.9 F (36.6 C) (Oral)   Resp 18   Ht 5' 10"  (1.778 m)   Wt 77.1 kg   SpO2 96%   BMI 24.39 kg/m   Intake/Output Summary (Last 24 hours) at 05/11/2018 1235 Last data filed at 05/11/2018 0851 Gross per 24 hour    Intake 1493.75 ml  Output -  Net 1493.75 ml   Filed Weights   05/09/18 1146 05/09/18 2030  Weight: 77.1 kg 77.1 kg    Exam: Patient is examined daily including today on 05/11/2018, exams remain the same as of yesterday except that has changed    General:  NAD  Cardiovascular: RRR  Respiratory: bibasilar crackles , no wheezing, no rhonchi  Abdomen: Soft/ND/NT, positive BS  Musculoskeletal: left lower extremity trace pitting Edema (reports chronic)  Neuro: alert, oriented   Data Reviewed: Basic Metabolic Panel: Recent Labs  Lab 05/09/18 1159 05/10/18 0515 05/11/18 0626  NA 135 137 137  K 4.1 3.8 4.4  CL 104 104 105  CO2 21* 23 24  GLUCOSE 88 79 118*  BUN 19 18 14   CREATININE 1.12 1.11 0.90  CALCIUM 8.9 8.4* 8.9  MG  --   --  2.5*   Liver Function Tests: Recent Labs  Lab 05/09/18 1159  AST 22  ALT 27  ALKPHOS 59  BILITOT 0.7  PROT 5.7*  ALBUMIN 3.1*   Recent Labs  Lab 05/09/18 1159  LIPASE 21   No results for input(s): AMMONIA in the last 168 hours. CBC: Recent Labs  Lab 05/09/18 1159 05/10/18 0515 05/11/18 0626  WBC 5.7 5.5 7.0  NEUTROABS  --   --  5.8  HGB 13.9 13.4 13.6  HCT 42.1 40.9 41.5  MCV 92.3 92.5 91.2  PLT 203 143* 198   Cardiac Enzymes:   No results for input(s): CKTOTAL, CKMB, CKMBINDEX, TROPONINI in the last 168  hours. BNP (last 3 results) No results for input(s): BNP in the last 8760 hours.  ProBNP (last 3 results) No results for input(s): PROBNP in the last 8760 hours.  CBG: No results for input(s): GLUCAP in the last 168 hours.  Recent Results (from the past 240 hour(s))  C difficile quick scan w PCR reflex     Status: None   Collection Time: 05/09/18  6:01 PM  Result Value Ref Range Status   C Diff antigen NEGATIVE NEGATIVE Final   C Diff toxin NEGATIVE NEGATIVE Final   C Diff interpretation No C. difficile detected.  Final    Comment: Performed at [facility], [place] [address] [place], [place] [address]      Studies: No results found.  Scheduled Meds: . aspirin  81 mg Oral QHS  . enoxaparin (LOVENOX) injection  40 mg Subcutaneous Q24H  . feeding supplement  1 Container Oral TID BM  . ferrous sulfate  325 mg Oral Q breakfast  . folic acid  1 mg Oral Daily  . loratadine  10 mg Oral Daily  . [START ON 05/12/2018] methotrexate  25 mg Oral Weekly  . methylPREDNISolone (SOLU-MEDROL) injection  20 mg Intravenous Q8H  . mometasone-formoterol  2 puff Inhalation BID  . montelukast  10 mg Oral QPM  . multivitamin with minerals  1 tablet Oral Daily  . pantoprazole  40 mg Oral QPM  . simvastatin  20 mg Oral q1800  . tamsulosin  0.8 mg Oral QPC supper    Continuous Infusions:    Time spent: 2mns, case discussed with GI Dr MWatt Climesover the phone. I have personally reviewed and interpreted on  05/11/2018 daily labs,, imagings as discussed above under date review session and assessment and plans.  I reviewed all nursing notes, pharmacy notes, consultant notes,  vitals, pertinent old records  I have discussed plan of care as described above with RN , patient  And wife on 05/11/2018   FFlorencia ReasonsMD, PhD  [facility] Pager [phone] If 7PM-7AM, please contact night-coverage at [email or web address], password [email or web address] [facility]10/12/2017, 12:35 PM  LOS: 2 days

## 2018-05-11 NOTE — Progress Notes (Signed)
Nutrition Education Note  RD consulted for nutrition education regarding nutrition management for Ulcerative Colitis.    RD provided handout titiled"Inflammatory Bowel Disease (IBD): Crohn's Disease and Ulcerative Colitis Nutrition Therapy"  Noted that when UC in remission, his diet more or less is unrestricted. However, during flares, he should avoid fibrous items (vegeteables/fibers), tough meats, gas forming foods and non-hydrating beverages.   Vegetable wise, his foods should be well cooked and "fork tender". They should not "snap". He also should avoid gas forming foods such as cooked greens and legumes.   The meats he eats should be well cooked and non-fibrous. He should avoid steaks/porkchops and choose softer meats-ground beef or fish. He should try to eat well greasy foods as well. He says the only real greasy item he eats is sausage.   For grains, he should eat white products right now and avoid eating whole grain rice, noodles or bread.   For beverages, he should avoid high sugar/caffeine items and focus on hydrating beverages because he is at an increased risk for dehydration given his diarrhea.   Answered patient questions.    No further nutrition interventions warranted at this time.  If additional nutrition issues arise, please re-consult RD.  [name] RD, LDN, CNSC Clinical Nutrition Available Tues-Sat via Pager: [phone] 05/11/2018 2:37 PM

## 2018-05-11 NOTE — Plan of Care (Signed)
  Problem: Education: Goal: Knowledge of General Education information will improve Description Including pain rating scale, medication(s)/side effects and non-pharmacologic comfort measures Outcome: Progressing   Problem: Education: Goal: Knowledge of General Education information will improve Description Including pain rating scale, medication(s)/side effects and non-pharmacologic comfort measures Outcome: Progressing   Problem: Health Behavior/Discharge Planning: Goal: Ability to manage health-related needs will improve Outcome: Progressing   Problem: Clinical Measurements: Goal: Ability to maintain clinical measurements within normal limits will improve Outcome: Progressing

## 2018-05-12 NOTE — Progress Notes (Signed)
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 ([place]) Active Problems:   Hyperlipidemia   Asthma  Ulcerative Colitis flares -has been going on for the last 6 weeks, after being treated for pneumonia -cdifff negative -home med methotrexate/folic acid continued -on iv steroids/ivf/bowel rest, diet advancement per GI,  -will follow GI recommendation  Dietician input appreciated  Asthma/Chronic Bronchitis/COPD He  finished treatment  (three weeks ago) for  pseudomonas pneumonia with iv abx through picc line ( total of 4weeks iv abx)  Bibasilar crackles, he reports chronic He denies chest pain, no sob, no fever, he reports cough is chronic at baseline Low chest on kub "Mild cardiomegaly. Minor basilar atelectasis versus scarring."   flutter valve, encourage ambulation  Left lower extremity pitting edema -reports chronic for year ( chronic lymphedema) -venous doppler negative for  DVT  there is a heterogenous ill-defined structure with internal calcificatied components with shadowing. Measuring 3.44x4.10x1.80cm F/u with pcp  CKDII Cr at baseline, renal dosing meds  Mild cardiomegaly on x ray Close monitor volume status   Code Status: full  Family Communication: patient and wife at bedside  Disposition Plan: home when able to tolerate diet advancement, need GI clearance   Consultants:  [facility]  Procedures:  none  Antibiotics:  none   Objective: BP 124/81 (BP Location: Left Arm)   Pulse (!) 57   Temp 97.7 F (36.5 C) (Oral)   Resp 18   Ht 5' 10"  (1.778 m)   Wt 77.1 kg   SpO2 96%   BMI 24.39  kg/m   Intake/Output Summary (Last 24 hours) at 05/12/2018 1244 Last data filed at 05/12/2018 1151 Gross per 24 hour  Intake 480 ml  Output -  Net 480 ml   Filed Weights   05/09/18 1146 05/09/18 2030  Weight: 77.1 kg 77.1 kg    Exam: Patient is examined daily including today on 05/12/2018, exams remain the same as of yesterday except that has changed    General:  NAD  Cardiovascular: RRR  Respiratory: bibasilar crackles , no wheezing, no rhonchi  Abdomen: Soft/ND/NT, positive BS  Musculoskeletal: left lower extremity trace pitting Edema (reports chronic)  Neuro: alert, oriented   Data Reviewed: Basic Metabolic Panel: Recent Labs  Lab 05/09/18 1159 05/10/18 0515 05/11/18 0626  NA 135 137 137  K 4.1 3.8 4.4  CL 104 104 105  CO2 21* 23 24  GLUCOSE 88 79 118*  BUN 19 18 14   CREATININE 1.12 1.11 0.90  CALCIUM 8.9 8.4* 8.9  MG  --   --  2.5*   Liver Function Tests: Recent Labs  Lab 05/09/18 1159  AST 22  ALT 27  ALKPHOS 59  BILITOT 0.7  PROT 5.7*  ALBUMIN 3.1*   Recent Labs  Lab 05/09/18 1159  LIPASE 21   No results for input(s): AMMONIA in the last 168 hours. CBC: Recent Labs  Lab 05/09/18 1159 05/10/18 0515 05/11/18 0626  WBC 5.7 5.5 7.0  NEUTROABS  --   --  5.8  HGB 13.9 13.4 13.6  HCT 42.1 40.9 41.5  MCV 92.3 92.5 91.2  PLT 203 143* 198  Cardiac Enzymes:   No results for input(s): CKTOTAL, CKMB, CKMBINDEX, TROPONINI in the last 168 hours. BNP (last 3 results) No results for input(s): BNP in the last 8760 hours.  ProBNP (last 3 results) No results for input(s): PROBNP in the last 8760 hours.  CBG: No results for input(s): GLUCAP in the last 168 hours.  Recent Results (from the past 240 hour(s))  C difficile quick scan w PCR reflex     Status: None   Collection Time: 05/09/18  6:01 PM  Result Value Ref Range Status   C Diff antigen NEGATIVE NEGATIVE Final   C Diff toxin NEGATIVE NEGATIVE Final   C Diff interpretation No C.  difficile detected.  Final    Comment: Performed at [facility], [place] [address] [place], [place] [address]     Studies: No results found.  Scheduled Meds: . aspirin  81 mg Oral QHS  . enoxaparin (LOVENOX) injection  40 mg Subcutaneous Q24H  . feeding supplement  1 Container Oral TID BM  . ferrous sulfate  325 mg Oral Q breakfast  . folic acid  1 mg Oral Daily  . loratadine  10 mg Oral Daily  . methotrexate  25 mg Oral Weekly  . methylPREDNISolone (SOLU-MEDROL) injection  20 mg Intravenous Q8H  . mometasone-formoterol  2 puff Inhalation BID  . montelukast  10 mg Oral QPM  . multivitamin with minerals  1 tablet Oral Daily  . pantoprazole  40 mg Oral QPM  . simvastatin  20 mg Oral q1800  . tamsulosin  0.8 mg Oral QPC supper    Continuous Infusions:    Time spent: 60mns,  I have personally reviewed and interpreted on  05/12/2018 daily labs,, imagings as discussed above under date review session and assessment and plans.  I reviewed all nursing notes, pharmacy notes, consultant notes,  vitals, pertinent old records  I have discussed plan of care as described above with RN , patient  And wife on 05/12/2018   FFlorencia ReasonsMD, PhD  [facility] Pager [phone] If 7PM-7AM, please contact night-coverage at [email or web address], password TMedical [facility] Inc10/01/2018, 12:44 PM  LOS: 3 days

## 2018-05-12 NOTE — Progress Notes (Signed)
Renn Beaulah Dinning 9:45 AM  Subjective: Patient had a little blood in his bowels and occasional watery bowel movement but otherwise is doing fine discussed possible medicine options in the future including Humira or Remicade he has no new complaints Case discussed yesterday with hospital  Objective: Vital signs stable afebrile no acute distress abdomen is soft nontender no new labs  Assessment: Ulcerative colitis  Plan: I would watch him today.  If he has a good day eating and bowel movement wise he could probably go home this evening or watch him 1 more day and reevaluate tomorrow and he needs to follow-up with his primary GI to discuss above medicines while continuing his steroid  At higher dose  [facility]  Pager [phone] After 5PM or if no answer call [phone]

## 2018-05-13 LAB — CBC
HCT: 47 % (ref 39.0–52.0)
HEMOGLOBIN: 15.4 g/dL (ref 13.0–17.0)
MCH: 30.3 pg (ref 26.0–34.0)
MCHC: 32.8 g/dL (ref 30.0–36.0)
MCV: 92.3 fL (ref 78.0–100.0)
PLATELETS: 266 10*3/uL (ref 150–400)
RBC: 5.09 MIL/uL (ref 4.22–5.81)
RDW: 14.9 % (ref 11.5–15.5)
WBC: 13 10*3/uL — ABNORMAL HIGH (ref 4.0–10.5)

## 2018-05-13 LAB — BASIC METABOLIC PANEL
Anion gap: 8 (ref 5–15)
BUN: 20 mg/dL (ref 8–23)
CALCIUM: 9.1 mg/dL (ref 8.9–10.3)
CHLORIDE: 102 mmol/L (ref 98–111)
CO2: 25 mmol/L (ref 22–32)
CREATININE: 1 mg/dL (ref 0.61–1.24)
Glucose, Bld: 108 mg/dL — ABNORMAL HIGH (ref 70–99)
Potassium: 4.2 mmol/L (ref 3.5–5.1)
SODIUM: 135 mmol/L (ref 135–145)

## 2018-05-13 LAB — MAGNESIUM: MAGNESIUM: 2.3 mg/dL (ref 1.7–2.4)

## 2018-05-13 MED ORDER — PREDNISONE 10 MG PO TABS: ORAL_TABLET | ORAL | 0 refills | Status: DC

## 2018-05-13 NOTE — Discharge Summary (Signed)
Discharge Summary  [name] QMG:[id number] DOB: Nov 05, 1940  PCP: Chesley Noon, MD  Admit date: 05/09/2018 Discharge date: 05/13/2018  Time spent: 48mns  Recommendations for Outpatient Follow-up:  1. F/u with PMD within a week  for hospital discharge follow up, repeat cbc/bmp at follow up 2. F/u with GI Dr BCristina Gongin one week 3. F/u with [facility] as already scheduled  Discharge Diagnoses:  Active Hospital Problems   Diagnosis Date Noted  . 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 Asthma 05/09/2018  . Hyperlipidemia 09/13/2009    Resolved Hospital Problems  No resolved problems to display.    Discharge Condition: stable  Diet recommendation: soft diet  Filed Weights   05/09/18 1146 05/09/18 2030  Weight: 77.1 kg 77.1 kg    History of present illness: (per admitting MD Dr PPosey Pronto MYKD:[id number]: 3December 12, 1942DOA: 05/09/2018  PCP: BChesley Noon MD  Patient coming from: GI office (Dr. BCristina Gong  I have personally briefly reviewed patient's old medical records in [place] Chief Complaint: Ulcerative colitis  HPI: [name] MCENTEEis a 77y.o. male with medical history significant of ulcerative colitis, asthma, hyperlipidemia, and recent Pseudomonas pneumonia [facility] directly from his GI office with fever, diarrhea, and crampy abdominal pain.  Patient underwent sigmoidoscopy by Dr. BCristina Gongwith [facility] on 05/08/2018.  Procedure essentially became a colonoscopy and showed diffuse colitis extending to the ascending colon.    Patient followed up with GI today (05/09/2018) and was noted to have a fever of 102F at the office.  Patient states that he recently completed long-term antibiotic therapy via a PICC line for his Pseudomonas pneumonia.  After completion, he began to have frequent bowel movements consisting of loose mucoid stool with occasional blood.  He had  associated cramping pain during bowel movements.  Frequency would be up to 12 movements per day.  He reports his stool was extremely malodorous.  He has had recent chills and occasional lightheadedness.  Reports poor appetite recently.  He denied any subjective fevers, diaphoresis, chest pain, dyspnea.  Given his lower endoscopy results of pancolitis, diarrhea, and fevers he was sent to the emergency department for evaluation and treatment of potential ulcerative colitis flare versus infection.  ED Course:  In the ED vital signs were stable.  CBC and CMP were within normal limits aside from decreased total protein of 5.7, albumin 3.1.  Abdominal plain film showing nonobstructive bowel gas pattern without free air.  He was started on metronidazole for potential C. difficile.  Stool was collected and labs resulted negative for C. difficile.  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) Active Problems:   Hyperlipidemia   Asthma  Ulcerative Colitis flares -has been going on for the last 6 weeks, after being treated for pneumonia -cdifff negative -home med methotrexate/folic acid continued -he received IV steroids/ivf/bowel rest -still has diarrhea but Slowed down and tolerate soft diet -GI recommend d/c on prednsione 447mdaily and follow up with Dr buCristina Gongo discuss biologic agent , diet advancement per GI,  -Dietician input appreciated  Asthma/Chronic Bronchitis/COPD -He  finished treatment  (three weeks ago) for  pseudomonas pneumonia with iv abx through picc line ( total of 4weeks iv abx)  -Bibasilar crackles, he reports chronic -He denies chest pain, no sob, no fever, he reports cough is chronic at baseline -Low chest on kub "Mild cardiomegaly. Minor basilar atelectasis versus  scarring."  - flutter valve, encourage ambulation -f/u with [facility] as already scheduled  Left lower extremity  pitting edema -reports chronic for years ( reports chronic lymphedema) -venous doppler negative for  DVT  there is a heterogenous ill-defined structure with internal calcificatied components with shadowing. Measuring 3.44x4.10x1.80cm F/u with pcp  CKDII Cr at baseline, renal dosing meds  Mild cardiomegaly on x ray volume status stable F/u with pcp   Code Status: full  Family Communication: patient   Disposition Plan: home with close gi follow up   Consultants:  [facility]  Procedures:  none  Antibiotics:  none    Discharge Exam: BP (!) 121/92 (BP Location: Left Arm)   Pulse (!) 56   Temp (!) 97.4 F (36.3 C) (Oral)   Resp 16   Ht 5' 10"  (1.778 m)   Wt 77.1 kg   SpO2 98%   BMI 24.39 kg/m    General:  NAD  Cardiovascular: RRR  Respiratory: bibasilar crackles , no wheezing, no rhonchi  Abdomen: Soft/ND/NT, positive BS  Musculoskeletal: left lower extremity trace pitting Edema (reports chronic)  Neuro: alert, oriented    Discharge Instructions You were cared for by a hospitalist during your hospital stay. If you have any questions about your discharge medications or the care you received while you were in the hospital after you are discharged, you can call the unit and asked to speak with the hospitalist on call if the hospitalist that took care of you is not available. Once you are discharged, your primary care physician will handle any further medical issues. Please note that NO REFILLS for any discharge medications will be authorized once you are discharged, as it is imperative that you return to your primary care physician (or establish a relationship with a primary care physician if you do not have one) for your aftercare needs so that they can reassess your need for medications and monitor your lab values.  Discharge Instructions    Diet general   Complete by:  As directed    Soft diet   Increase activity slowly   Complete by:  As  directed      Allergies as of 05/13/2018      Reactions   Lipitor [atorvastatin Calcium]    Muscle ache   Moxifloxacin    REACTION: rash   Penicillins Other (See Comments)   Patient has never had reaction, but Dr Glynn Octave told him not to take due to possible cross-sensitivity with mold allergy Has patient had a PCN reaction causing immediate rash, facial/tongue/throat swelling, SOB or lightheadedness with hypotension: No Has patient had a PCN reaction causing severe rash involving mucus membranes or skin necrosis: No Has patient had a PCN reaction that required hospitalization: No Has patient had a PCN reaction occurring within the last 10 years: No If all of the above    Ciprofloxacin Diarrhea   Molds & Smuts Rash      Medication List    TAKE these medications   ADVAIR DISKUS 100-50 MCG/DOSE Aepb Generic drug:  Fluticasone-Salmeterol USE 1 INHALATION INTO THE  LUNGS 2 TIMES DAILY   albuterol 108 (90 Base) MCG/ACT inhaler Commonly known as:  PROVENTIL HFA;VENTOLIN HFA Inhale 2 puffs into the lungs every 4 (four) hours as needed for wheezing or shortness of breath.   aspirin 81 MG chewable tablet Chew 81 mg by mouth at bedtime.   CALCIUM 600/VITAMIN D 600-400 MG-UNIT chew tablet Generic drug:  Calcium Carbonate-Vitamin D Chew 1  tablet by mouth daily.   CLARITIN 10 MG tablet Generic drug:  loratadine Take 10 mg by mouth daily.   Co-Enzyme Q-10 30 MG Caps Take 30 mg by mouth daily.   diphenoxylate-atropine 2.5-0.025 MG tablet Commonly known as:  LOMOTIL Take 2 tablets by mouth 2 (two) times daily.   ferrous sulfate 325 (65 FE) MG tablet Take 325 mg by mouth daily with breakfast.   folic acid 1 MG tablet Commonly known as:  FOLVITE Take by mouth.   methotrexate 2.5 MG tablet Commonly known as:  RHEUMATREX Take 25 mg by mouth once a week. sundays   montelukast 10 MG tablet Commonly known as:  SINGULAIR TAKE 1 TABLET BY MOUTH  EVERY EVENING   MUCINEX 600 MG 12  hr tablet Generic drug:  guaiFENesin Take 600 mg by mouth daily.   multivitamin capsule Take 1 capsule by mouth daily.   predniSONE 10 MG tablet Commonly known as:  DELTASONE Take prednisone 75m daily with breakfast for a week, follow up with Dr BCristina Gongnext week for further prednisone does management. What changed:    medication strength  how much to take  how to take this  when to take this  additional instructions   PROTONIX 40 MG tablet Generic drug:  pantoprazole Take 40 mg by mouth every evening.   simvastatin 20 MG tablet Commonly known as:  ZOCOR Take 20 mg by mouth daily at 6 PM.   tamsulosin 0.4 MG Caps capsule Commonly known as:  FLOMAX Take 2 capsules (0.8 mg total) by mouth daily after supper.   TYLENOL 500 MG tablet Generic drug:  acetaminophen Take 1,000 mg by mouth every 8 (eight) hours as needed for moderate pain.      Allergies  Allergen Reactions  . Lipitor [Atorvastatin Calcium]     Muscle ache  . Moxifloxacin     REACTION: rash  . Penicillins Other (See Comments)    Patient has never had reaction, but Dr CGlynn Octavetold him not to take due to possible cross-sensitivity with mold allergy Has patient had a PCN reaction causing immediate rash, facial/tongue/throat swelling, SOB or lightheadedness with hypotension: No Has patient had a PCN reaction causing severe rash involving mucus membranes or skin necrosis: No Has patient had a PCN reaction that required hospitalization: No Has patient had a PCN reaction occurring within the last 10 years: No If all of the above   . Ciprofloxacin Diarrhea  . Molds & Smuts Rash   Follow-up Information    Buccini, Robert, MD Follow up in 1 week(s).   Specialty:  Gastroenterology Contact information: [id number] [phone]        BChesley Noon MD Follow up in 1 week(s).   Specialty:  Family Medicine Why:  hospital discharge follow up. pcp to follow up on  left leg ultrasound finding "Left popliteal fossa medial aspect, a heterogenous ill-defined structure with internal calcificatied components with shadowing. Measuring 3.44x4.10x1.80cm." Contact information: [place] [id number] [phone]           The results of significant diagnostics from this hospitalization (including imaging, microbiology, ancillary and laboratory) are listed below for reference.    Significant Diagnostic Studies: Dg Abdomen 1 View  Result Date: 05/09/2018 CLINICAL DATA:  Recent colitis, abdominal pain and diarrhea, status post flexible sigmoidoscopy EXAM: ABDOMEN - 1 VIEW COMPARISON:  10/25/2017 FINDINGS: Mild cardiomegaly. Minor basilar atelectasis versus scarring. Nonobstructive bowel gas pattern. Normal  stool burden in the right colon. 5 mm calculus in the right abdomen may represent nephrolithiasis, unchanged in position. Pelvic calcifications likely vascular. Bones are osteopenic. No acute finding by plain radiography. IMPRESSION: Nonobstructive bowel gas pattern. No acute finding or interval change Electronically Signed   By: Jerilynn Mages.  Shick M.D.   On: 05/09/2018 17:15    Microbiology: Recent Results (from the past 240 hour(s))  C difficile quick scan w PCR reflex     Status: None   Collection Time: 05/09/18  6:01 PM  Result Value Ref Range Status   C Diff antigen NEGATIVE NEGATIVE Final   C Diff toxin NEGATIVE NEGATIVE Final   C Diff interpretation No C. difficile detected.  Final    Comment: Performed at [facility], [place] [address], [place], [place] [address]     Labs: Basic Metabolic Panel: Recent Labs  Lab 05/09/18 1159 05/10/18 0515 05/11/18 0626 05/13/18 0612  NA 135 137 137 135  K 4.1 3.8 4.4 4.2  CL 104 104 105 102  CO2 21* 23 24 25   GLUCOSE 88 79 118* 108*  BUN 19 18 14 20   CREATININE 1.12 1.11 0.90 1.00  CALCIUM 8.9 8.4* 8.9 9.1  MG  --   --  2.5* 2.3   Liver Function Tests: Recent Labs  Lab  05/09/18 1159  AST 22  ALT 27  ALKPHOS 59  BILITOT 0.7  PROT 5.7*  ALBUMIN 3.1*   Recent Labs  Lab 05/09/18 1159  LIPASE 21   No results for input(s): AMMONIA in the last 168 hours. CBC: Recent Labs  Lab 05/09/18 1159 05/10/18 0515 05/11/18 0626 05/13/18 0612  WBC 5.7 5.5 7.0 13.0*  NEUTROABS  --   --  5.8  --   HGB 13.9 13.4 13.6 15.4  HCT 42.1 40.9 41.5 47.0  MCV 92.3 92.5 91.2 92.3  PLT 203 143* 198 266   Cardiac Enzymes: No results for input(s): CKTOTAL, CKMB, CKMBINDEX, TROPONINI in the last 168 hours. BNP: BNP (last 3 results) No results for input(s): BNP in the last 8760 hours.  ProBNP (last 3 results) No results for input(s): PROBNP in the last 8760 hours.  CBG: No results for input(s): GLUCAP in the last 168 hours.     Signed:  Florencia Reasons MD, PhD  [facility] 05/13/2018, 9:20 AM

## 2018-05-13 NOTE — Progress Notes (Signed)
Subjective: Patient was seen and examined at bedside. He continues to have several BMs, up to 6/day,stools are described as loose, not watery with minimal amount of blood. He reports that he has not had any mucus in stools and abdominal pain has improved-currently has mild periumbilical cramping before bowel movements  Objective: Vital signs in last 24 hours: Temp:  [97.4 F (36.3 C)-98.5 F (36.9 C)] 97.4 F (36.3 C) (10/07 0450) Pulse Rate:  [56-62] 56 (10/07 0450) Resp:  [16-18] 16 (10/07 0450) BP: (108-121)/(79-92) 121/92 (10/07 0450) SpO2:  [96 %-99 %] 98 % (10/07 0450) Weight change:  Last BM Date: 05/11/18  PE:no pallor, no icterus GENERAL:not in distress ABDOMEN:soft, nondistended, nontender, normoactive bowel sounds EXTREMITIES:no deformity  Lab Results: Results for orders placed or performed during the hospital encounter of 05/09/18 (from the past 48 hour(s))  CBC     Status: Abnormal   Collection Time: 05/13/18  6:12 AM  Result Value Ref Range   WBC 13.0 (H) 4.0 - 10.5 K/uL   RBC 5.09 4.22 - 5.81 MIL/uL   Hemoglobin 15.4 13.0 - 17.0 g/dL   HCT 47.0 39.0 - 52.0 %   MCV 92.3 78.0 - 100.0 fL   MCH 30.3 26.0 - 34.0 pg   MCHC 32.8 30.0 - 36.0 g/dL   RDW 14.9 11.5 - 15.5 %   Platelets 266 150 - 400 K/uL    Comment: Performed at [facility], [address] [place], [place] [address]  Basic metabolic panel     Status: Abnormal   Collection Time: 05/13/18  6:12 AM  Result Value Ref Range   Sodium 135 135 - 145 mmol/L   Potassium 4.2 3.5 - 5.1 mmol/L   Chloride 102 98 - 111 mmol/L   CO2 25 22 - 32 mmol/L   Glucose, Bld 108 (H) 70 - 99 mg/dL   BUN 20 8 - 23 mg/dL   Creatinine, Ser 1.00 0.61 - 1.24 mg/dL   Calcium 9.1 8.9 - 10.3 mg/dL   GFR calc non Af Amer >60 >60 mL/min   GFR calc Af Amer >60 >60 mL/min    Comment: (NOTE) The eGFR has been calculated using the CKD EPI equation. This calculation has not been validated in all clinical situations. eGFR's  persistently <60 mL/min signify possible Chronic Kidney Disease.    Anion gap 8 5 - 15    Comment: Performed at [facility], [address] [place], [place] [address]  Magnesium     Status: None   Collection Time: 05/13/18  6:12 AM  Result Value Ref Range   Magnesium 2.3 1.7 - 2.4 mg/dL    Comment: Performed at [facility], [address] [place], [place] [address]    Studies/Results: No results found.  Medications: I have reviewed the patient's current medications.  Assessment: Ulcerative colitis, clinically improving on IV Solu-Medrol 80 mg every 8 hours Tolerating soft diet, no signs of dehydration Remains afebrile,normal hemoglobin, normal renal function  Plan: 1. Labs for HBsAG and TB Gold mitogen testing today in anticipation of starting biologic therapy as an outpatient  2. Okay to discharge home today on tapering dose of prednisone( 40 mg by mouth daily for one week, then 30 mg by mouth daily for one week, then 20 mg by mouth daily for one week, then 10 mg by mouth daily for one week, then stop)  Had a lengthy discussion about various Biologics available, plan to escalate therapy for ulcerative colitis   Continue methotrexate 25 mg weekly along with folic acid and ferrous sulfate daily for now. Patient will follow up with Dr. Buccini as an outpatient within one week    05/13/2018, 8:21 AM   Pager [phone] If no answer or after 5 PM call [phone] 

## 2018-05-13 NOTE — Plan of Care (Signed)
  Problem: Education: Goal: Knowledge of General Education information will improve Description Including pain rating scale, medication(s)/side effects and non-pharmacologic comfort measures Outcome: Adequate for Discharge   Problem: Education: Goal: Knowledge of General Education information will improve Description Including pain rating scale, medication(s)/side effects and non-pharmacologic comfort measures Outcome: Adequate for Discharge   Problem: Health Behavior/Discharge Planning: Goal: Ability to manage health-related needs will improve Outcome: Adequate for Discharge   Problem: Clinical Measurements: Goal: Ability to maintain clinical measurements within normal limits will improve Outcome: Adequate for Discharge Goal: Will remain free from infection Outcome: Adequate for Discharge Goal: Diagnostic test results will improve Outcome: Adequate for Discharge Goal: Respiratory complications will improve Outcome: Adequate for Discharge Goal: Cardiovascular complication will be avoided Outcome: Adequate for Discharge   Problem: Activity: Goal: Risk for activity intolerance will decrease Outcome: Adequate for Discharge   Problem: Nutrition: Goal: Adequate nutrition will be maintained Outcome: Adequate for Discharge   Problem: Coping: Goal: Level of anxiety will decrease Outcome: Adequate for Discharge   Problem: Elimination: Goal: Will not experience complications related to bowel motility Outcome: Adequate for Discharge Goal: Will not experience complications related to urinary retention Outcome: Adequate for Discharge   Problem: Pain Managment: Goal: General experience of comfort will improve Outcome: Adequate for Discharge   Problem: Safety: Goal: Ability to remain free from injury will improve Outcome: Adequate for Discharge   Problem: Skin Integrity: Goal: Risk for impaired skin integrity will decrease Outcome: Adequate for Discharge

## 2018-05-13 NOTE — Progress Notes (Signed)
Patient discharged, vitalw WNL. Belongings with patient.

## 2018-05-14 LAB — HEPATITIS B SURFACE ANTIGEN: HEP B S AG: NEGATIVE

## 2018-05-15 LAB — QUANTIFERON-TB GOLD PLUS (RQFGPL)
QUANTIFERON TB1 AG VALUE: 0.02 [IU]/mL
QUANTIFERON TB2 AG VALUE: 0.02 [IU]/mL
QuantiFERON Mitogen Value: 0.43 IU/mL
QuantiFERON Nil Value: 0.02 IU/mL

## 2018-05-15 LAB — QUANTIFERON-TB GOLD PLUS: QuantiFERON-TB Gold Plus: UNDETERMINED

## 2018-11-11 ENCOUNTER — Other Ambulatory Visit: Payer: Self-pay | Admitting: Gastroenterology

## 2018-11-11 DIAGNOSIS — R1013 Epigastric pain: Secondary | ICD-10-CM

## 2018-11-12 ENCOUNTER — Other Ambulatory Visit (HOSPITAL_COMMUNITY): Payer: Self-pay | Admitting: Gastroenterology

## 2018-11-12 ENCOUNTER — Ambulatory Visit
Admission: RE | Admit: 2018-11-12 | Discharge: 2018-11-12 | Disposition: A | Discharge status: Home / Self Care | Payer: Medicare Other | Source: Ambulatory Visit | Attending: Gastroenterology | Admitting: Gastroenterology

## 2018-11-12 ENCOUNTER — Other Ambulatory Visit: Payer: Self-pay

## 2018-11-12 DIAGNOSIS — R1013 Epigastric pain: Secondary | ICD-10-CM

## 2018-11-12 DIAGNOSIS — R932 Abnormal findings on diagnostic imaging of liver and biliary tract: Secondary | ICD-10-CM

## 2018-11-14 ENCOUNTER — Ambulatory Visit (HOSPITAL_COMMUNITY)
Admission: RE | Admit: 2018-11-14 | Discharge: 2018-11-14 | Disposition: A | Discharge status: Home / Self Care | Payer: Medicare Other | Source: Ambulatory Visit | Attending: Gastroenterology | Admitting: Gastroenterology

## 2018-11-14 ENCOUNTER — Other Ambulatory Visit: Payer: Self-pay

## 2018-11-14 DIAGNOSIS — R1013 Epigastric pain: Secondary | ICD-10-CM | POA: Insufficient documentation

## 2018-11-14 DIAGNOSIS — R932 Abnormal findings on diagnostic imaging of liver and biliary tract: Secondary | ICD-10-CM | POA: Diagnosis present

## 2018-11-14 MED ORDER — TECHNETIUM TC 99M MEBROFENIN IV KIT
5.0000 | PACK | Freq: Once | INTRAVENOUS | Status: AC | PRN
  Administered 2018-11-14: 5 via INTRAVENOUS

## 2018-12-26 ENCOUNTER — Other Ambulatory Visit: Payer: Self-pay | Admitting: Gastroenterology

## 2018-12-26 DIAGNOSIS — R1033 Periumbilical pain: Secondary | ICD-10-CM

## 2018-12-26 DIAGNOSIS — K449 Diaphragmatic hernia without obstruction or gangrene: Secondary | ICD-10-CM

## 2019-01-03 ENCOUNTER — Ambulatory Visit
Admission: RE | Admit: 2019-01-03 | Discharge: 2019-01-03 | Disposition: A | Discharge status: Home / Self Care | Payer: Medicare Other | Source: Ambulatory Visit | Attending: Gastroenterology | Admitting: Gastroenterology

## 2019-01-03 DIAGNOSIS — K449 Diaphragmatic hernia without obstruction or gangrene: Secondary | ICD-10-CM

## 2019-01-03 DIAGNOSIS — R1033 Periumbilical pain: Secondary | ICD-10-CM

## 2019-03-11 ENCOUNTER — Emergency Department (HOSPITAL_BASED_OUTPATIENT_CLINIC_OR_DEPARTMENT_OTHER)
Admission: EM | Admit: 2019-03-11 | Discharge: 2019-03-11 | Disposition: A | Discharge status: Home / Self Care | Payer: Medicare Other | Attending: Emergency Medicine | Admitting: Emergency Medicine

## 2019-03-11 ENCOUNTER — Emergency Department (HOSPITAL_BASED_OUTPATIENT_CLINIC_OR_DEPARTMENT_OTHER): Payer: Medicare Other

## 2019-03-11 ENCOUNTER — Encounter (HOSPITAL_BASED_OUTPATIENT_CLINIC_OR_DEPARTMENT_OTHER): Payer: Self-pay

## 2019-03-11 ENCOUNTER — Other Ambulatory Visit: Payer: Self-pay

## 2019-03-11 DIAGNOSIS — R509 Fever, unspecified: Secondary | ICD-10-CM | POA: Diagnosis present

## 2019-03-11 DIAGNOSIS — J449 Chronic obstructive pulmonary disease, unspecified: Secondary | ICD-10-CM | POA: Insufficient documentation

## 2019-03-11 DIAGNOSIS — I2510 Atherosclerotic heart disease of native coronary artery without angina pectoris: Secondary | ICD-10-CM | POA: Insufficient documentation

## 2019-03-11 DIAGNOSIS — Z87891 Personal history of nicotine dependence: Secondary | ICD-10-CM | POA: Insufficient documentation

## 2019-03-11 DIAGNOSIS — J45909 Unspecified asthma, uncomplicated: Secondary | ICD-10-CM | POA: Diagnosis not present

## 2019-03-11 DIAGNOSIS — Z7982 Long term (current) use of aspirin: Secondary | ICD-10-CM | POA: Diagnosis not present

## 2019-03-11 DIAGNOSIS — Z79899 Other long term (current) drug therapy: Secondary | ICD-10-CM | POA: Insufficient documentation

## 2019-03-11 DIAGNOSIS — K2980 Duodenitis without bleeding: Secondary | ICD-10-CM | POA: Insufficient documentation

## 2019-03-11 LAB — URINALYSIS, ROUTINE W REFLEX MICROSCOPIC
Bilirubin Urine: NEGATIVE
Glucose, UA: NEGATIVE mg/dL
Hgb urine dipstick: NEGATIVE
Ketones, ur: NEGATIVE mg/dL
Leukocytes,Ua: NEGATIVE
Nitrite: NEGATIVE
Protein, ur: NEGATIVE mg/dL
Specific Gravity, Urine: 1.025 (ref 1.005–1.030)
pH: 5.5 (ref 5.0–8.0)

## 2019-03-11 LAB — CBC WITH DIFFERENTIAL/PLATELET
Abs Immature Granulocytes: 0.03 10*3/uL (ref 0.00–0.07)
Basophils Absolute: 0.1 10*3/uL (ref 0.0–0.1)
Basophils Relative: 1 %
Eosinophils Absolute: 1.2 10*3/uL — ABNORMAL HIGH (ref 0.0–0.5)
Eosinophils Relative: 10 %
HCT: 40.5 % (ref 39.0–52.0)
Hemoglobin: 13.2 g/dL (ref 13.0–17.0)
Immature Granulocytes: 0 %
Lymphocytes Relative: 10 %
Lymphs Abs: 1.2 10*3/uL (ref 0.7–4.0)
MCH: 28.9 pg (ref 26.0–34.0)
MCHC: 32.6 g/dL (ref 30.0–36.0)
MCV: 88.8 fL (ref 80.0–100.0)
Monocytes Absolute: 0.8 10*3/uL (ref 0.1–1.0)
Monocytes Relative: 7 %
Neutro Abs: 8.5 10*3/uL — ABNORMAL HIGH (ref 1.7–7.7)
Neutrophils Relative %: 72 %
Platelets: 303 10*3/uL (ref 150–400)
RBC: 4.56 MIL/uL (ref 4.22–5.81)
RDW: 13.4 % (ref 11.5–15.5)
WBC: 11.8 10*3/uL — ABNORMAL HIGH (ref 4.0–10.5)
nRBC: 0 % (ref 0.0–0.2)

## 2019-03-11 LAB — COMPREHENSIVE METABOLIC PANEL
ALT: 23 U/L (ref 0–44)
AST: 16 U/L (ref 15–41)
Albumin: 3.2 g/dL — ABNORMAL LOW (ref 3.5–5.0)
Alkaline Phosphatase: 78 U/L (ref 38–126)
Anion gap: 10 (ref 5–15)
BUN: 16 mg/dL (ref 8–23)
CO2: 23 mmol/L (ref 22–32)
Calcium: 8.9 mg/dL (ref 8.9–10.3)
Chloride: 104 mmol/L (ref 98–111)
Creatinine, Ser: 1.06 mg/dL (ref 0.61–1.24)
GFR calc Af Amer: >60 mL/min (ref >60)
GFR calc non Af Amer: >60 mL/min (ref >60)
Glucose, Bld: 122 mg/dL — ABNORMAL HIGH (ref 70–99)
Potassium: 4 mmol/L (ref 3.5–5.1)
Sodium: 137 mmol/L (ref 135–145)
Total Bilirubin: 0.5 mg/dL (ref 0.3–1.2)
Total Protein: 6.5 g/dL (ref 6.5–8.1)

## 2019-03-11 LAB — LIPASE, BLOOD: Lipase: 24 U/L (ref 11–51)

## 2019-03-11 MED ORDER — IOHEXOL 300 MG/ML  SOLN
100.0000 mL | Freq: Once | INTRAMUSCULAR | Status: AC | PRN
  Administered 2019-03-11: 13:00:00 100 mL via INTRAVENOUS

## 2019-03-11 MED ORDER — SODIUM CHLORIDE 0.9 % IV BOLUS
1000.0000 mL | Freq: Once | INTRAVENOUS | Status: AC
  Administered 2019-03-11: 13:00:00 1000 mL via INTRAVENOUS

## 2019-03-11 MED ORDER — MORPHINE SULFATE (PF) 4 MG/ML IV SOLN
4.0000 mg | Freq: Once | INTRAVENOUS | Status: AC
  Administered 2019-03-11: 13:00:00 4 mg via INTRAVENOUS
  Filled 2019-03-11: qty 1

## 2019-03-11 MED ORDER — ONDANSETRON HCL 4 MG/2ML IJ SOLN
4.0000 mg | Freq: Once | INTRAMUSCULAR | Status: AC
  Administered 2019-03-11: 13:00:00 4 mg via INTRAVENOUS
  Filled 2019-03-11: qty 2

## 2019-03-11 NOTE — ED Notes (Signed)
Family at bedside. 

## 2019-03-11 NOTE — ED Triage Notes (Signed)
C/o abd pain, fever x 4 das-nausea no v/d-telemed call with PCP yesterday and advised by phone by GI to come to ED-NAD-steady gait

## 2019-03-11 NOTE — Discharge Instructions (Signed)
Please call your gastroenterologist and discuss your CT imaging findings with him.  See if he wants to change your Protonix to something else for your duodenitis.  I am not sure of the cause of your fever.  This could be a viral illness.  Unfortunately we are in the middle of the coronavirus pandemic.  Please return to the ED for worsening abdominal pain inability to eat or drink or trouble breathing.

## 2019-03-11 NOTE — ED Provider Notes (Signed)
[facility] Provider Note   CSN: [id number] Arrival date & time: 03/11/19  1125    History   Chief Complaint Chief Complaint  Patient presents with   Abdominal Pain    HPI [name] is a 78 y.o. male.     78 yo M with a chief complaint of diffuse abdominal pain and a fever.  Going on for the past few days.  Is a history of pancolitis that was thought to be due to antibiotics for pneumonia.  This was about a month ago.  The symptoms had resolved and now they have somewhat returned.  They are concerned about the novel coronavirus.  They called their GI doctor who suggested they come to the ED for evaluation.  Has a chronic cough at baseline but no worsening.  No worsening sputum production or change sputum.  No shortness of breath.  Having fevers off and on.  Some nausea but no vomiting no diarrhea.  Pain is worse to the epigastrium.  The history is provided by the patient.  Abdominal Pain Pain location:  Epigastric Pain radiates to:  Does not radiate Pain severity:  Moderate Onset quality:  Gradual Duration:  2 days Timing:  Constant Chronicity:  New Relieved by:  Nothing Worsened by:  Nothing Ineffective treatments:  None tried Associated symptoms: nausea   Associated symptoms: no chest pain, no chills, no diarrhea, no fever, no shortness of breath and no vomiting     Past Medical History:  Diagnosis Date   Arthritis    "joints" (05/09/2018)   Asthma    Chronic bronchitis    COPD (chronic obstructive pulmonary disease) (HCC)    GERD (gastroesophageal reflux disease)    Hearing loss in left ear    High cholesterol    History of hiatal hernia    Lymphedema of left leg    "foot" (05/09/2018)   Peripheral edema    Pneumonia 04/2018   Sinusitis    Ulcerative colitis     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 ([place]) 78/29/5621   Asthma  05/09/2018   OA (osteoarthritis) of knee 08/13/2015   Chronic bronchitis with acute exacerbation ([place]) 09/23/2013   Atherosclerosis of native arteries of the extremities with intermittent claudication 06/18/2012   Pain in limb 06/18/2012   Claudication ([place]) 05/30/2012   Frequency of urination 03/13/2012   General medical examination 05/29/2011   Myalgia 04/04/2011   DERMATOPHYTOSIS OF NAIL 09/22/2010   INGUINAL HERNIA, LEFT 09/22/2010   DECREASED HEARING 12/23/2009   RHINITIS 12/23/2009   UNSPECIFIED DISORDER OF SKIN&SUBCUTANEOUS TISSUE 12/23/2009   Hyperlipidemia 09/13/2009   LYMPHEDEMA 08/27/2009   ELEVATED BLOOD PRESSURE WITHOUT DIAGNOSIS OF HYPERTENSION 08/27/2009   BENIGN PROSTATIC HYPERTROPHY, MILD, HX OF 08/27/2009   OTHER CHRONIC SINUSITIS 08/09/2007   BRONCHITIS, CHRONIC 08/09/2007   HIATAL HERNIA 08/09/2007   Ulcerative colitis ([place]) 08/09/2007    Past Surgical History:  Procedure Laterality Date   CATARACT EXTRACTION W/ INTRAOCULAR LENS  IMPLANT, BILATERAL Bilateral    COLONOSCOPY W/ BIOPSIES AND POLYPECTOMY     several with polys removed   HERNIA REPAIR  2013   x 3 hernias repaired   JOINT REPLACEMENT     SHOULDER OPEN ROTATOR CUFF REPAIR Bilateral    TOTAL KNEE ARTHROPLASTY Right 08/13/2015   Procedure: RIGHT TOTAL KNEE ARTHROPLASTY;  Surgeon: Gaynelle Arabian, MD;  Location: [facility];  Service: Orthopedics;  Laterality: Right;  Home Medications    Prior to Admission medications   Medication Sig Start Date End Date Taking? Authorizing Provider  acetaminophen (TYLENOL) 500 MG tablet Take 1,000 mg by mouth every 8 (eight) hours as needed for moderate pain.    Yes [provider]  ADVAIR DISKUS 100-50 MCG/DOSE AEPB USE 1 INHALATION INTO THE  LUNGS 2 TIMES DAILY 07/10/16  Yes Young, Clinton D, MD  albuterol (PROVENTIL HFA;VENTOLIN HFA) 108 (90 Base) MCG/ACT inhaler Inhale 2 puffs into the lungs every 4 (four) hours as needed  for wheezing or shortness of breath. 02/25/16  Yes Young, Tarri Fuller D, MD  aspirin 81 MG chewable tablet Chew 81 mg by mouth at bedtime.   Yes [provider]  Calcium Carbonate-Vitamin D (CALCIUM 600/VITAMIN D) 600-400 MG-UNIT chew tablet Chew 1 tablet by mouth daily.   Yes [provider]  Co-Enzyme Q-10 30 MG CAPS Take 30 mg by mouth daily.    Yes [provider]  diphenoxylate-atropine (LOMOTIL) 2.5-0.025 MG tablet Take 2 tablets by mouth 2 (two) times daily.    Yes [provider]  ferrous sulfate 325 (65 FE) MG tablet Take 325 mg by mouth daily with breakfast.   Yes [provider]  guaiFENesin (MUCINEX) 600 MG 12 hr tablet Take 600 mg by mouth daily.    Yes [provider]  loratadine (CLARITIN) 10 MG tablet Take 10 mg by mouth daily.     Yes [provider]  montelukast (SINGULAIR) 10 MG tablet TAKE 1 TABLET BY MOUTH  EVERY EVENING 01/29/17  Yes Young, Tarri Fuller D, MD  Multiple Vitamin (MULTIVITAMIN) capsule Take 1 capsule by mouth daily.   Yes [provider]  pantoprazole (PROTONIX) 40 MG tablet Take 40 mg by mouth every evening.    Yes [provider]  predniSONE (DELTASONE) 10 MG tablet Take prednisone 87m daily with breakfast for a week, follow up with Dr BCristina Gongnext week for further prednisone does management. 05/13/18  Yes XFlorencia Reasons MD  simvastatin (ZOCOR) 20 MG tablet Take 20 mg by mouth daily at 6 PM.    Yes [provider]  tamsulosin (FLOMAX) 0.4 MG CAPS capsule Take 2 capsules (0.8 mg total) by mouth daily after supper. 08/14/15  Yes Aluisio, FPilar Plate MD  vedolizumab (ENTYVIO) 300 MG injection Inject into the vein.   Yes [provider]  folic acid (FOLVITE) 1 MG tablet Take by mouth. 09/09/13   [provider]  methotrexate (RHEUMATREX) 2.5 MG tablet Take 25 mg by mouth once a week. sundays 01/09/16   [provider]    Family History Family History  Problem Relation Age  of Onset   Heart disease Mother        MI   Hyperlipidemia Mother    Other Mother        varicose veins   Heart attack Mother    COPD Sister    Hyperlipidemia Father    Hypertension Father     Social History Social History   Tobacco Use   Smoking status: Former Smoker    Packs/day: 2.00    Years: 15.00    Pack years: 30.00    Types: Cigarettes    Quit date: 08/08/1971    Years since quitting: 47.6   Smokeless tobacco: Former [employer]   Types: Chew    Quit date: 08/08/1971  Substance Use Topics   Alcohol use: No   Drug use: Never     Allergies   Lipitor [atorvastatin calcium],  Moxifloxacin, Penicillins, Ciprofloxacin, and Molds & smuts   Review of Systems Review of Systems  Constitutional: Negative for chills and fever.  HENT: Negative for congestion and facial swelling.   Eyes: Negative for discharge and visual disturbance.  Respiratory: Negative for shortness of breath.   Cardiovascular: Negative for chest pain and palpitations.  Gastrointestinal: Positive for abdominal pain and nausea. Negative for diarrhea and vomiting.  Musculoskeletal: Negative for arthralgias and myalgias.  Skin: Negative for color change and rash.  Neurological: Negative for tremors, syncope and headaches.  Psychiatric/Behavioral: Negative for confusion and dysphoric mood.     Physical Exam Updated Vital Signs BP 116/74    Pulse 65    Temp 98.5 F (36.9 C) (Oral)    Resp 20    Ht 5' 10"  (1.778 m)    Wt 78 kg    SpO2 97%    BMI 24.68 kg/m   Physical Exam Vitals signs and nursing note reviewed.  Constitutional:      Appearance: He is well-developed.  HENT:     Head: Normocephalic and atraumatic.  Eyes:     Pupils: Pupils are equal, round, and reactive to light.  Neck:     Musculoskeletal: Normal range of motion and neck supple.     Vascular: No JVD.  Cardiovascular:     Rate and Rhythm: Normal rate and regular rhythm.     Heart sounds: No murmur. No friction rub. No  gallop.   Pulmonary:     Effort: No respiratory distress.     Breath sounds: No wheezing.  Abdominal:     General: There is no distension.     Tenderness: There is abdominal tenderness (epigastric). There is no guarding or rebound.  Musculoskeletal: Normal range of motion.  Skin:    Coloration: Skin is not pale.     Findings: No rash.  Neurological:     Mental Status: He is alert and oriented to person, place, and time.  Psychiatric:        Behavior: Behavior normal.      ED Treatments / Results  Labs (all labs ordered are listed, but only abnormal results are displayed) Labs Reviewed  CBC WITH DIFFERENTIAL/PLATELET - Abnormal; Notable for the following components:      Result Value   WBC 11.8 (*)    Neutro Abs 8.5 (*)    Eosinophils Absolute 1.2 (*)    All other components within normal limits  COMPREHENSIVE METABOLIC PANEL - Abnormal; Notable for the following components:   Glucose, Bld 122 (*)    Albumin 3.2 (*)    All other components within normal limits  LIPASE, BLOOD  URINALYSIS, ROUTINE W REFLEX MICROSCOPIC    EKG EKG Interpretation  Date/Time:  Tuesday March 11 2019 12:29:08 EDT Ventricular Rate:  68 PR Interval:    QRS Duration: 127 QT Interval:  420 QTC Calculation: 447 R Axis:   131 Text Interpretation:  Sinus rhythm Right bundle branch block Borderline ST elevation, lateral leads No significant change since last tracing Confirmed by Deno Etienne [phone]) on 03/11/2019 12:56:26 PM   Radiology Ct Abdomen Pelvis W Contrast  Result Date: 03/11/2019 CLINICAL DATA:  Abdominal pain, fever and nausea for 4 days. EXAM: CT ABDOMEN AND PELVIS WITH CONTRAST TECHNIQUE: Multidetector CT imaging of the abdomen and pelvis was performed using the standard protocol following bolus administration of intravenous contrast. CONTRAST:  100 mL OMNIPAQUE IOHEXOL 300 MG/ML  SOLN COMPARISON:  CT abdomen and pelvis 02/05/2014. FINDINGS: Lower chest: There is  some bronchiectasis and  scar in the lung bases, worse on the left, unchanged compared to the prior study. No pleural or pericardial effusion. Heart size is normal. Hepatobiliary: No focal liver abnormality is seen. No gallstones, gallbladder wall thickening, or biliary dilatation. Pancreas: Unremarkable. No pancreatic ductal dilatation or surrounding inflammatory changes. Spleen: Normal in size without focal abnormality. Adrenals/Urinary Tract: The adrenal glands appear normal. Parapelvic renal cysts are present bilaterally. Nonobstructing stone in the upper pole of the right kidney measures 0.6 cm. No hydronephrosis or ureteral stones. Urinary bladder is unremarkable. The adrenal glands appear normal. Stomach/Bowel: Moderate hiatal hernia is seen. There is marked thickening of the walls of the transverse duodenum with surrounding stranding and fluid consistent with duodenitis. No focal ulceration is seen by CT. The remainder of the small bowel is normal in appearance. Mild diverticulosis of the colon without diverticulitis is identified. The colon otherwise appears normal. No evidence of appendicitis is seen. Vascular/Lymphatic: Aortic atherosclerosis. No enlarged abdominal or pelvic lymph nodes. Reproductive: Prostatomegaly noted. Other: None. Musculoskeletal: No acute or focal abnormality. IMPRESSION: Findings most consistent with duodenitis of the transverse duodenum. No focal ulcer is visible on CT. No other acute finding. Moderate hiatal hernia. Atherosclerosis. Diverticulosis without diverticulitis. 0.6 cm nonobstructing stone right kidney. Prostatomegaly. Electronically Signed   By: Inge Rise M.D.   On: 03/11/2019 13:09   Dg Chest Port 1 View  Result Date: 03/11/2019 CLINICAL DATA:  Abdominal pain and fever for 4 days, nausea without vomiting, history COPD, GERD, hiatal hernia EXAM: PORTABLE CHEST 1 VIEW COMPARISON:  Portable exam 1322 hours compared 03/08/2018 FINDINGS: Lordotic positioning. Enlargement of cardiac  silhouette. Mediastinal contours and pulmonary vascularity normal. Atherosclerotic calcification aorta. Lungs clear. No pleural effusion or pneumothorax. Bones demineralized. BILATERAL chronic rotator cuff tears. IMPRESSION: No acute abnormalities. Electronically Signed   By: Lavonia Dana M.D.   On: 03/11/2019 13:55    Procedures Procedures (including critical care time)  Medications Ordered in ED Medications  sodium chloride 0.9 % bolus 1,000 mL (0 mLs Intravenous Stopped 03/11/19 1352)  morphine 4 MG/ML injection 4 mg (4 mg Intravenous Given 03/11/19 1236)  ondansetron (ZOFRAN) injection 4 mg (4 mg Intravenous Given 03/11/19 1235)  iohexol (OMNIPAQUE) 300 MG/ML solution 100 mL (100 mLs Intravenous Contrast Given 03/11/19 1246)     Initial Impression / Assessment and Plan / ED Course  I have reviewed the triage vital signs and the nursing notes.  Pertinent labs & imaging results that were available during my care of the patient were reviewed by me and considered in my medical decision making (see chart for details).        78 yo M with a chief complaint of diffuse abdominal pain and fever.  Mild pain to the epigastrium.  Will obtain a CT scan.  CT scan shows a duodenitis.  UA is negative for infection.  He has a very mild leukocytosis at 11.8.  I discussed the case with Dr. Jessie Foot gastroenterology.  He felt that duodenitis was unlikely to cause a fever.  Chest x-ray was added on which was negative for pneumonia as viewed by me.  The patient continues to be somewhat well-appearing.  Tolerating by mouth.  I discussed risk and benefits of inpatient versus outpatient treatment.  He is electing to be discharged at this time.  We will have him discuss his case with Dr. Janeece Riggers.  He is a covered test pending.  We will have him continue self isolate.  Return for worsening shortness  of breath worsening pain or inability to eat or drink.  3:07 PM:  I have discussed the diagnosis/risks/treatment  options with the patient and believe the pt to be eligible for discharge home to follow-up with PCP, GI. We also discussed returning to the ED immediately if new or worsening sx occur. We discussed the sx which are most concerning (e.g., sudden worsening pain, fever, inability to tolerate by mouth) that necessitate immediate return. Medications administered to the patient during their visit and any new prescriptions provided to the patient are listed below.  Medications given during this visit Medications  sodium chloride 0.9 % bolus 1,000 mL (0 mLs Intravenous Stopped 03/11/19 1352)  morphine 4 MG/ML injection 4 mg (4 mg Intravenous Given 03/11/19 1236)  ondansetron (ZOFRAN) injection 4 mg (4 mg Intravenous Given 03/11/19 1235)  iohexol (OMNIPAQUE) 300 MG/ML solution 100 mL (100 mLs Intravenous Contrast Given 03/11/19 1246)     The patient appears reasonably screen and/or stabilized for discharge and I doubt any other medical condition or other [facility] requiring further screening, evaluation, or treatment in the ED at this time prior to discharge.    Final Clinical Impressions(s) / ED Diagnoses   Final diagnoses:  Duodenitis  Fever in adult    ED Discharge Orders    None       Deno Etienne, DO 03/11/19 1507

## 2019-03-11 NOTE — ED Notes (Signed)
Report given to Andrea RN

## 2019-03-11 NOTE — ED Notes (Signed)
ED Provider at bedside. 

## 2019-03-11 NOTE — ED Notes (Signed)
Patient transported to CT 

## 2019-03-13 ENCOUNTER — Other Ambulatory Visit: Payer: Self-pay | Admitting: Gastroenterology

## 2019-03-14 ENCOUNTER — Other Ambulatory Visit (HOSPITAL_COMMUNITY)
Admission: RE | Admit: 2019-03-14 | Discharge: 2019-03-14 | Disposition: A | Discharge status: Home / Self Care | Payer: Medicare Other | Source: Ambulatory Visit | Attending: Gastroenterology | Admitting: Gastroenterology

## 2019-03-14 DIAGNOSIS — Z01812 Encounter for preprocedural laboratory examination: Secondary | ICD-10-CM | POA: Diagnosis present

## 2019-03-14 DIAGNOSIS — Z20828 Contact with and (suspected) exposure to other viral communicable diseases: Secondary | ICD-10-CM | POA: Diagnosis not present

## 2019-03-14 LAB — SARS CORONAVIRUS 2 (TAT 6-24 HRS): SARS Coronavirus 2: NEGATIVE

## 2019-03-17 ENCOUNTER — Other Ambulatory Visit: Payer: Self-pay | Admitting: Gastroenterology

## 2019-03-17 ENCOUNTER — Encounter (HOSPITAL_COMMUNITY): Payer: Self-pay | Admitting: *Deleted

## 2019-03-17 ENCOUNTER — Other Ambulatory Visit: Payer: Self-pay

## 2019-03-17 NOTE — Anesthesia Preprocedure Evaluation (Addendum)
Anesthesia Evaluation  Patient identified by MRN, date of birth, ID band Patient awake    Reviewed: Allergy & Precautions, NPO status , Patient's Chart, lab work & pertinent test results  Airway Mallampati: II  TM Distance: >3 FB Neck ROM: Full    Dental no notable dental hx. (+) Teeth Intact, Partial Upper   Pulmonary asthma , COPD, former smoker,    Pulmonary exam normal breath sounds clear to auscultation       Cardiovascular Exercise Tolerance: Good negative cardio ROS Normal cardiovascular exam Rhythm:Regular Rate:Normal     Neuro/Psych  Neuromuscular disease    GI/Hepatic Neg liver ROS, PUD,   Endo/Other  negative endocrine ROS  Renal/GU negative Renal ROS     Musculoskeletal negative musculoskeletal ROS (+)   Abdominal   Peds  Hematology negative hematology ROS (+)   Anesthesia Other Findings   Reproductive/Obstetrics                            Anesthesia Physical Anesthesia Plan  ASA: III  Anesthesia Plan: MAC   Post-op Pain Management:    Induction: Intravenous  PONV Risk Score and Plan:   Airway Management Planned: Simple Face Mask and Natural Airway  Additional Equipment:   Intra-op Plan:   Post-operative Plan:   Informed Consent: I have reviewed the patients History and Physical, chart, labs and discussed the procedure including the risks, benefits and alternatives for the proposed anesthesia with the patient or authorized representative who has indicated his/her understanding and acceptance.     Dental advisory given  Plan Discussed with:   Anesthesia Plan Comments:        Anesthesia Quick Evaluation

## 2019-03-18 ENCOUNTER — Ambulatory Visit (HOSPITAL_COMMUNITY): Payer: Medicare Other | Admitting: Certified Registered"

## 2019-03-18 ENCOUNTER — Ambulatory Visit (HOSPITAL_COMMUNITY)
Admission: RE | Admit: 2019-03-18 | Discharge: 2019-03-18 | Disposition: A | Discharge status: Home / Self Care | Payer: Medicare Other | Attending: Gastroenterology | Admitting: Gastroenterology

## 2019-03-18 ENCOUNTER — Encounter (HOSPITAL_COMMUNITY)
Admission: RE | Disposition: A | Discharge status: Home / Self Care | Payer: Self-pay | Source: Home / Self Care | Attending: Gastroenterology

## 2019-03-18 ENCOUNTER — Encounter (HOSPITAL_COMMUNITY): Payer: Self-pay | Admitting: *Deleted

## 2019-03-18 DIAGNOSIS — E7800 Pure hypercholesterolemia, unspecified: Secondary | ICD-10-CM | POA: Insufficient documentation

## 2019-03-18 DIAGNOSIS — K529 Noninfective gastroenteritis and colitis, unspecified: Secondary | ICD-10-CM | POA: Diagnosis not present

## 2019-03-18 DIAGNOSIS — M1990 Unspecified osteoarthritis, unspecified site: Secondary | ICD-10-CM | POA: Diagnosis not present

## 2019-03-18 DIAGNOSIS — Z79899 Other long term (current) drug therapy: Secondary | ICD-10-CM | POA: Diagnosis not present

## 2019-03-18 DIAGNOSIS — R948 Abnormal results of function studies of other organs and systems: Secondary | ICD-10-CM | POA: Diagnosis present

## 2019-03-18 DIAGNOSIS — Z961 Presence of intraocular lens: Secondary | ICD-10-CM | POA: Diagnosis not present

## 2019-03-18 DIAGNOSIS — Z7982 Long term (current) use of aspirin: Secondary | ICD-10-CM | POA: Diagnosis not present

## 2019-03-18 DIAGNOSIS — Z96651 Presence of right artificial knee joint: Secondary | ICD-10-CM | POA: Insufficient documentation

## 2019-03-18 DIAGNOSIS — K449 Diaphragmatic hernia without obstruction or gangrene: Secondary | ICD-10-CM | POA: Diagnosis not present

## 2019-03-18 DIAGNOSIS — Z9842 Cataract extraction status, left eye: Secondary | ICD-10-CM | POA: Insufficient documentation

## 2019-03-18 DIAGNOSIS — J440 Chronic obstructive pulmonary disease with acute lower respiratory infection: Secondary | ICD-10-CM | POA: Insufficient documentation

## 2019-03-18 DIAGNOSIS — Z7951 Long term (current) use of inhaled steroids: Secondary | ICD-10-CM | POA: Insufficient documentation

## 2019-03-18 DIAGNOSIS — K219 Gastro-esophageal reflux disease without esophagitis: Secondary | ICD-10-CM | POA: Insufficient documentation

## 2019-03-18 DIAGNOSIS — F17210 Nicotine dependence, cigarettes, uncomplicated: Secondary | ICD-10-CM | POA: Insufficient documentation

## 2019-03-18 DIAGNOSIS — K2270 Barrett's esophagus without dysplasia: Secondary | ICD-10-CM | POA: Diagnosis not present

## 2019-03-18 DIAGNOSIS — Z9841 Cataract extraction status, right eye: Secondary | ICD-10-CM | POA: Diagnosis not present

## 2019-03-18 DIAGNOSIS — Z8711 Personal history of peptic ulcer disease: Secondary | ICD-10-CM | POA: Diagnosis not present

## 2019-03-18 HISTORY — PX: BIOPSY: SHX5522

## 2019-03-18 HISTORY — PX: FLEXIBLE SIGMOIDOSCOPY: SHX5431

## 2019-03-18 HISTORY — PX: ESOPHAGOGASTRODUODENOSCOPY (EGD) WITH PROPOFOL: SHX5813

## 2019-03-18 SURGERY — ESOPHAGOGASTRODUODENOSCOPY (EGD) WITH PROPOFOL
Anesthesia: Monitor Anesthesia Care

## 2019-03-18 MED ORDER — PROPOFOL 500 MG/50ML IV EMUL
INTRAVENOUS | Status: DC | PRN
  Administered 2019-03-18: 135 ug/kg/min via INTRAVENOUS

## 2019-03-18 MED ORDER — PHENYLEPHRINE HCL (PRESSORS) 10 MG/ML IV SOLN
INTRAVENOUS | Status: DC | PRN
  Administered 2019-03-18 (×2): 120 ug via INTRAVENOUS

## 2019-03-18 MED ORDER — PROPOFOL 500 MG/50ML IV EMUL
INTRAVENOUS | Status: DC | PRN
  Administered 2019-03-18 (×2): 30 mg via INTRAVENOUS
  Administered 2019-03-18: 40 mg via INTRAVENOUS

## 2019-03-18 MED ORDER — GLYCOPYRROLATE 0.2 MG/ML IJ SOLN
INTRAMUSCULAR | Status: DC | PRN
  Administered 2019-03-18: 0.2 mg via INTRAVENOUS

## 2019-03-18 MED ORDER — PROPOFOL 10 MG/ML IV BOLUS
INTRAVENOUS | Status: AC
  Filled 2019-03-18: qty 160

## 2019-03-18 MED ORDER — LACTATED RINGERS IV SOLN
INTRAVENOUS | Status: DC
  Administered 2019-03-18: 11:00:00 1000 mL via INTRAVENOUS

## 2019-03-18 MED ORDER — LIDOCAINE HCL (CARDIAC) PF 100 MG/5ML IV SOSY
PREFILLED_SYRINGE | INTRAVENOUS | Status: DC | PRN
  Administered 2019-03-18: 20 mg via INTRATRACHEAL
  Administered 2019-03-18: 80 mg via INTRATRACHEAL

## 2019-03-18 MED ORDER — SODIUM CHLORIDE 0.9 % IV SOLN: INTRAVENOUS | Status: DC

## 2019-03-18 SURGICAL SUPPLY — 15 items

## 2019-03-18 NOTE — Discharge Instructions (Signed)

## 2019-03-18 NOTE — Op Note (Signed)
[facility] Patient Name: [name] Procedure Date: 03/18/2019 MRN: [id number] Attending MD: Ronald Lobo , MD Date of Birth: [birth year] CSN: [id number] Age: 78 Admit Type: Outpatient Procedure:                Flexible Sigmoidoscopy Indications:              Personal history of ulcerative colitis, abdominal                            pain, persistent eosinophilia Providers:                Ronald Lobo, MD, Raynelle Bring, RN, Marguerita Merles, Technician Referring MD:              Medicines:                Monitored Anesthesia Care Complications:            No immediate complications. Estimated Blood Loss:     Estimated blood loss was minimal. Procedure:                Pre-Anesthesia Assessment:                           - Prior to the procedure, a History and Physical                            was performed, and patient medications and                            allergies were reviewed. The patient's tolerance of                            previous anesthesia was also reviewed. The risks                            and benefits of the procedure and the sedation                            options and risks were discussed with the patient.                            All questions were answered, and informed consent                            was obtained. Prior Anticoagulants: The patient has                            taken no previous anticoagulant or antiplatelet                            agents except for aspirin. ASA Grade Assessment:                            III - A  patient with severe systemic disease. After                            reviewing the risks and benefits, the patient was                            deemed in satisfactory condition to undergo the                            procedure.                           After obtaining informed consent, the scope was                            passed under direct vision. The  Olympus upper                            endoscope was used as a sigmoidoscope for this                            procedure, and was introduced through the anus and                            advanced to the 30 cm from the anal verge. The                            flexible sigmoidoscopy was accomplished without                            difficulty. The patient tolerated the procedure                            well. The quality of the bowel preparation                            (unprepped) was adequate for mucosal visualization,                            although some solid formed stool balls were present. Scope In: Scope Out: Findings:      The perianal and digital rectal examinations were normal. Pertinent       negatives include normal although somewhat enlarged prostate (shape, and       consistency).      A few pseudopolyps were found in the sigmoid colon as had been noted on       past exams.      The mucosa vascular pattern in the rectum and in the sigmoid colon was       diffusely decreased, suggestive of "burned out" colitis without current       inflammatory changes such as erythema, friability, granularity, or       exudate.      Biopsies were taken with a cold forceps in the rectum and in the sigmoid       colon for histology. Estimated blood loss was minimal.  Retroflexion was       not performed in the rectum but antegrade viewing disclosed no       additional abnormalities. Impression:               - Pseudopolyps in the sigmoid colon.                           - Decreased mucosa vascular pattern in the rectum                            and in the sigmoid colon, suggestive of quiescent                            "burned out" colitis without significant current                            activity.                           - Biopsies were taken with a cold forceps for                            histology in the rectum and in the sigmoid colon. Moderate Sedation:       This patient was sedated with monitored anesthesia care, not moderate       sedation. Recommendation:           - Await pathology results.                           - Continue present medications. Procedure Code(s):        --- Professional ---                           [id number], Sigmoidoscopy, flexible; with biopsy, single                            or multiple Diagnosis Code(s):        --- Professional ---                           K51.40, Inflammatory polyps of colon without                            complications                           Z87.19, Personal history of other diseases of the                            digestive system CPT copyright 2019 [employer]. All rights reserved. The codes documented in this report are preliminary and upon coder review may  be revised to meet current compliance requirements. Ronald Lobo, MD 03/18/2019 12:29:14 PM This report has been signed electronically. Number of Addenda: 0

## 2019-03-18 NOTE — H&P (Signed)
[name] is an 78 y.o. male.   Chief Complaint: upper abdominal pain HPI: 78 year old gentleman with recent severe abdominal pain of sufficient severity to take him to the emergency room, where a CT scan showed significant duodenal thickening in the third portion of the duodenum.  That was about 10 days ago and, since then, he has had fairly complete resolution of the pain, with perhaps just some mild residual pain.  This patient also has a longstanding history of ulcerative colitis, currently asymptomatic as far as the patient can tell, but requiring ongoing therapy with both entyvio and low-dose prednisone.   Past Medical History:  Diagnosis Date  . Arthritis    "joints" (05/09/2018)  . Asthma   . Chronic bronchitis   . COPD (chronic obstructive pulmonary disease) ([place])   . GERD (gastroesophageal reflux disease)   . Hearing loss in left ear   . High cholesterol   . History of hiatal hernia   . Lymphedema of left leg    "foot" (05/09/2018)  . Peripheral edema   . Pneumonia 04/2018  . Sinusitis   . Ulcerative colitis     Past Surgical History:  Procedure Laterality Date  . CATARACT EXTRACTION W/ INTRAOCULAR LENS  IMPLANT, BILATERAL Bilateral   . COLONOSCOPY W/ BIOPSIES AND POLYPECTOMY     several with polys removed  . HERNIA REPAIR  2013   x 3 hernias repaired  . JOINT REPLACEMENT    . SHOULDER OPEN ROTATOR CUFF REPAIR Bilateral   . TOTAL KNEE ARTHROPLASTY Right 08/13/2015   Procedure: RIGHT TOTAL KNEE ARTHROPLASTY;  Surgeon: Gaynelle Arabian, MD;  Location: [facility];  Service: Orthopedics;  Laterality: Right;    Family History  Problem Relation Age of Onset  . Heart disease Mother        MI  . Hyperlipidemia Mother   . Other Mother        varicose veins  . Heart attack Mother   . COPD Sister   . Hyperlipidemia Father   . Hypertension Father    Social History:  reports that he quit smoking about 47 years ago. His smoking use included cigarettes. He has a 30.00  pack-year smoking history. He quit smokeless tobacco use about 47 years ago.  His smokeless tobacco use included chew. He reports that he does not drink alcohol or use drugs.  Allergies:  Allergies  Allergen Reactions  . Lipitor [Atorvastatin Calcium] Other (See Comments)    Muscle ache  . Penicillins Other (See Comments)    Patient has never had reaction, but Dr Glynn Octave told him not to take due to possible cross-sensitivity with mold allergy Has patient had a PCN reaction causing immediate rash, facial/tongue/throat swelling, SOB or lightheadedness with hypotension: No Has patient had a PCN reaction causing severe rash involving mucus membranes or skin necrosis: No Has patient had a PCN reaction that required hospitalization: No Has patient had a PCN reaction occurring within the last 10 years: No If all of the above   . Ciprofloxacin Diarrhea  . Molds & Smuts Rash  . Moxifloxacin Rash    Medications Prior to Admission  Medication Sig Dispense Refill  . acetaminophen (TYLENOL) 500 MG tablet Take 1,000 mg by mouth every 8 (eight) hours as needed for moderate pain.     Marland Kitchen ADVAIR DISKUS 100-50 MCG/DOSE AEPB USE 1 INHALATION INTO THE  LUNGS 2 TIMES DAILY (Patient taking differently: Inhale 1 puff into the lungs 2 (two) times daily. Wixela) 60 each 1  .  albuterol (PROVENTIL HFA;VENTOLIN HFA) 108 (90 Base) MCG/ACT inhaler Inhale 2 puffs into the lungs every 4 (four) hours as needed for wheezing or shortness of breath. 3 Inhaler 3  . aspirin EC 81 MG tablet Take 81 mg by mouth daily.    . Calcium-Magnesium-Vitamin D (CALCIUM 1200+D3 PO) Take 1 tablet by mouth daily.    . cetirizine (ZYRTEC) 10 MG tablet Take 10 mg by mouth daily.    . Coenzyme Q10 (COQ10) 100 MG CAPS Take 100 mg by mouth daily.    . Cyanocobalamin (VITAMIN B-12) 5000 MCG SUBL Place 5,000 mcg under the tongue daily.    . diphenoxylate-atropine (LOMOTIL) 2.5-0.025 MG tablet Take 2 tablets by mouth 2 (two) times daily as needed  (ulcerative colitis flare ups).     . ferrous sulfate 325 (65 FE) MG tablet Take 325 mg by mouth daily with breakfast.    . guaiFENesin (MUCINEX) 600 MG 12 hr tablet Take 600 mg by mouth daily.     . montelukast (SINGULAIR) 10 MG tablet TAKE 1 TABLET BY MOUTH  EVERY EVENING (Patient taking differently: Take 10 mg by mouth at bedtime. ) 90 tablet 0  . Multiple Vitamin (MULTIVITAMIN WITH MINERALS) TABS tablet Take 1 tablet by mouth daily. Centrum Silver    . pantoprazole (PROTONIX) 40 MG tablet Take 40 mg by mouth 2 (two) times daily.     . predniSONE (DELTASONE) 5 MG tablet Take 5 mg by mouth daily.    . simvastatin (ZOCOR) 20 MG tablet Take 20 mg by mouth every evening.     . tamsulosin (FLOMAX) 0.4 MG CAPS capsule Take 2 capsules (0.8 mg total) by mouth daily after supper. 90 capsule 1  . vedolizumab (ENTYVIO) 300 MG injection Inject 300 mg into the vein See admin instructions. Every 60 days    . guaifenesin (HUMIBID E) 400 MG TABS tablet Take 400 mg by mouth daily.    . naphazoline-pheniramine (VISINE-A) 0.025-0.3 % ophthalmic solution Place 1 drop into both eyes 4 (four) times daily as needed for eye irritation (itchy eye).      No results found for this or any previous visit (from the past 48 hour(s)). No results found.  ROS  Blood pressure 112/71, pulse 67, temperature (!) 97.1 F (36.2 C), temperature source Oral, resp. rate 13, height 5' 10"  (1.778 m), weight 77.1 kg, SpO2 97 %.   Physical Exam the patient is in no distress whatsoever.  He is anicteric without pallor.  Anteriorly, the chest is clear with good air movement, no evident respiratory distress, no wheezes.  Heart has regular rhythm and no obvious murmur.  Abdomen is minimally adipose, soft and without tenderness or mass-effect.  Assessment/Plan Recent severe upper abdominal pain of unclear cause, in the setting of chronic ulcerative colitis.  In addition, the patient has a one-year history of malaise ever since he was  treated for Pseudomonas chronic pneumonia with prolonged antibiotics, and a history of persistent severe eosinophilia, absolute eosinophil count on the order of 1200.  I plan to use an extended length scope to examine the third portion of the duodenum and obtain mucosal biopsies.  We will also do a quick, unprepped flex sig to make sure that there is not smoldering colitis to somehow be adding into the clinical picture.  Cleotis Nipper, MD 03/18/2019, 11:31 AM

## 2019-03-18 NOTE — Transfer of Care (Signed)
Immediate Anesthesia Transfer of Care Note  Patient: [name]  Procedure(s) Performed: ESOPHAGOGASTRODUODENOSCOPY (EGD) WITH PROPOFOL (N/A ) FLEXIBLE SIGMOIDOSCOPY (N/A ) BIOPSY  Patient Location: Endoscopy Unit  Anesthesia Type:MAC  Level of Consciousness: awake and patient cooperative  Airway & Oxygen Therapy: Patient Spontanous Breathing and Patient connected to nasal cannula oxygen  Post-op Assessment: Report given to RN and Post -op Vital signs reviewed and stable  Post vital signs: Reviewed and stable  Last Vitals:  Vitals Value Taken Time  BP 106/46 03/18/19 1221  Temp 36.8 C 03/18/19 1221  Pulse 67 03/18/19 1221  Resp 25 03/18/19 1221  SpO2 98 % 03/18/19 1221  Vitals shown include unvalidated device data.  Last Pain:  Vitals:   03/18/19 1221  TempSrc: Tympanic  PainSc: 0-No pain         Complications: No apparent anesthesia complications

## 2019-03-18 NOTE — Op Note (Signed)
[facility] Patient Name: [name] Procedure Date: 03/18/2019 MRN: [id number] Attending MD: Ronald Lobo , MD Date of Birth: 08-29-1940 CSN: [id number] Age: 78 Admit Type: Outpatient Procedure:                Upper GI endoscopy Indications:              Epigastric abdominal pain (now resolved) with                            Abnormal CT of the GI tract 1 week ago Providers:                Ronald Lobo, MD, Raynelle Bring, RN, Marguerita Merles, Technician Referring MD:              Medicines:                Monitored Anesthesia Care Complications:            No immediate complications. Estimated Blood Loss:     Estimated blood loss was minimal. Procedure:                Pre-Anesthesia Assessment:                           - Prior to the procedure, a History and Physical                            was performed, and patient medications and                            allergies were reviewed. The patient's tolerance of                            previous anesthesia was also reviewed. The risks                            and benefits of the procedure and the sedation                            options and risks were discussed with the patient.                            All questions were answered, and informed consent                            was obtained. Prior Anticoagulants: The patient has                            taken no previous anticoagulant or antiplatelet                            agents except for aspirin. ASA Grade Assessment:  III - A patient with severe systemic disease. After                            reviewing the risks and benefits, the patient was                            deemed in satisfactory condition to undergo the                            procedure.                           After obtaining informed consent, the endoscope was                            passed under direct vision.  Throughout the                            procedure, the patient's blood pressure, pulse, and                            oxygen saturations were monitored continuously. The                            Olympus ultraslim pediatric colonoscopy was used as                            an enteroscope for this procedure, to make sure we                            could reach the distal portion of the duodenum. It                            was introduced through the mouth, and advanced to                            the fourth part of duodenum. The upper GI endoscopy                            was accomplished without difficulty. The patient                            tolerated the procedure well. Scope In: 12:03:38 PM Scope Out: 12:07:39 PM Scope Withdrawal Time: 0 hours 1 minute 54 seconds  Total Procedure Duration: 0 hours 4 minutes 1 second  Findings:      Diffuse salmon-colored mucosa was present at the distal esophagus,       consistent with previously-biopsied Barrett's esophagus. The maximum       longitudinal extent of these esophageal mucosal changes was 2 cm in       length.      A 6 cm previously-noted hiatal hernia was found. The proximal extent of       the gastric folds (end of tubular esophagus) was 34 cm from the  incisors. The hiatal narrowing was 40 cm from the incisors. The Z-line       was 32 cm from the incisors.      The entire examined stomach was normal.      The cardia and gastric fundus were normal on retroflexion, although the       diaphragmatic hiatus was very patulous.      The third portion of the duodenum, fourth portion of the duodenum and       proximal duodenum were normal in appearance. Biopsies were taken with a       cold forceps for histology from the distal duodenum, corresponding to       the portion that had looked abnormal on the patient's CT scan about 10       days ago when he was symptomatic at that time. Impression:               -  Salmon-colored mucosa consistent with                            short-segment Barrett's esophagus.                           - 6 cm hiatal hernia.                           - Normal stomach.                           - Normal-appearing third portion of the duodenum,                            fourth portion of the duodenum and examined                            duodenum. Biopsied. No current endoscopic                            abnormalities to correlate with the patient's                            abnormal CT at time of abdominal pain a week ago. Moderate Sedation:      This patient was sedated with monitored anesthesia care, not moderate       sedation. Recommendation:           - Await pathology results.                           - Perform a flexible sigmoidoscopy for further                            evaluation of the rectum and sigmoid colon today.                           (FOR IMAGES FROM TODAY'S PROCEDURE, PLEASE SEE                            SEPARATE FLEXIBLE SIGMOIDOSCOPY REPORT) Procedure Code(s):        ---  Professional ---                           [phone], Esophagogastroduodenoscopy, flexible,                            transoral; with biopsy, single or multiple Diagnosis Code(s):        --- Professional ---                           K44.9, Diaphragmatic hernia without obstruction or                            gangrene                           K22.8, Other specified diseases of esophagus                           R10.13, Epigastric pain                           R93.3, Abnormal findings on diagnostic imaging of                            other parts of digestive tract CPT copyright 2019 American [employer]. All rights reserved. The codes documented in this report are preliminary and upon coder review may  be revised to meet current compliance requirements. Ronald Lobo, MD 03/18/2019 12:21:50 PM This report has been signed electronically. Number of  Addenda: 0

## 2019-03-18 NOTE — Anesthesia Postprocedure Evaluation (Signed)
Anesthesia Post Note  Patient: [name]  Procedure(s) Performed: ESOPHAGOGASTRODUODENOSCOPY (EGD) WITH PROPOFOL (N/A ) FLEXIBLE SIGMOIDOSCOPY (N/A ) BIOPSY     Patient location during evaluation: Endoscopy Anesthesia Type: MAC Level of consciousness: awake and alert Pain management: pain level controlled Vital Signs Assessment: post-procedure vital signs reviewed and stable Respiratory status: spontaneous breathing, nonlabored ventilation, respiratory function stable and patient connected to nasal cannula oxygen Cardiovascular status: blood pressure returned to baseline and stable Postop Assessment: no apparent nausea or vomiting Anesthetic complications: no    Last Vitals:  Vitals:   03/18/19 1300 03/18/19 1305  BP: 127/72   Pulse: (!) 57 (!) 57  Resp: 16 (!) 22  Temp:    SpO2: 99% 100%    Last Pain:  Vitals:   03/18/19 1305  TempSrc:   PainSc: 0-No pain                 Barnet Glasgow

## 2019-06-03 IMAGING — CR DG CHEST 2V
2 series · 2 of 2 positions shown · non-contrast
Comparison: 10/26/2015

CLINICAL DATA: Cough and body soreness.  Bronchoscopy 2 days ago.

EXAM:
CHEST - 2 VIEW

[x chest ap]
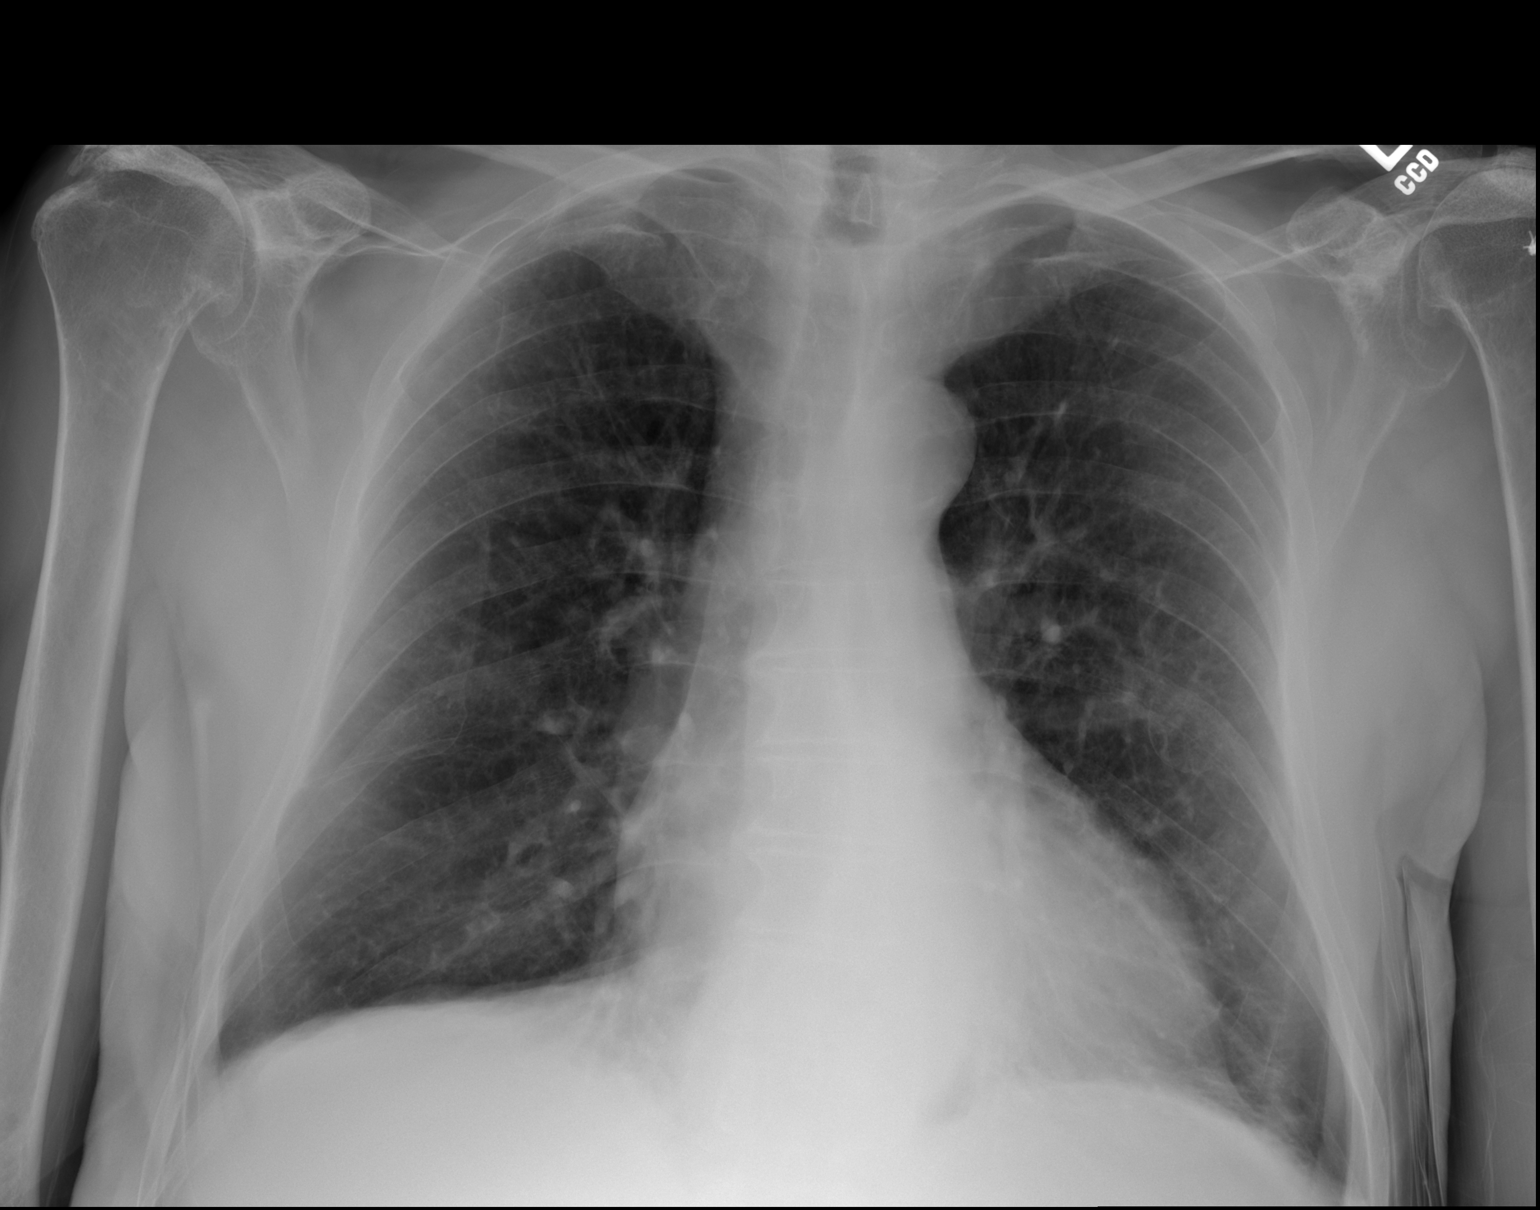

[w chest lat]
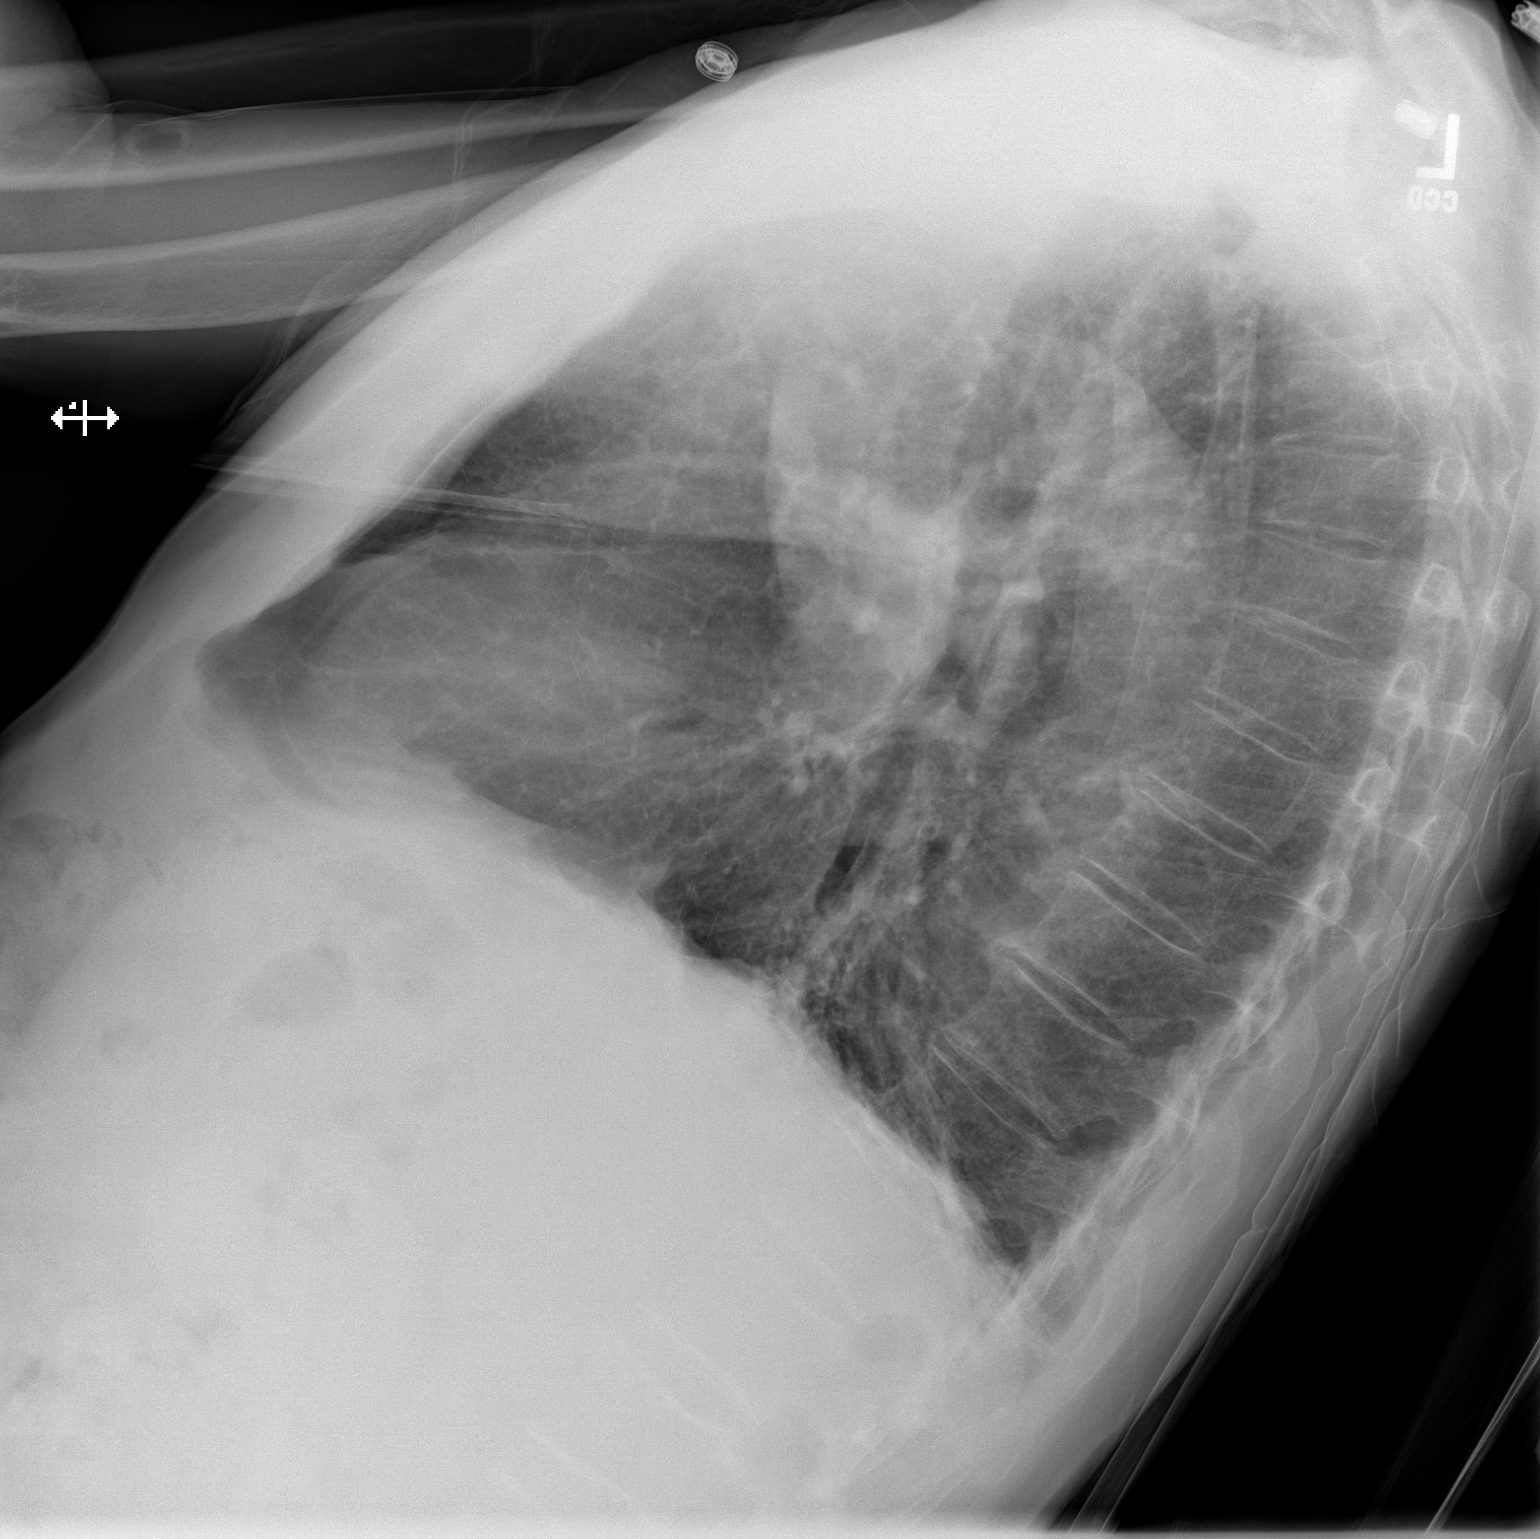

[2 of 2 positions shown; findings below may reference images not displayed]

FINDINGS: The cardiomediastinal silhouette is within normal limits. The lungs
are hyperinflated. There is mild streaky left basilar opacity,
partly chronic though perhaps slightly increased from the prior
study. The right lung is clear. No pleural effusion or pneumothorax
is identified. No acute osseous abnormality is seen.
IMPRESSION: Mild left basilar atelectasis or scarring, stable to slightly
increased.

## 2021-06-20 ENCOUNTER — Ambulatory Visit: Payer: Medicare Other | Attending: Internal Medicine

## 2021-06-20 ENCOUNTER — Other Ambulatory Visit (HOSPITAL_BASED_OUTPATIENT_CLINIC_OR_DEPARTMENT_OTHER): Payer: Self-pay

## 2021-06-20 ENCOUNTER — Other Ambulatory Visit: Payer: Self-pay

## 2021-06-20 DIAGNOSIS — Z23 Encounter for immunization: Secondary | ICD-10-CM

## 2021-06-20 MED ORDER — PFIZER COVID-19 VAC BIVALENT 30 MCG/0.3ML IM SUSP
INTRAMUSCULAR | 0 refills | Status: DC
  Filled 2021-06-20: qty 0.3, 1d supply, fill #0

## 2021-06-20 NOTE — Progress Notes (Signed)
   Covid-19 Vaccination Clinic  Name:  [name]    MRN: [id number] DOB: 11-Dec-1940  06/20/2021  [name] was observed post Covid-19 immunization for 15 minutes without incident. He was provided with Vaccine Information Sheet and instruction to access the V-Safe system.   [name] was instructed to call 911 with any severe reactions post vaccine: Difficulty breathing  Swelling of face and throat  A fast heartbeat  A bad rash all over body  Dizziness and weakness   Immunizations Administered     Name Date Dose VIS Date Route   Pfizer Covid-19 Vaccine Bivalent Booster 06/20/2021 11:12 AM 0.3 mL 04/06/2021 Intramuscular   Manufacturer: [place]   Lot: [id number]   [place]: [phone]

## 2021-10-23 ENCOUNTER — Emergency Department (HOSPITAL_BASED_OUTPATIENT_CLINIC_OR_DEPARTMENT_OTHER): Payer: Medicare Other

## 2021-10-23 ENCOUNTER — Emergency Department (HOSPITAL_BASED_OUTPATIENT_CLINIC_OR_DEPARTMENT_OTHER)
Admission: EM | Admit: 2021-10-23 | Discharge: 2021-10-23 | Disposition: A | Discharge status: Home / Self Care | Payer: Medicare Other | Attending: Emergency Medicine | Admitting: Emergency Medicine

## 2021-10-23 ENCOUNTER — Encounter (HOSPITAL_BASED_OUTPATIENT_CLINIC_OR_DEPARTMENT_OTHER): Payer: Self-pay | Admitting: Emergency Medicine

## 2021-10-23 ENCOUNTER — Other Ambulatory Visit: Payer: Self-pay

## 2021-10-23 DIAGNOSIS — M25462 Effusion, left knee: Secondary | ICD-10-CM | POA: Insufficient documentation

## 2021-10-23 DIAGNOSIS — Z96651 Presence of right artificial knee joint: Secondary | ICD-10-CM | POA: Diagnosis not present

## 2021-10-23 DIAGNOSIS — J449 Chronic obstructive pulmonary disease, unspecified: Secondary | ICD-10-CM | POA: Insufficient documentation

## 2021-10-23 DIAGNOSIS — M25562 Pain in left knee: Secondary | ICD-10-CM | POA: Diagnosis present

## 2021-10-23 DIAGNOSIS — Z7982 Long term (current) use of aspirin: Secondary | ICD-10-CM | POA: Insufficient documentation

## 2021-10-23 DIAGNOSIS — J45909 Unspecified asthma, uncomplicated: Secondary | ICD-10-CM | POA: Insufficient documentation

## 2021-10-23 DIAGNOSIS — Z7952 Long term (current) use of systemic steroids: Secondary | ICD-10-CM | POA: Diagnosis not present

## 2021-10-23 MED ORDER — OXYCODONE HCL 5 MG PO TABS: 5.0000 mg | ORAL_TABLET | ORAL | 0 refills | Status: DC | PRN

## 2021-10-23 MED ORDER — BUPIVACAINE HCL 0.5 % IJ SOLN
50.0000 mL | Freq: Once | INTRAMUSCULAR | Status: DC
  Filled 2021-10-23: qty 1

## 2021-10-23 MED ORDER — HYDROCODONE-ACETAMINOPHEN 5-325 MG PO TABS
1.0000 | ORAL_TABLET | Freq: Once | ORAL | Status: AC
  Administered 2021-10-23: 1 via ORAL
  Filled 2021-10-23: qty 1

## 2021-10-23 NOTE — Discharge Instructions (Addendum)
Keep knee elevated, use compression dressing, utilize NSAIDs.  Follow-up with orthopedics ? ?It was a pleasure caring for you today in the emergency department. ? ?Please return to the emergency department for any worsening or worrisome symptoms. ? ?

## 2021-10-23 NOTE — ED Provider Notes (Signed)
?[facility] ?Provider Note ? ? ?CSN: [id number] ?Arrival date & time: 10/23/21  0947 ? ?  ? ?History ? ?Chief Complaint  ?Patient presents with  ? Knee Pain  ? ? ?[name] is a 81 y.o. male. ? ?This is a 81 y.o. male  with significant medical history as below, including arthritis, COPD, bronchitis, hyperlipidemia, right knee replacement who presents to the ED with complaint of L knee pain. ? ?Location: Left knee, superior greater than inferior ?Duration: 12 hours ?Onset: Gradual ?Timing: Constant ?Description: Aching, fullness to the knee ?Severity: Mild ?Exacerbating/Alleviating Factors: Worse with ambulation weightbearing ?Associated Symptoms: Difficulty walking secondary to discomfort ?Pertinent Negatives: No fevers, chills, nausea, vomiting, trauma, hip or ankle pain.  No rash or warmth to the knee ? ?Context: Progressive swelling of left knee since yesterday.  Reports is happened multiple times in the past, orthopedic specialist has previously drained the knee the office. ? ? ? ?Past Medical History: ?No date: Arthritis ?    Comment:  "joints" (05/09/2018) ?No date: Asthma ?No date: Chronic bronchitis ?No date: COPD (chronic obstructive pulmonary disease) ([place]) ?No date: GERD (gastroesophageal reflux disease) ?No date: Hearing loss in left ear ?No date: High cholesterol ?No date: History of hiatal hernia ?No date: Lymphedema of left leg ?    Comment:  "foot" (05/09/2018) ?No date: Peripheral edema ?04/2018: Pneumonia ?No date: Sinusitis ?No date: Ulcerative colitis ? ?Past Surgical History: ?03/18/2019: BIOPSY ?    Comment:  Procedure: BIOPSY;  Surgeon: Ronald Lobo, MD;   ?             Location: [facility];  Service: Endoscopy;;  Egd and  ?             Flex ?No date: CATARACT EXTRACTION W/ INTRAOCULAR LENS  IMPLANT, BILATERAL;  ?Bilateral ?No date: COLONOSCOPY W/ BIOPSIES AND POLYPECTOMY ?    Comment:  several with polys removed ?03/18/2019: ESOPHAGOGASTRODUODENOSCOPY  (EGD) WITH PROPOFOL; N/A ?    Comment:  Procedure: ESOPHAGOGASTRODUODENOSCOPY (EGD) WITH  ?             PROPOFOL;  Surgeon: Ronald Lobo, MD;  Location: [facility]  ?             ENDOSCOPY;  Service: Endoscopy;  Laterality: N/A;  Will  ?             need enteroscope  ?03/18/2019: FLEXIBLE SIGMOIDOSCOPY; N/A ?    Comment:  Procedure: FLEXIBLE SIGMOIDOSCOPY;  Surgeon: Buccini,  ?             Herbie Baltimore, MD;  Location: [facility];  Service: Endoscopy; ?             Laterality: N/A;  Unprepped ?2013: HERNIA REPAIR ?    Comment:  x 3 hernias repaired ?No date: JOINT REPLACEMENT ?No date: SHOULDER OPEN ROTATOR CUFF REPAIR; Bilateral ?08/13/2015: TOTAL KNEE ARTHROPLASTY; Right ?    Comment:  Procedure: RIGHT TOTAL KNEE ARTHROPLASTY;  Surgeon:  ?             Gaynelle Arabian, MD;  Location: [facility];  Service:  ?             Orthopedics;  Laterality: Right;  ? ? ?The history is provided by the patient. No language interpreter was used.  ?Knee Pain ?Associated symptoms: no fever   ? ?  ? ?Home Medications ?Prior to Admission medications   ?Medication Sig Start Date End Date Taking? Authorizing Provider  ?oxyCODONE (ROXICODONE) 5 MG immediate  release tablet Take 1 tablet (5 mg total) by mouth every 4 (four) hours as needed for up to 5 doses for severe pain. 10/23/21  Yes Wynona Dove A, DO  ?acetaminophen (TYLENOL) 500 MG tablet Take 1,000 mg by mouth every 8 (eight) hours as needed for moderate pain.     [provider]  ?ADVAIR DISKUS 100-50 MCG/DOSE AEPB USE 1 INHALATION INTO THE  LUNGS 2 TIMES DAILY ?Patient taking differently: Inhale 1 puff into the lungs 2 (two) times daily. Wixela 07/10/16   Baird Lyons D, MD  ?albuterol (PROVENTIL HFA;VENTOLIN HFA) 108 (90 Base) MCG/ACT inhaler Inhale 2 puffs into the lungs every 4 (four) hours as needed for wheezing or shortness of breath. 02/25/16   Baird Lyons D, MD  ?aspirin EC 81 MG tablet Take 81 mg by mouth daily.    [provider]  ?Calcium-Magnesium-Vitamin D (CALCIUM  1200+D3 PO) Take 1 tablet by mouth daily.    [provider]  ?cetirizine (ZYRTEC) 10 MG tablet Take 10 mg by mouth daily.    [provider]  ?Coenzyme Q10 (COQ10) 100 MG CAPS Take 100 mg by mouth daily.    [provider]  ?COVID-19 mRNA bivalent vaccine, Pfizer, (PFIZER COVID-19 VAC BIVALENT) injection Inject into the muscle. 06/20/21   Carlyle Basques, MD  ?Cyanocobalamin (VITAMIN B-12) 5000 MCG SUBL Place 5,000 mcg under the tongue daily.    [provider]  ?diphenoxylate-atropine (LOMOTIL) 2.5-0.025 MG tablet Take 2 tablets by mouth 2 (two) times daily as needed (ulcerative colitis flare ups).     [provider]  ?ferrous sulfate 325 (65 FE) MG tablet Take 325 mg by mouth daily with breakfast.    [provider]  ?guaifenesin (HUMIBID E) 400 MG TABS tablet Take 400 mg by mouth daily.    [provider]  ?guaiFENesin (MUCINEX) 600 MG 12 hr tablet Take 600 mg by mouth daily.     [provider]  ?montelukast (SINGULAIR) 10 MG tablet TAKE 1 TABLET BY MOUTH  EVERY EVENING ?Patient taking differently: Take 10 mg by mouth at bedtime.  01/29/17   Deneise Lever, MD  ?Multiple Vitamin (MULTIVITAMIN WITH MINERALS) TABS tablet Take 1 tablet by mouth daily. Centrum Silver    [provider]  ?naphazoline-pheniramine (VISINE-A) 0.025-0.3 % ophthalmic solution Place 1 drop into both eyes 4 (four) times daily as needed for eye irritation (itchy eye).    [provider]  ?pantoprazole (PROTONIX) 40 MG tablet Take 40 mg by mouth 2 (two) times daily.     [provider]  ?predniSONE (DELTASONE) 5 MG tablet Take 5 mg by mouth daily. 02/16/19   [provider]  ?simvastatin (ZOCOR) 20 MG tablet Take 20 mg by mouth every evening.     [provider]  ?tamsulosin (FLOMAX) 0.4 MG CAPS capsule Take 2 capsules (0.8 mg total) by mouth daily after supper. 08/14/15   Gaynelle Arabian, MD  ?vedolizumab (ENTYVIO) 300 MG  injection Inject 300 mg into the vein See admin instructions. Every 60 days    [provider]  ?   ? ?Allergies    ?Lipitor [atorvastatin calcium], Penicillins, Ciprofloxacin, Molds & smuts, and Moxifloxacin   ? ?Review of Systems   ?Review of Systems  ?Constitutional:  Negative for chills and fever.  ?HENT:  Negative for facial swelling and trouble swallowing.   ?Eyes:  Negative for photophobia and visual disturbance.  ?Respiratory:  Negative for cough and shortness of breath.   ?Cardiovascular:  Negative for chest pain and palpitations.  ?Gastrointestinal:  Negative for abdominal pain, nausea and vomiting.  ?Endocrine: Negative for polydipsia and polyuria.  ?Genitourinary:  Negative for difficulty urinating and hematuria.  ?Musculoskeletal:  Positive for arthralgias and gait problem. Negative for joint swelling.  ?Skin:  Negative for pallor and rash.  ?Neurological:  Negative for syncope and headaches.  ?Psychiatric/Behavioral:  Negative for agitation and confusion.   ? ?Physical Exam ?Updated Vital Signs ?BP (!) 142/85 (BP Location: Left Arm)   Pulse 82   Temp 98 ?F (36.7 ?C) (Oral)   Resp 18   Ht 5' 10"  (1.778 m)   Wt 70.3 kg   SpO2 100%   BMI 22.24 kg/m?  ?Physical Exam ?Vitals and nursing note reviewed.  ?Constitutional:   ?   General: He is not in acute distress. ?   Appearance: He is well-developed.  ?HENT:  ?   Head: Normocephalic and atraumatic.  ?   Right Ear: External ear normal.  ?   Left Ear: External ear normal.  ?   Mouth/Throat:  ?   Mouth: Mucous membranes are moist.  ?Eyes:  ?   General: No scleral icterus. ?Cardiovascular:  ?   Rate and Rhythm: Normal rate and regular rhythm.  ?   Pulses: Normal pulses.  ?   Heart sounds: Normal heart sounds.  ?Pulmonary:  ?   Effort: Pulmonary effort is normal. No respiratory distress.  ?   Breath sounds: Normal breath sounds.  ?Abdominal:  ?   General: Abdomen is flat.  ?   Palpations: Abdomen is soft.  ?   Tenderness: There is no abdominal  tenderness.  ?Musculoskeletal:     ?   General: Normal range of motion.  ?   Cervical back: Normal range of motion.  ?   Right lower leg: No edema.  ?   Left lower leg: No edema.  ?     Legs: ? ?   Comments: Distal puls

## 2021-10-23 NOTE — ED Triage Notes (Signed)
Patient arrives ambulatory with one crutch c/o left knee pain. States pain kept him up all night. Reports his ortho doc usually will drain some fluid and give him meds when pain flares up.  ?

## 2022-07-27 ENCOUNTER — Other Ambulatory Visit: Payer: Self-pay | Admitting: Orthopedic Surgery

## 2022-08-18 NOTE — Progress Notes (Signed)
PCP - Anastasia Pall, MD lov/ clearance 07-18-22 epic Cardiologist - no Infectious disease LOV 07-10-22 epic Dr. Edythe Clarity -Dr. Kandis Fantasia 04-04-22  PPM/ICD -  Device Orders -  Rep Notified -   Chest x-ray - CT chest 08-10-22 care everywhere EKG - 07-18-22 CE  requested Stress Test -  ECHO -  Cardiac Cath -   Sleep Study -  CPAP -   Fasting Blood Sugar -  Checks Blood Sugar _____ times a day  Blood Thinner Instructions: Aspirin Instructions:  ERAS Protcol - PRE-SURGERY Ensure   COVID TEST-  COVID vaccine -yes  Activity--Able to climb a flight of stairs and complete ADL's without SOB Anesthesia review: Asthma, COPD, Mycobacterium avium complex  Patient denies shortness of breath, fever, cough and chest pain at PAT appointment   All instructions explained to the patient, with a verbal understanding of the material. Patient agrees to go over the instructions while at home for a better understanding. Patient also instructed to self quarantine after being tested for COVID-19. The opportunity to ask questions was provided.

## 2022-08-18 NOTE — Patient Instructions (Signed)
SURGICAL WAITING ROOM VISITATION  Patients having surgery or a procedure may have no more than 2 support people in the waiting area - these visitors may rotate.    Children under the age of 7 must have an adult with them who is not the patient.  Due to an increase in RSV and influenza rates and associated hospitalizations, children ages 1 and under may not visit patients in [place].  If the patient needs to stay at the hospital during part of their recovery, the visitor guidelines for inpatient rooms apply. Pre-op nurse will coordinate an appropriate time for 1 support person to accompany patient in pre-op.  This support person may not rotate.    Please refer to the [facility] website for the visitor guidelines for Inpatients (after your surgery is over and you are in a regular room).       Your procedure is scheduled on: 09-04-22   Report to [facility] Main Entrance    Report to admitting at       0800  AM   Call this number if you have problems the morning of surgery [phone]   Do not eat food :After Midnight.   After Midnight you may have the following liquids until 0730______ AM  DAY OF SURGERY   then nothing by mouth  Water Non-Citrus Juices (without pulp, NO RED-Apple, White grape, White cranberry) Black Coffee (NO MILK/CREAM OR CREAMERS, sugar ok)  Clear Tea (NO MILK/CREAM OR CREAMERS, sugar ok) regular and decaf                             Plain Jell-O (NO RED)                                           Fruit ices (not with fruit pulp, NO RED)                                     Popsicles (NO RED)                                                               Sports drinks like Gatorade (NO RED)                    The day of surgery:  Drink ONE (1) Pre-Surgery Clear Ensure  at      0700 AM the morning of surgery. Drink in one sitting. Do not sip.  This drink was given to you during your hospital  pre-op appointment visit. Nothing else to  drink after completing the  Pre-Surgery Clear Ensure          If you have questions, please contact your surgeon's office.   FOLLOW  ANY ADDITIONAL PRE OP INSTRUCTIONS YOU RECEIVED FROM YOUR SURGEON'S OFFICE!!!     Oral Hygiene is also important to reduce your risk of infection.  Remember - BRUSH YOUR TEETH THE MORNING OF SURGERY WITH YOUR REGULAR TOOTHPASTE  DENTURES WILL BE REMOVED PRIOR TO SURGERY PLEASE DO NOT APPLY "Poly grip" OR ADHESIVES!!!   Do NOT smoke after Midnight   Take these medicines the morning of surgery with A SIP OF WATER: inhaler and bring rescue inhaler with you, silodosin, loratadine, mucinex, finasteride  DO NOT TAKE ANY ORAL DIABETIC MEDICATIONS DAY OF YOUR SURGERY  Bring CPAP mask and tubing day of surgery.                              You may not have any metal on your body including hair pins, jewelry, and body piercing             Do not wear lotions, powders, perfumes/cologne, or deodorant               Men may shave face and neck.   Do not bring valuables to the hospital. [place].   Contacts, glasses, dentures or bridgework may not be worn into surgery.   Bring small overnight bag day of surgery.   DO NOT [place]. PHARMACY WILL DISPENSE MEDICATIONS LISTED ON YOUR MEDICATION LIST TO YOU DURING YOUR ADMISSION [place]!    Patients discharged on the day of surgery will not be allowed to drive home.  Someone NEEDS to stay with you for the first 24 hours after anesthesia.   Special Instructions: Bring a copy of your healthcare power of attorney and living will documents the day of surgery if you haven't scanned them before.              Please read over the following fact sheets you were given: IF [place] [phone]   If you received a COVID test during your pre-op  visit  it is requested that you wear a mask when out in public, stay away from anyone that may not be feeling well and notify your surgeon if you develop symptoms. If you test positive for Covid or have been in contact with anyone that has tested positive in the last 10 days please notify you surgeon.    [place] - Preparing for Surgery Before surgery, you can play an important role.  Because skin is not sterile, your skin needs to be as free of germs as possible.  You can reduce the number of germs on your skin by washing with CHG (chlorahexidine gluconate) soap before surgery.  CHG is an antiseptic cleaner which kills germs and bonds with the skin to continue killing germs even after washing. Please DO NOT use if you have an allergy to CHG or antibacterial soaps.  If your skin becomes reddened/irritated stop using the CHG and inform your nurse when you arrive at Short Stay. Do not shave (including legs and underarms) for at least 48 hours prior to the first CHG shower.  You may shave your face/neck. Please follow these instructions carefully:  1.  Shower with CHG Soap the night before surgery and the  morning of Surgery.  2.  If you choose to wash your hair, wash your hair first as usual with your  normal  shampoo.  3.  After you shampoo, rinse your hair and body thoroughly to remove  the  shampoo.                           4.  Use CHG as you would any other liquid soap.  You can apply chg directly  to the skin and wash                       Gently with a scrungie or clean washcloth.  5.  Apply the CHG Soap to your body ONLY FROM THE NECK DOWN.   Do not use on face/ open                           Wound or open sores. Avoid contact with eyes, ears mouth and genitals (private parts).                       Wash face,  Genitals (private parts) with your normal soap.             6.  Wash thoroughly, paying special attention to the area where your surgery  will be performed.  7.  Thoroughly rinse your  body with warm water from the neck down.  8.  DO NOT shower/wash with your normal soap after using and rinsing off  the CHG Soap.                9.  Pat yourself dry with a clean towel.            10.  Wear clean pajamas.            11.  Place clean sheets on your bed the night of your first shower and do not  sleep with pets. Day of Surgery : Do not apply any lotions/deodorants the morning of surgery.  Please wear clean clothes to the hospital/surgery center.  FAILURE TO FOLLOW THESE INSTRUCTIONS MAY RESULT IN THE CANCELLATION OF YOUR SURGERY PATIENT SIGNATURE_________________________________  NURSE SIGNATURE__________________________________  ________________________________________________________________________  Adam Phenix  An incentive spirometer is a tool that can help keep your lungs clear and active. This tool measures how well you are filling your lungs with each breath. Taking long deep breaths may help reverse or decrease the chance of developing breathing (pulmonary) problems (especially infection) following: A long period of time when you are unable to move or be active. BEFORE THE PROCEDURE  If the spirometer includes an indicator to show your best effort, your nurse or respiratory therapist will set it to a desired goal. If possible, sit up straight or lean slightly forward. Try not to slouch. Hold the incentive spirometer in an upright position. INSTRUCTIONS FOR USE  Sit on the edge of your bed if possible, or sit up as far as you can in bed or on a chair. Hold the incentive spirometer in an upright position. Breathe out normally. Place the mouthpiece in your mouth and seal your lips tightly around it. Breathe in slowly and as deeply as possible, raising the piston or the ball toward the top of the column. Hold your breath for 3-5 seconds or for as long as possible. Allow the piston or ball to fall to the bottom of the column. Remove the mouthpiece from your  mouth and breathe out normally. Rest for a few seconds and repeat Steps 1 through 7 at least 10 times every 1-2 hours when you are awake. Take your time and  take a few normal breaths between deep breaths. The spirometer may include an indicator to show your best effort. Use the indicator as a goal to work toward during each repetition. After each set of 10 deep breaths, practice coughing to be sure your lungs are clear. If you have an incision (the cut made at the time of surgery), support your incision when coughing by placing a pillow or rolled up towels firmly against it. Once you are able to get out of bed, walk around indoors and cough well. You may stop using the incentive spirometer when instructed by your caregiver.  RISKS AND COMPLICATIONS Take your time so you do not get dizzy or light-headed. If you are in pain, you may need to take or ask for pain medication before doing incentive spirometry. It is harder to take a deep breath if you are having pain. AFTER USE Rest and breathe slowly and easily. It can be helpful to keep track of a log of your progress. Your caregiver can provide you with a simple table to help with this. If you are using the spirometer at home, follow these instructions: SEEK MEDICAL CARE IF:  You are having difficultly using the spirometer. You have trouble using the spirometer as often as instructed. Your pain medication is not giving enough relief while using the spirometer. You develop fever of 100.5 F (38.1 C) or higher. SEEK IMMEDIATE MEDICAL CARE IF:  You cough up bloody sputum that had not been present before. You develop fever of 102 F (38.9 C) or greater. You develop worsening pain at or near the incision site. MAKE SURE YOU:  Understand these instructions. Will watch your condition. Will get help right away if you are not doing well or get worse. Document Released: 12/04/2006 Document Revised: 10/16/2011 Document Reviewed: 02/04/2007 [facility]  Patient Information 2014 [place], [place].   ________________________________________________________________________

## 2022-08-22 ENCOUNTER — Encounter (HOSPITAL_COMMUNITY): Payer: Self-pay

## 2022-08-22 ENCOUNTER — Other Ambulatory Visit: Payer: Self-pay

## 2022-08-22 ENCOUNTER — Encounter (HOSPITAL_COMMUNITY)
Admission: RE | Admit: 2022-08-22 | Discharge: 2022-08-22 | Disposition: A | Discharge status: Home / Self Care | Payer: Medicare Other | Source: Ambulatory Visit | Attending: Orthopedic Surgery | Admitting: Orthopedic Surgery

## 2022-08-22 VITALS — BP 110/71 | HR 75 | Temp 98.5°F | Resp 16 | Ht 70.0 in | Wt 167.0 lb

## 2022-08-22 DIAGNOSIS — Z01818 Encounter for other preprocedural examination: Secondary | ICD-10-CM

## 2022-08-22 DIAGNOSIS — Z01812 Encounter for preprocedural laboratory examination: Secondary | ICD-10-CM | POA: Diagnosis not present

## 2022-08-22 DIAGNOSIS — Z79899 Other long term (current) drug therapy: Secondary | ICD-10-CM | POA: Insufficient documentation

## 2022-08-22 HISTORY — DX: Pulmonary mycobacterial infection: A31.0

## 2022-08-22 HISTORY — DX: Depression, unspecified: F32.A

## 2022-08-22 LAB — CBC
HCT: 42.8 % (ref 39.0–52.0)
Hemoglobin: 13.9 g/dL (ref 13.0–17.0)
MCH: 29 pg (ref 26.0–34.0)
MCHC: 32.5 g/dL (ref 30.0–36.0)
MCV: 89.2 fL (ref 80.0–100.0)
Platelets: 268 10*3/uL (ref 150–400)
RBC: 4.8 MIL/uL (ref 4.22–5.81)
RDW: 13.2 % (ref 11.5–15.5)
WBC: 12.2 10*3/uL — ABNORMAL HIGH (ref 4.0–10.5)
nRBC: 0 % (ref 0.0–0.2)

## 2022-08-22 LAB — BASIC METABOLIC PANEL
Anion gap: 8 (ref 5–15)
BUN: 21 mg/dL (ref 8–23)
CO2: 23 mmol/L (ref 22–32)
Calcium: 9.1 mg/dL (ref 8.9–10.3)
Chloride: 105 mmol/L (ref 98–111)
Creatinine, Ser: 0.92 mg/dL (ref 0.61–1.24)
GFR, Estimated: >60 mL/min (ref >60)
Glucose, Bld: 89 mg/dL (ref 70–99)
Potassium: 4.7 mmol/L (ref 3.5–5.1)
Sodium: 136 mmol/L (ref 135–145)

## 2022-08-22 LAB — SURGICAL PCR SCREEN
MRSA, PCR: NEGATIVE
Staphylococcus aureus: NEGATIVE

## 2022-08-25 NOTE — Progress Notes (Signed)
Anesthesia Chart Review   Case: [id number] Date/Time: 09/04/22 1018   Procedure: LEFT TOTAL KNEE ARTHROPLASTY (Left: Knee)   Anesthesia type: Spinal   Pre-op diagnosis: LEFT KNEE OSTEOARTHRITIS VARUS   Location: [place] 06 / [facility]   Surgeons: Frederik Pear, MD       DISCUSSION:82 y.o. former smoker with h/o asthma, COPD, left knee OA scheduled for above procedure 09/04/2022 with Dr. Frederik Pear.   Pt follows with pulmonology for asthma and pulmonary MAC, last seen 04/04/2022. Per OV note stable, shortness of breath at baseline.   Pt last seen by PCP 07/18/2022. Per OV note, "Pt is low risk for surgery."  Anticipate pt can proceed with planned procedure barring acute status change.   VS: BP 110/71   Pulse 75   Temp 36.9 C (Oral)   Resp 16   Ht _0  (1.778 m)   Wt 75.8 kg   SpO2 97%   BMI 23.96 kg/m   PROVIDERS: Chesley Noon, MD is PCP   Dionne Milo, MD [facility] is Pulmonologist  LABS: Labs reviewed: Acceptable for surgery. (all labs ordered are listed, but only abnormal results are displayed)  Labs Reviewed  CBC - Abnormal; Notable for the following components:      Result Value   WBC 12.2 (*)    All other components within normal limits  SURGICAL PCR SCREEN  BASIC METABOLIC PANEL     IMAGES:   EKG:   CV:  Past Medical History:  Diagnosis Date   Arthritis    "joints" (05/09/2018)   Asthma    Chronic bronchitis    COPD (chronic obstructive pulmonary disease) (HCC)    Depression    GERD (gastroesophageal reflux disease)    Hearing loss in left ear    High cholesterol    History of hiatal hernia    Lymphedema of left leg    "foot" (05/09/2018)   Mycobacterium avium complex ([place])    Peripheral edema    Pneumonia 04/2018   pt. denies   Sinusitis    Ulcerative colitis     Past Surgical History:  Procedure Laterality Date   BIOPSY  03/18/2019   Procedure: BIOPSY;  Surgeon: Ronald Lobo, MD;  Location: [facility];  Service: Endoscopy;;  Egd  and Flex   CATARACT EXTRACTION W/ INTRAOCULAR LENS  IMPLANT, BILATERAL Bilateral    COLONOSCOPY W/ BIOPSIES AND POLYPECTOMY     several with polys removed   ESOPHAGOGASTRODUODENOSCOPY (EGD) WITH PROPOFOL N/A 03/18/2019   Procedure: ESOPHAGOGASTRODUODENOSCOPY (EGD) WITH PROPOFOL;  Surgeon: Ronald Lobo, MD;  Location: [facility];  Service: Endoscopy;  Laterality: N/A;  Will need enteroscope    FLEXIBLE SIGMOIDOSCOPY N/A 03/18/2019   Procedure: FLEXIBLE SIGMOIDOSCOPY;  Surgeon: Ronald Lobo, MD;  Location: [facility];  Service: Endoscopy;  Laterality: N/A;  Unprepped   HERNIA REPAIR  2013   x 3 hernias repaired   JOINT REPLACEMENT     SHOULDER OPEN ROTATOR CUFF REPAIR Bilateral    TOTAL KNEE ARTHROPLASTY Right 08/13/2015   Procedure: RIGHT TOTAL KNEE ARTHROPLASTY;  Surgeon: Gaynelle Arabian, MD;  Location: [facility];  Service: Orthopedics;  Laterality: Right;    MEDICATIONS:  pantoprazole (PROTONIX) 40 MG tablet   acetaminophen (TYLENOL) 500 MG tablet   albuterol (PROVENTIL HFA;VENTOLIN HFA) 108 (90 Base) MCG/ACT inhaler   aspirin EC 81 MG tablet   Calcium Citrate-Vitamin D (CALCIUM + D PO)   cetirizine (ZYRTEC) 10 MG tablet   Coenzyme Q10 (COQ-10 PO)   Cyanocobalamin (B-12 SL)  diphenoxylate-atropine (LOMOTIL) 2.5-0.025 MG tablet   Ferrous Sulfate (IRON PO)   finasteride (PROSCAR) 5 MG tablet   fluticasone (FLONASE) 50 MCG/ACT nasal spray   fluticasone-salmeterol (ADVAIR) 250-50 MCG/ACT AEPB   FOLIC ACID PO   guaiFENesin (MUCINEX) 600 MG 12 hr tablet   loratadine (CLARITIN) 10 MG tablet   montelukast (SINGULAIR) 10 MG tablet   naphazoline-pheniramine (VISINE-A) 0.025-0.3 % ophthalmic solution   silodosin (RAPAFLO) 8 MG CAPS capsule   simvastatin (ZOCOR) 20 MG tablet   vedolizumab (ENTYVIO) 300 MG injection   VITAMIN D PO   No current facility-administered medications for this encounter.     Konrad Felix Ward, PA-C [facility] Pre-Surgical Testing [phone]

## 2022-08-31 NOTE — H&P (Signed)
TOTAL KNEE ADMISSION H&P  Patient is being admitted for left total knee arthroplasty.  Subjective:  Chief Complaint:left knee pain.  HPI: [name], 82 y.o. male, has a history of pain and functional disability in the left knee due to arthritis and has failed non-surgical conservative treatments for greater than 12 weeks to includeNSAID's and/or analgesics, corticosteriod injections, flexibility and strengthening excercises, use of assistive devices, weight reduction as appropriate, and activity modification.  Onset of symptoms was gradual, starting  several  years ago with gradually worsening course since that time. The patient noted no past surgery on the left knee(s).  Patient currently rates pain in the left knee(s) at 10 out of 10 with activity. Patient has night pain, worsening of pain with activity and weight bearing, pain that interferes with activities of daily living, pain with passive range of motion, and joint swelling.  Patient has evidence of joint subluxation and joint space narrowing by imaging studies.  There is no active infection.  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 ([place]) 38/75/6433   Asthma 05/09/2018   OA (osteoarthritis) of knee 08/13/2015   Chronic bronchitis with acute exacerbation ([employer]) 09/23/2013   Atherosclerosis of native arteries of the extremities with intermittent claudication 06/18/2012   Pain in limb 06/18/2012   Claudication ([place]) 05/30/2012   Frequency of urination 03/13/2012   General medical examination 05/29/2011   Myalgia 04/04/2011   DERMATOPHYTOSIS OF NAIL 09/22/2010   INGUINAL HERNIA, LEFT 09/22/2010   DECREASED HEARING 12/23/2009   RHINITIS 12/23/2009   UNSPECIFIED DISORDER OF SKIN&SUBCUTANEOUS TISSUE 12/23/2009   Hyperlipidemia 09/13/2009   LYMPHEDEMA 08/27/2009   ELEVATED BLOOD PRESSURE WITHOUT DIAGNOSIS OF HYPERTENSION 08/27/2009   BENIGN PROSTATIC  HYPERTROPHY, MILD, HX OF 08/27/2009   OTHER CHRONIC SINUSITIS 08/09/2007   BRONCHITIS, CHRONIC 08/09/2007   HIATAL HERNIA 08/09/2007   Ulcerative colitis ([place]) 08/09/2007   Past Medical History:  Diagnosis Date   Arthritis    "joints" (05/09/2018)   Asthma    Chronic bronchitis    COPD (chronic obstructive pulmonary disease) (HCC)    Depression    GERD (gastroesophageal reflux disease)    Hearing loss in left ear    High cholesterol    History of hiatal hernia    Lymphedema of left leg    "foot" (05/09/2018)   Mycobacterium avium complex ([place])    Peripheral edema    Pneumonia 04/2018   pt. denies   Sinusitis    Ulcerative colitis     Past Surgical History:  Procedure Laterality Date   BIOPSY  03/18/2019   Procedure: BIOPSY;  Surgeon: Ronald Lobo, MD;  Location: [facility];  Service: Endoscopy;;  Egd and Flex   CATARACT EXTRACTION W/ INTRAOCULAR LENS  IMPLANT, BILATERAL Bilateral    COLONOSCOPY W/ BIOPSIES AND POLYPECTOMY     several with polys removed   ESOPHAGOGASTRODUODENOSCOPY (EGD) WITH PROPOFOL N/A 03/18/2019   Procedure: ESOPHAGOGASTRODUODENOSCOPY (EGD) WITH PROPOFOL;  Surgeon: Ronald Lobo, MD;  Location: [facility];  Service: Endoscopy;  Laterality: N/A;  Will need enteroscope    FLEXIBLE SIGMOIDOSCOPY N/A 03/18/2019   Procedure: FLEXIBLE SIGMOIDOSCOPY;  Surgeon: Ronald Lobo, MD;  Location: [facility];  Service: Endoscopy;  Laterality: N/A;  Unprepped   HERNIA REPAIR  2013   x 3 hernias repaired   JOINT REPLACEMENT     SHOULDER OPEN ROTATOR CUFF REPAIR Bilateral    TOTAL KNEE ARTHROPLASTY Right 08/13/2015   Procedure: RIGHT TOTAL KNEE ARTHROPLASTY;  Surgeon: Gaynelle Arabian, MD;  Location: [facility];  Service: Orthopedics;  Laterality: Right;    No current facility-administered medications for this encounter.   Current Outpatient Medications  Medication Sig Dispense Refill Last Dose   acetaminophen (TYLENOL) 500 MG tablet Take 500 mg by mouth every 8  (eight) hours as needed for moderate pain.      albuterol (PROVENTIL HFA;VENTOLIN HFA) 108 (90 Base) MCG/ACT inhaler Inhale 2 puffs into the lungs every 4 (four) hours as needed for wheezing or shortness of breath. 3 Inhaler 3    aspirin EC 81 MG tablet Take 81 mg by mouth daily.      Calcium Citrate-Vitamin D (CALCIUM + D PO) Take 1 tablet by mouth daily.      Coenzyme Q10 (COQ-10 PO) Take 1 tablet by mouth daily.      Cyanocobalamin (B-12 SL) Place 1 tablet under the tongue daily.      diphenoxylate-atropine (LOMOTIL) 2.5-0.025 MG tablet Take 1 tablet by mouth 2 (two) times daily as needed (ulcerative colitis flare ups).      Ferrous Sulfate (IRON PO) Take 1 tablet by mouth daily.      finasteride (PROSCAR) 5 MG tablet Take 5 mg by mouth daily.      fluticasone (FLONASE) 50 MCG/ACT nasal spray Place 1 spray into both nostrils daily as needed for allergies or rhinitis.      fluticasone-salmeterol (ADVAIR) 250-50 MCG/ACT AEPB Inhale 1 puff into the lungs in the morning and at bedtime.      FOLIC ACID PO Take 1 tablet by mouth daily.      guaiFENesin (MUCINEX) 600 MG 12 hr tablet Take 600 mg by mouth daily.      loratadine (CLARITIN) 10 MG tablet Take 10 mg by mouth daily.      montelukast (SINGULAIR) 10 MG tablet TAKE 1 TABLET BY MOUTH  EVERY EVENING (Patient taking differently: Take 10 mg by mouth at bedtime.) 90 tablet 0    naphazoline-pheniramine (VISINE-A) 0.025-0.3 % ophthalmic solution Place 1 drop into both eyes 4 (four) times daily as needed for eye irritation (itchy eye).      silodosin (RAPAFLO) 8 MG CAPS capsule Take 8 mg by mouth daily.      simvastatin (ZOCOR) 20 MG tablet Take 20 mg by mouth every evening.       vedolizumab (ENTYVIO) 300 MG injection Inject 300 mg into the vein See admin instructions. Every 60 days      VITAMIN D PO Take 1 capsule by mouth daily.      cetirizine (ZYRTEC) 10 MG tablet Take 10 mg by mouth daily.      pantoprazole (PROTONIX) 40 MG tablet Take 40 mg by  mouth daily.      Allergies  Allergen Reactions   Lipitor [Atorvastatin Calcium] Other (See Comments)    Muscle ache   Penicillins Other (See Comments)    Patient has never had reaction, but Dr Glynn Octave told him not to take due to possible cross-sensitivity with mold allergy Has patient had a PCN reaction causing immediate rash, facial/tongue/throat swelling, SOB or lightheadedness with hypotension: No Has patient had a PCN reaction causing severe rash involving mucus membranes or skin necrosis: No Has patient had a PCN reaction that required hospitalization: No Has patient had a PCN reaction occurring within the last 10 years: No If all of the above    Ciprofloxacin Diarrhea   Molds & Smuts Rash   Moxifloxacin Rash    Social History  Tobacco Use   Smoking status: Former    Packs/day: 2.00    Years: 15.00    Total pack years: 30.00    Types: Cigarettes    Quit date: 08/08/1971    Years since quitting: 51.0   Smokeless tobacco: Former    Types: Chew    Quit date: 08/08/1971  Substance Use Topics   Alcohol use: No    Family History  Problem Relation Age of Onset   Heart disease Mother        MI   Hyperlipidemia Mother    Other Mother        varicose veins   Heart attack Mother    COPD Sister    Hyperlipidemia Father    Hypertension Father      Review of Systems  Constitutional:  Positive for diaphoresis and fatigue.  HENT:  Positive for hearing loss, sinus pressure and tinnitus.   Eyes: Negative.   Respiratory:  Positive for shortness of breath.   Cardiovascular:  Positive for chest pain and leg swelling.  Gastrointestinal:  Positive for constipation and diarrhea.  Endocrine: Negative.   Genitourinary:  Positive for difficulty urinating, frequency and urgency.  Musculoskeletal:  Positive for arthralgias.  Skin: Negative.   Allergic/Immunologic: Negative.   Neurological:  Positive for tremors.  Hematological:  Bruises/bleeds easily.  Psychiatric/Behavioral:  Negative.      Objective:  Physical Exam Constitutional:      Appearance: Normal appearance. He is normal weight.  HENT:     Head: Normocephalic and atraumatic.     Nose: Nose normal.  Eyes:     Pupils: Pupils are equal, round, and reactive to light.  Cardiovascular:     Pulses: Normal pulses.  Pulmonary:     Effort: Pulmonary effort is normal.  Musculoskeletal:        General: Tenderness present.     Cervical back: Normal range of motion and neck supple.     Comments: Left knee has obvious varus deformity, patient walks with a left-sided limp.  One plus effusion collateral ligaments are stable with a good endpoint quadriceps and hamstring muscles are adequate.  Regarding the edema in his left foot I rated at 1+ very little edema in the pretibial region.  Toes are pink and well perfused.  Skin:    General: Skin is warm and dry.  Neurological:     General: No focal deficit present.     Mental Status: He is alert and oriented to person, place, and time. Mental status is at baseline.  Psychiatric:        Mood and Affect: Mood normal.        Behavior: Behavior normal.        Thought Content: Thought content normal.        Judgment: Judgment normal.     Vital signs in last 24 hours:    Labs:   Estimated body mass index is 23.96 kg/m as calculated from the following:   Height as of 08/22/22: 5' 10"  (1.778 m).   Weight as of 08/22/22: 75.8 kg.   Imaging Review Plain radiographs demonstrate AP Rosenberg lateral and sunrise x-rays show a degree varus deformity of the left knee bone-on-bone arthritic changes.  4 mm erosion of the medial tibial plateau.  Peripheral osteophytes.  Lateral subluxation of tibia beneath femur.   Assessment/Plan:  End stage arthritis, left knee   The patient history, physical examination, clinical judgment of the provider and imaging studies are consistent with end stage degenerative  joint disease of the left knee(s) and total knee arthroplasty is  deemed medically necessary. The treatment options including medical management, injection therapy arthroscopy and arthroplasty were discussed at length. The risks and benefits of total knee arthroplasty were presented and reviewed. The risks due to aseptic loosening, infection, stiffness, patella tracking problems, thromboembolic complications and other imponderables were discussed. The patient acknowledged the explanation, agreed to proceed with the plan and consent was signed. Patient is being admitted for inpatient treatment for surgery, pain control, PT, OT, prophylactic antibiotics, VTE prophylaxis, progressive ambulation and ADL's and discharge planning. The patient is planning to be discharged home with home health services     Patient's anticipated LOS is less than 2 midnights, meeting these requirements: - Younger than 85 - Lives within 1 hour of care - Has a competent adult at home to recover with post-op recover - NO history of  - Chronic pain requiring opiods  - Diabetes  - Coronary Artery Disease  - Heart failure  - Heart attack  - Stroke  - DVT/VTE  - Cardiac arrhythmia  - Respiratory Failure/COPD  - Renal failure  - Anemia  - Advanced Liver disease

## 2022-09-03 MED ORDER — TRANEXAMIC ACID 1000 MG/10ML IV SOLN
2000.0000 mg | INTRAVENOUS | Status: DC
  Filled 2022-09-03: qty 20

## 2022-09-04 ENCOUNTER — Other Ambulatory Visit: Payer: Self-pay

## 2022-09-04 ENCOUNTER — Encounter (HOSPITAL_COMMUNITY)
Admission: RE | Disposition: A | Discharge status: Home / Self Care | Payer: Self-pay | Source: Home / Self Care | Attending: Orthopedic Surgery

## 2022-09-04 ENCOUNTER — Ambulatory Visit (HOSPITAL_COMMUNITY)
Admission: RE | Admit: 2022-09-04 | Discharge: 2022-09-04 | Disposition: A | Discharge status: Home / Self Care | Payer: Medicare Other | Attending: Orthopedic Surgery | Admitting: Orthopedic Surgery

## 2022-09-04 ENCOUNTER — Encounter (HOSPITAL_COMMUNITY): Payer: Self-pay | Admitting: Orthopedic Surgery

## 2022-09-04 ENCOUNTER — Ambulatory Visit (HOSPITAL_BASED_OUTPATIENT_CLINIC_OR_DEPARTMENT_OTHER): Payer: Medicare Other | Admitting: Certified Registered Nurse Anesthetist

## 2022-09-04 ENCOUNTER — Ambulatory Visit (HOSPITAL_COMMUNITY): Payer: Medicare Other | Admitting: Physician Assistant

## 2022-09-04 DIAGNOSIS — R0602 Shortness of breath: Secondary | ICD-10-CM | POA: Insufficient documentation

## 2022-09-04 DIAGNOSIS — H9319 Tinnitus, unspecified ear: Secondary | ICD-10-CM | POA: Diagnosis not present

## 2022-09-04 DIAGNOSIS — R5383 Other fatigue: Secondary | ICD-10-CM | POA: Insufficient documentation

## 2022-09-04 DIAGNOSIS — E785 Hyperlipidemia, unspecified: Secondary | ICD-10-CM | POA: Diagnosis not present

## 2022-09-04 DIAGNOSIS — N400 Enlarged prostate without lower urinary tract symptoms: Secondary | ICD-10-CM | POA: Insufficient documentation

## 2022-09-04 DIAGNOSIS — K5900 Constipation, unspecified: Secondary | ICD-10-CM | POA: Diagnosis not present

## 2022-09-04 DIAGNOSIS — F32A Depression, unspecified: Secondary | ICD-10-CM | POA: Diagnosis not present

## 2022-09-04 DIAGNOSIS — R251 Tremor, unspecified: Secondary | ICD-10-CM | POA: Diagnosis not present

## 2022-09-04 DIAGNOSIS — M1712 Unilateral primary osteoarthritis, left knee: Secondary | ICD-10-CM

## 2022-09-04 DIAGNOSIS — R61 Generalized hyperhidrosis: Secondary | ICD-10-CM | POA: Insufficient documentation

## 2022-09-04 DIAGNOSIS — R079 Chest pain, unspecified: Secondary | ICD-10-CM | POA: Insufficient documentation

## 2022-09-04 DIAGNOSIS — S83142A Lateral subluxation of proximal end of tibia, left knee, initial encounter: Secondary | ICD-10-CM

## 2022-09-04 DIAGNOSIS — R197 Diarrhea, unspecified: Secondary | ICD-10-CM | POA: Diagnosis not present

## 2022-09-04 DIAGNOSIS — J449 Chronic obstructive pulmonary disease, unspecified: Secondary | ICD-10-CM | POA: Insufficient documentation

## 2022-09-04 DIAGNOSIS — K449 Diaphragmatic hernia without obstruction or gangrene: Secondary | ICD-10-CM | POA: Insufficient documentation

## 2022-09-04 DIAGNOSIS — I739 Peripheral vascular disease, unspecified: Secondary | ICD-10-CM | POA: Insufficient documentation

## 2022-09-04 DIAGNOSIS — M21162 Varus deformity, not elsewhere classified, left knee: Secondary | ICD-10-CM

## 2022-09-04 DIAGNOSIS — K5190 Ulcerative colitis, unspecified, without complications: Secondary | ICD-10-CM | POA: Diagnosis not present

## 2022-09-04 DIAGNOSIS — K219 Gastro-esophageal reflux disease without esophagitis: Secondary | ICD-10-CM | POA: Insufficient documentation

## 2022-09-04 DIAGNOSIS — H9190 Unspecified hearing loss, unspecified ear: Secondary | ICD-10-CM | POA: Insufficient documentation

## 2022-09-04 HISTORY — PX: TOTAL KNEE ARTHROPLASTY: SHX125

## 2022-09-04 SURGERY — ARTHROPLASTY, KNEE, TOTAL
Anesthesia: Spinal | Site: Knee | Laterality: Left

## 2022-09-04 MED ORDER — BUPIVACAINE-EPINEPHRINE (PF) 0.25% -1:200000 IJ SOLN
INTRAMUSCULAR | Status: DC | PRN
  Administered 2022-09-04: 30 mL

## 2022-09-04 MED ORDER — ACETAMINOPHEN 325 MG PO TABS: 325.0000 mg | ORAL_TABLET | Freq: Four times a day (QID) | ORAL | Status: DC | PRN

## 2022-09-04 MED ORDER — 0.9 % SODIUM CHLORIDE (POUR BTL) OPTIME
TOPICAL | Status: DC | PRN
  Administered 2022-09-04: 1000 mL

## 2022-09-04 MED ORDER — SODIUM CHLORIDE (PF) 0.9 % IJ SOLN
INTRAMUSCULAR | Status: AC
  Filled 2022-09-04: qty 50

## 2022-09-04 MED ORDER — TRANEXAMIC ACID-NACL 1000-0.7 MG/100ML-% IV SOLN
1000.0000 mg | INTRAVENOUS | Status: AC
  Administered 2022-09-04: 1000 mg via INTRAVENOUS
  Filled 2022-09-04: qty 100

## 2022-09-04 MED ORDER — LACTATED RINGERS IV BOLUS
250.0000 mL | Freq: Once | INTRAVENOUS | Status: AC
  Administered 2022-09-04: 250 mL via INTRAVENOUS

## 2022-09-04 MED ORDER — BUPIVACAINE LIPOSOME 1.3 % IJ SUSP
INTRAMUSCULAR | Status: AC
  Filled 2022-09-04: qty 20

## 2022-09-04 MED ORDER — SODIUM CHLORIDE 0.9 % IR SOLN
Status: DC | PRN
  Administered 2022-09-04: 1000 mL

## 2022-09-04 MED ORDER — PHENYLEPHRINE HCL-NACL 20-0.9 MG/250ML-% IV SOLN
INTRAVENOUS | Status: DC | PRN
  Administered 2022-09-04: 35 ug/min via INTRAVENOUS

## 2022-09-04 MED ORDER — TRANEXAMIC ACID-NACL 1000-0.7 MG/100ML-% IV SOLN: 1000.0000 mg | Freq: Once | INTRAVENOUS | Status: DC

## 2022-09-04 MED ORDER — LACTATED RINGERS IV SOLN: INTRAVENOUS | Status: DC

## 2022-09-04 MED ORDER — METHOCARBAMOL 500 MG IVPB - SIMPLE MED: 500.0000 mg | Freq: Four times a day (QID) | INTRAVENOUS | Status: DC | PRN

## 2022-09-04 MED ORDER — WATER FOR IRRIGATION, STERILE IR SOLN
Status: DC | PRN
  Administered 2022-09-04: 2000 mL

## 2022-09-04 MED ORDER — BUPIVACAINE IN DEXTROSE 0.75-8.25 % IT SOLN
INTRATHECAL | Status: DC | PRN
  Administered 2022-09-04: 12 mg via INTRATHECAL

## 2022-09-04 MED ORDER — ACETAMINOPHEN 500 MG PO TABS
1000.0000 mg | ORAL_TABLET | Freq: Four times a day (QID) | ORAL | Status: DC
  Administered 2022-09-04: 1000 mg via ORAL

## 2022-09-04 MED ORDER — ASPIRIN 81 MG PO TBEC: 81.0000 mg | DELAYED_RELEASE_TABLET | Freq: Two times a day (BID) | ORAL | 0 refills | Status: AC

## 2022-09-04 MED ORDER — FENTANYL CITRATE (PF) 100 MCG/2ML IJ SOLN
INTRAMUSCULAR | Status: DC | PRN
  Administered 2022-09-04: 50 ug via INTRAVENOUS
  Administered 2022-09-04: 25 ug via INTRAVENOUS

## 2022-09-04 MED ORDER — PROPOFOL 1000 MG/100ML IV EMUL
INTRAVENOUS | Status: AC
  Filled 2022-09-04: qty 100

## 2022-09-04 MED ORDER — ONDANSETRON HCL 4 MG/2ML IJ SOLN
INTRAMUSCULAR | Status: DC | PRN
  Administered 2022-09-04: 4 mg via INTRAVENOUS

## 2022-09-04 MED ORDER — METHOCARBAMOL 500 MG IVPB - SIMPLE MED
INTRAVENOUS | Status: AC
  Administered 2022-09-04: 500 mg via INTRAVENOUS
  Filled 2022-09-04: qty 55

## 2022-09-04 MED ORDER — OXYCODONE-ACETAMINOPHEN 5-325 MG PO TABS: 1.0000 | ORAL_TABLET | ORAL | 0 refills | Status: DC | PRN

## 2022-09-04 MED ORDER — BUPIVACAINE LIPOSOME 1.3 % IJ SUSP
INTRAMUSCULAR | Status: DC | PRN
  Administered 2022-09-04: 10 mL via PERINEURAL

## 2022-09-04 MED ORDER — BUPIVACAINE HCL (PF) 0.25 % IJ SOLN
INTRAMUSCULAR | Status: AC
  Filled 2022-09-04: qty 30

## 2022-09-04 MED ORDER — FENTANYL CITRATE (PF) 100 MCG/2ML IJ SOLN
INTRAMUSCULAR | Status: AC
  Filled 2022-09-04: qty 2

## 2022-09-04 MED ORDER — OXYCODONE HCL 5 MG PO TABS: 5.0000 mg | ORAL_TABLET | ORAL | Status: DC | PRN

## 2022-09-04 MED ORDER — ORAL CARE MOUTH RINSE: 15.0000 mL | Freq: Once | OROMUCOSAL | Status: AC

## 2022-09-04 MED ORDER — EPINEPHRINE PF 1 MG/ML IJ SOLN
INTRAMUSCULAR | Status: AC
  Filled 2022-09-04: qty 1

## 2022-09-04 MED ORDER — POVIDONE-IODINE 10 % EX SWAB
2.0000 | Freq: Once | CUTANEOUS | Status: AC
  Administered 2022-09-04: 2 via TOPICAL

## 2022-09-04 MED ORDER — TIZANIDINE HCL 2 MG PO TABS: 2.0000 mg | ORAL_TABLET | Freq: Four times a day (QID) | ORAL | 0 refills | Status: AC | PRN

## 2022-09-04 MED ORDER — HYDROMORPHONE HCL 1 MG/ML IJ SOLN: 0.2500 mg | INTRAMUSCULAR | Status: DC | PRN

## 2022-09-04 MED ORDER — PROPOFOL 500 MG/50ML IV EMUL
INTRAVENOUS | Status: DC | PRN
  Administered 2022-09-04: 75 ug/kg/min via INTRAVENOUS

## 2022-09-04 MED ORDER — BUPIVACAINE LIPOSOME 1.3 % IJ SUSP: 20.0000 mL | Freq: Once | INTRAMUSCULAR | Status: DC

## 2022-09-04 MED ORDER — ACETAMINOPHEN 500 MG PO TABS
ORAL_TABLET | ORAL | Status: AC
  Filled 2022-09-04: qty 2

## 2022-09-04 MED ORDER — VANCOMYCIN HCL IN DEXTROSE 1-5 GM/200ML-% IV SOLN
1000.0000 mg | INTRAVENOUS | Status: AC
  Administered 2022-09-04: 1000 mg via INTRAVENOUS
  Filled 2022-09-04: qty 200

## 2022-09-04 MED ORDER — BUPIVACAINE LIPOSOME 1.3 % IJ SUSP
INTRAMUSCULAR | Status: DC | PRN
  Administered 2022-09-04: 20 mL

## 2022-09-04 MED ORDER — ONDANSETRON HCL 4 MG/2ML IJ SOLN: 4.0000 mg | Freq: Once | INTRAMUSCULAR | Status: DC | PRN

## 2022-09-04 MED ORDER — BUPIVACAINE HCL (PF) 0.5 % IJ SOLN
INTRAMUSCULAR | Status: DC | PRN
  Administered 2022-09-04: 15 mL via PERINEURAL

## 2022-09-04 MED ORDER — TRANEXAMIC ACID 1000 MG/10ML IV SOLN
INTRAVENOUS | Status: DC | PRN
  Administered 2022-09-04: 2000 mg via TOPICAL

## 2022-09-04 MED ORDER — ONDANSETRON HCL 4 MG/2ML IJ SOLN
INTRAMUSCULAR | Status: AC
  Filled 2022-09-04: qty 2

## 2022-09-04 MED ORDER — SODIUM CHLORIDE (PF) 0.9 % IJ SOLN
INTRAMUSCULAR | Status: AC
  Filled 2022-09-04: qty 20

## 2022-09-04 MED ORDER — SODIUM CHLORIDE (PF) 0.9 % IJ SOLN
INTRAMUSCULAR | Status: DC | PRN
  Administered 2022-09-04: 70 mL

## 2022-09-04 MED ORDER — EPHEDRINE SULFATE-NACL 50-0.9 MG/10ML-% IV SOSY
PREFILLED_SYRINGE | INTRAVENOUS | Status: DC | PRN
  Administered 2022-09-04 (×2): 5 mg via INTRAVENOUS

## 2022-09-04 MED ORDER — METHOCARBAMOL 500 MG PO TABS: 500.0000 mg | ORAL_TABLET | Freq: Four times a day (QID) | ORAL | Status: DC | PRN

## 2022-09-04 MED ORDER — PROPOFOL 10 MG/ML IV BOLUS
INTRAVENOUS | Status: DC | PRN
  Administered 2022-09-04 (×2): 10 mg via INTRAVENOUS

## 2022-09-04 MED ORDER — CHLORHEXIDINE GLUCONATE 0.12 % MT SOLN
15.0000 mL | Freq: Once | OROMUCOSAL | Status: AC
  Administered 2022-09-04: 15 mL via OROMUCOSAL

## 2022-09-04 MED ORDER — OXYCODONE HCL 5 MG PO TABS: 5.0000 mg | ORAL_TABLET | Freq: Once | ORAL | Status: DC | PRN

## 2022-09-04 MED ORDER — LACTATED RINGERS IV BOLUS
500.0000 mL | Freq: Once | INTRAVENOUS | Status: AC
  Administered 2022-09-04: 500 mL via INTRAVENOUS

## 2022-09-04 MED ORDER — OXYCODONE HCL 5 MG/5ML PO SOLN: 5.0000 mg | Freq: Once | ORAL | Status: DC | PRN

## 2022-09-04 MED ORDER — OXYCODONE HCL 5 MG PO TABS: 10.0000 mg | ORAL_TABLET | ORAL | Status: DC | PRN

## 2022-09-04 SURGICAL SUPPLY — 51 items
ATTUNE MED DOME PAT 41 KNEE (Knees) IMPLANT
ATTUNE PS FEM LT SZ 8 CEM KNEE (Femur) IMPLANT
ATTUNE PSRP INSR SZ8 5 KNEE (Insert) IMPLANT
BAG COUNTER SPONGE SURGICOUNT (BAG) IMPLANT
BAG DECANTER FOR FLEXI CONT (MISCELLANEOUS) ×2 IMPLANT
BAG SPEC THK2 15X12 ZIP CLS (MISCELLANEOUS) ×1
BAG SPNG CNTER NS LX DISP (BAG)
BAG ZIPLOCK 12X15 (MISCELLANEOUS) ×2 IMPLANT
BASE TIBIAL ATTUNE KNEE SZ9 (Knees) IMPLANT
BLADE SAG 18X100X1.27 (BLADE) ×2 IMPLANT
BLADE SAW SGTL 11.0X1.19X90.0M (BLADE) ×2 IMPLANT
BLADE SURG SZ10 CARB STEEL (BLADE) ×4 IMPLANT
BNDG CMPR MED 10X6 ELC LF (GAUZE/BANDAGES/DRESSINGS) ×1
BNDG ELASTIC 6X10 VLCR STRL LF (GAUZE/BANDAGES/DRESSINGS) ×2 IMPLANT
BOWL SMART MIX CTS (DISPOSABLE) ×2 IMPLANT
BSPLAT TIB 9 CMNT ROT PLAT STR (Knees) ×1 IMPLANT
CEMENT HV SMART SET (Cement) ×4 IMPLANT
COVER SURGICAL LIGHT HANDLE (MISCELLANEOUS) ×2 IMPLANT
DRAPE INCISE IOBAN 66X45 STRL (DRAPES) IMPLANT
DRAPE U-SHAPE 47X51 STRL (DRAPES) ×2 IMPLANT
DRSG AQUACEL AG ADV 3.5X10 (GAUZE/BANDAGES/DRESSINGS) ×2 IMPLANT
DURAPREP 26ML APPLICATOR (WOUND CARE) ×2 IMPLANT
ELECT REM PT RETURN 15FT ADLT (MISCELLANEOUS) ×2 IMPLANT
GLOVE BIO SURGEON STRL SZ7.5 (GLOVE) ×2 IMPLANT
GLOVE BIO SURGEON STRL SZ8.5 (GLOVE) ×2 IMPLANT
GLOVE BIOGEL PI IND STRL 8 (GLOVE) ×2 IMPLANT
GLOVE BIOGEL PI IND STRL 9 (GLOVE) ×2 IMPLANT
GOWN STRL REUS W/ TWL XL LVL3 (GOWN DISPOSABLE) ×4 IMPLANT
GOWN STRL REUS W/TWL XL LVL3 (GOWN DISPOSABLE) ×2
HANDPIECE INTERPULSE COAX TIP (DISPOSABLE) ×1
HOOD PEEL AWAY T7 (MISCELLANEOUS) ×6 IMPLANT
KIT TURNOVER KIT A (KITS) IMPLANT
NDL HYPO 21X1.5 SAFETY (NEEDLE) ×4 IMPLANT
NEEDLE HYPO 21X1.5 SAFETY (NEEDLE) ×2 IMPLANT
NS IRRIG 1000ML POUR BTL (IV SOLUTION) ×2 IMPLANT
PACK TOTAL KNEE CUSTOM (KITS) ×2 IMPLANT
PIN STEINMAN FIXATION KNEE (PIN) IMPLANT
PROTECTOR NERVE ULNAR (MISCELLANEOUS) ×2 IMPLANT
SET HNDPC FAN SPRY TIP SCT (DISPOSABLE) ×2 IMPLANT
SPIKE FLUID TRANSFER (MISCELLANEOUS) ×6 IMPLANT
SUT VIC AB 1 CTX 36 (SUTURE) ×1
SUT VIC AB 1 CTX36XBRD ANBCTR (SUTURE) ×2 IMPLANT
SUT VIC AB 3-0 CT1 27 (SUTURE) ×3
SUT VIC AB 3-0 CT1 TAPERPNT 27 (SUTURE) ×6 IMPLANT
SYR CONTROL 10ML LL (SYRINGE) ×4 IMPLANT
TIBIAL BASE ATTUNE KNEE SZ9 (Knees) ×1 IMPLANT
TRAY CATH INTERMITTENT SS 16FR (CATHETERS) IMPLANT
TRAY FOLEY MTR SLVR 16FR STAT (SET/KITS/TRAYS/PACK) ×2 IMPLANT
TUBE SUCTION HIGH CAP CLEAR NV (SUCTIONS) ×2 IMPLANT
WATER STERILE IRR 1000ML POUR (IV SOLUTION) ×4 IMPLANT
WRAP KNEE MAXI GEL POST OP (GAUZE/BANDAGES/DRESSINGS) ×2 IMPLANT

## 2022-09-04 NOTE — Consult Note (Signed)
Urology Consult    History of Present Illness: [name] is a 82 y.o. undergoing a left total knee replacement today. I was consulted to aid in foley placement  Foley placement attempted at conclusion of case by OR staff - an in and out catheter met resistance and was unsuccessful. A 14 fr coude catheter went easily, but no urine returned, and it was removed  [name] is known to our clinic due a history of BPH; he takes tamsulosin and finasteride for this. No history of GU surgeries.   Patient seen in [place] - extubated but sedated and not able to provide history  Past Medical History:  Diagnosis Date   Arthritis    "joints" (05/09/2018)   Asthma    Chronic bronchitis    COPD (chronic obstructive pulmonary disease) (HCC)    Depression    GERD (gastroesophageal reflux disease)    Hearing loss in left ear    High cholesterol    History of hiatal hernia    Lymphedema of left leg    "foot" (05/09/2018)   Mycobacterium avium complex ([place])    Peripheral edema    Pneumonia 04/2018   pt. denies   Sinusitis    Ulcerative colitis     Past Surgical History:  Procedure Laterality Date   BIOPSY  03/18/2019   Procedure: BIOPSY;  Surgeon: Ronald Lobo, MD;  [facility];  Service: Endoscopy;;  Egd and Flex   CATARACT EXTRACTION W/ INTRAOCULAR LENS  IMPLANT, BILATERAL Bilateral    COLONOSCOPY W/ BIOPSIES AND POLYPECTOMY     several with polys removed   ESOPHAGOGASTRODUODENOSCOPY (EGD) WITH PROPOFOL N/A 03/18/2019   Procedure: ESOPHAGOGASTRODUODENOSCOPY (EGD) WITH PROPOFOL;  Surgeon: Ronald Lobo, MD;  [facility];  Service: Endoscopy;  Laterality: N/A;  Will need enteroscope    FLEXIBLE SIGMOIDOSCOPY N/A 03/18/2019   Procedure: FLEXIBLE SIGMOIDOSCOPY;  Surgeon: Ronald Lobo, MD;  [facility];  Service: Endoscopy;  Laterality: N/A;  Unprepped   HERNIA REPAIR  2013   x 3 hernias repaired   JOINT REPLACEMENT     SHOULDER OPEN ROTATOR CUFF REPAIR  Bilateral    TOTAL KNEE ARTHROPLASTY Right 08/13/2015   Procedure: RIGHT TOTAL KNEE ARTHROPLASTY;  Surgeon: Gaynelle Arabian, MD;  Location: [facility];  Service: Orthopedics;  Laterality: Right;    Current Hospital Medications:  Home Meds:  No current facility-administered medications on file prior to encounter.   Current Outpatient Medications on File Prior to Encounter  Medication Sig Dispense Refill   acetaminophen (TYLENOL) 500 MG tablet Take 500 mg by mouth every 8 (eight) hours as needed for moderate pain.     albuterol (PROVENTIL HFA;VENTOLIN HFA) 108 (90 Base) MCG/ACT inhaler Inhale 2 puffs into the lungs every 4 (four) hours as needed for wheezing or shortness of breath. 3 Inhaler 3   aspirin EC 81 MG tablet Take 81 mg by mouth daily.     Calcium Citrate-Vitamin D (CALCIUM + D PO) Take 1 tablet by mouth daily.     cetirizine (ZYRTEC) 10 MG tablet Take 10 mg by mouth daily.     Coenzyme Q10 (COQ-10 PO) Take 1 tablet by mouth daily.     Cyanocobalamin (B-12 SL) Place 1 tablet under the tongue daily.     diphenoxylate-atropine (LOMOTIL) 2.5-0.025 MG tablet Take 1 tablet by mouth 2 (two) times daily as needed (ulcerative colitis flare ups).     Ferrous Sulfate (IRON PO) Take 1 tablet by mouth daily.     finasteride (  PROSCAR) 5 MG tablet Take 5 mg by mouth daily.     fluticasone (FLONASE) 50 MCG/ACT nasal spray Place 1 spray into both nostrils daily as needed for allergies or rhinitis.     fluticasone-salmeterol (ADVAIR) 250-50 MCG/ACT AEPB Inhale 1 puff into the lungs in the morning and at bedtime.     FOLIC ACID PO Take 1 tablet by mouth daily.     guaiFENesin (MUCINEX) 600 MG 12 hr tablet Take 600 mg by mouth daily.     loratadine (CLARITIN) 10 MG tablet Take 10 mg by mouth daily.     montelukast (SINGULAIR) 10 MG tablet TAKE 1 TABLET BY MOUTH  EVERY EVENING (Patient taking differently: Take 10 mg by mouth at bedtime.) 90 tablet 0   naphazoline-pheniramine (VISINE-A) 0.025-0.3 % ophthalmic  solution Place 1 drop into both eyes 4 (four) times daily as needed for eye irritation (itchy eye).     silodosin (RAPAFLO) 8 MG CAPS capsule Take 8 mg by mouth daily.     simvastatin (ZOCOR) 20 MG tablet Take 20 mg by mouth every evening.      vedolizumab (ENTYVIO) 300 MG injection Inject 300 mg into the vein See admin instructions. Every 60 days     VITAMIN D PO Take 1 capsule by mouth daily.       Scheduled Meds:  acetaminophen       acetaminophen  1,000 mg Oral Q6H   bupivacaine liposome  20 mL Other Once   tranexamic acid (CYKLOKAPRON) 2,000 mg in sodium chloride 0.9 % 50 mL Topical Application  9,735 mg Topical To OR   Continuous Infusions:  lactated ringers     lactated ringers 10 mL/hr at 09/04/22 0725   methocarbamol (ROBAXIN) IV 500 mg (09/04/22 1029)   tranexamic acid     PRN Meds:.0.9 % irrigation (POUR BTL), acetaminophen, [START ON 09/05/2022] acetaminophen, bupivacaine liposome, bupivacaine-epinephrine (PF), HYDROmorphone (DILAUDID) injection, methocarbamol **OR** methocarbamol (ROBAXIN) IV, ondansetron (ZOFRAN) IV, oxyCODONE, oxyCODONE **OR** oxyCODONE, oxyCODONE, sodium chloride (PF), sodium chloride irrigation, tranexamic acid (CYKLOKAPRON) 2,000 mg in sodium chloride 0.9 % 50 mL Topical Application, water for irrigation, sterile  Allergies:  Allergies  Allergen Reactions   Lipitor [Atorvastatin Calcium] Other (See Comments)    Muscle ache   Penicillins Other (See Comments)    Patient has never had reaction, but Dr Glynn Octave told him not to take due to possible cross-sensitivity with mold allergy Has patient had a PCN reaction causing immediate rash, facial/tongue/throat swelling, SOB or lightheadedness with hypotension: No Has patient had a PCN reaction causing severe rash involving mucus membranes or skin necrosis: No Has patient had a PCN reaction that required hospitalization: No Has patient had a PCN reaction occurring within the last 10 years: No If all of the  above    Ciprofloxacin Diarrhea   Molds & Smuts Rash   Moxifloxacin Rash    Family History  Problem Relation Age of Onset   Heart disease Mother        MI   Hyperlipidemia Mother    Other Mother        varicose veins   Heart attack Mother    COPD Sister    Hyperlipidemia Father    Hypertension Father     Social History:  reports that he quit smoking about 51 years ago. His smoking use included cigarettes. He has a 30.00 pack-year smoking history. He quit smokeless tobacco use about 51 years ago.  His smokeless tobacco use included chew. He reports  that he does not drink alcohol and does not use drugs.  ROS: A complete review of systems was performed.  All systems are negative except for pertinent findings as noted.  Physical Exam:  Vital signs in last 24 hours: Temp:  [97.8 F (36.6 C)-98.1 F (36.7 C)] 97.8 F (36.6 C) (01/29 0954) Pulse Rate:  [70-90] 70 (01/29 1000) Resp:  [16-18] 18 (01/29 1000) BP: (107-125)/(72-85) 108/72 (01/29 1000) SpO2:  [96 %-99 %] 99 % (01/29 1000) Constitutional:  Alert and oriented, No acute distress Cardiovascular: Regular rate and rhythm Respiratory: Normal respiratory effort, Lungs clear bilaterally GI: Abdomen is soft, nontender, nondistended, no abdominal masses GU: No CVA tenderness Neurologic: Grossly intact, no focal deficits Psychiatric: Normal mood and affect  Laboratory Data:  No results for input(s): "WBC", "HGB", "HCT", "PLT" in the last 72 hours.  No results for input(s): "NA", "K", "CL", "GLUCOSE", "BUN", "CALCIUM", "CREATININE" in the last 72 hours.  Invalid input(s): "CO3"   No results found for this or any previous visit (from the past 24 hour(s)). No results found for this or any previous visit (from the past 240 hour(s)).  Renal Function: No results for input(s): "CREATININE" in the last 168 hours. Estimated Creatinine Clearance: 65 mL/min (by C-G formula based on SCr of 0.92 mg/dL).  Radiologic Imaging: No  results found.  Procedure Patient was prepped and draped in the usual sterile fashion. I was able to easily insert a 16 fr coude catheter. There was initially no urine output. I irrigated with a cath tip syringe, and there was copious return of clear yellow urine. The balloon was inflated with 10 cc and the foley was connected to the appropriate collection bag    Impression/Recommendation --no evidence of catheter trauma or lower urinary tract pathology otherwise. Catheter can be dc'd in PACU. If unable to void, ok for nursing staff to replace and discharge home with plans for voiding trial (would use coude as less traumatic).   Donald Pore MD 09/04/2022, 10:52 AM  [facility]  Pager: [phone]

## 2022-09-04 NOTE — Anesthesia Procedure Notes (Signed)
Spinal  Patient location during procedure: OR Start time: 09/04/2022 7:27 AM End time: 09/04/2022 7:31 AM Reason for block: surgical anesthesia Staffing Performed: anesthesiologist  Anesthesiologist: Barnet Glasgow, MD Performed by: Barnet Glasgow, MD Authorized by: Josephine Igo, MD   Preanesthetic Checklist Completed: patient identified, IV checked, risks and benefits discussed, surgical consent, monitors and equipment checked, pre-op evaluation and timeout performed Spinal Block Patient position: sitting Prep: DuraPrep and site prepped and draped Patient monitoring: heart rate, cardiac monitor, continuous pulse ox and blood pressure Approach: midline Location: L3-4 Injection technique: single-shot Needle Needle type: Pencan  Needle gauge: 24 G Needle length: 10 cm Needle insertion depth: 5 cm Assessment Sensory level: T4 Events: CSF return Additional Notes  1 Attempt (s). Pt tolerated procedure well.

## 2022-09-04 NOTE — Evaluation (Signed)
Physical Therapy Evaluation Patient Details Name: [name] MRN: [id number] DOB: [birth year] Today's Date: 09/04/2022  History of Present Illness  Pt is an 82yo male presenting s/p L-TKA on 09/04/22. PMH: COPD, GERD, lymphedema LLE, R-TKA 2017, ulcerative colitis,   Clinical Impression  [name] is a 82 y.o. male POD 0 s/p L-TKA. Patient reports independence with mobility at baseline. Patient is now limited by functional impairments (see PT problem list below) and requires min guard for transfers and gait with RW. Patient was able to ambulate 120 feet with RW and min guard and cues for safe walker management. Patient educated on safe sequencing for stair mobility and verbalized safe guarding position for people assisting with mobility. Patient instructed in exercises to facilitate ROM and circulation. Patient will benefit from continued skilled PT interventions to address impairments and progress towards PLOF. Patient has met mobility goals at adequate level for discharge home; will continue to follow if pt continues acute stay to progress towards Mod I goals.       Recommendations for follow up therapy are one component of a multi-disciplinary discharge planning process, led by the attending physician.  Recommendations may be updated based on patient status, additional functional criteria and insurance authorization.  Follow Up Recommendations Follow physician's recommendations for discharge plan and follow up therapies      Assistance Recommended at Discharge Frequent or constant Supervision/Assistance  Patient can return home with the following  A little help with walking and/or transfers;A little help with bathing/dressing/bathroom;Assistance with cooking/housework;Help with stairs or ramp for entrance;Assist for transportation    Equipment Recommendations None recommended by PT  Recommendations for Other Services       Functional Status Assessment Patient has had a recent  decline in their functional status and demonstrates the ability to make significant improvements in function in a reasonable and predictable amount of time.     Precautions / Restrictions Precautions Precautions: Knee Precaution Booklet Issued: No Precaution Comments: no pillow under the knee Restrictions Weight Bearing Restrictions: No Other Position/Activity Restrictions: wbat      Mobility  Bed Mobility Overal bed mobility: Needs Assistance Bed Mobility: Supine to Sit     Supine to sit: Min guard     General bed mobility comments: For safety only    Transfers Overall transfer level: Needs assistance Equipment used: Rolling walker (2 wheels) Transfers: Sit to/from Stand Sit to Stand: Min guard, From elevated surface           General transfer comment: VCs for hand placement    Ambulation/Gait Ambulation/Gait assistance: Min guard Gait Distance (Feet): 120 Feet Assistive device: Rolling walker (2 wheels) Gait Pattern/deviations: Step-to pattern Gait velocity: decreased     General Gait Details: Pt ambulated with RW and min guard, no physical assist required or overt LOB noted.  Stairs Stairs: Yes Stairs assistance: Min guard Stair Management: One rail Left, Two rails, Step to pattern, Sideways, Forwards Number of Stairs: 4 General stair comments: Pt completed stair training via two methods: sideways with L railing and with bilateral rails, handouts provided. Pt required min guard for safety, VCs for sequencing, no physical assist required or overt LOB noted  Wheelchair Mobility    Modified Rankin (Stroke Patients Only)       Balance Overall balance assessment: Needs assistance Sitting-balance support: Feet supported, No upper extremity supported Sitting balance-Leahy Scale: Good     Standing balance support: Reliant on assistive device for balance, During functional activity, Bilateral upper extremity supported Standing  balance-Leahy Scale: Poor                                Pertinent Vitals/Pain Pain Assessment Pain Assessment: No/denies pain    Home Living Family/patient expects to be discharged to:: Private residence Living Arrangements: Alone Available Help at Discharge: Family;Available 24 hours/day Type of Home: House Home Access: Stairs to enter Entrance Stairs-Rails: Chemical engineer of Steps: 3   Home Layout: One level Home Equipment: Conservation officer, nature (2 wheels);Cane - single point      Prior Function Prior Level of Function : Independent/Modified Independent             Mobility Comments: IND ADLs Comments: IND     Hand Dominance        Extremity/Trunk Assessment   Upper Extremity Assessment Upper Extremity Assessment: Overall WFL for tasks assessed    Lower Extremity Assessment Lower Extremity Assessment: RLE deficits/detail;LLE deficits/detail RLE Deficits / Details: MMT ank DF/PF 5/5 RLE Sensation: WNL LLE Deficits / Details: MMT ank DF/PF 5/5, no extensor lag ntoed LLE Sensation: WNL    Cervical / Trunk Assessment Cervical / Trunk Assessment: Normal  Communication   Communication: No difficulties  Cognition Arousal/Alertness: Awake/alert Behavior During Therapy: WFL for tasks assessed/performed Overall Cognitive Status: Within Functional Limits for tasks assessed                                          General Comments General comments (skin integrity, edema, etc.): children Present    Exercises Total Joint Exercises Ankle Circles/Pumps: AROM, Both, 20 reps Quad Sets: AROM, Left, Other reps (comment) (2) Short Arc Quad: AROM, Left, Other reps (comment) (2) Heel Slides: AROM, Left, Other reps (comment) (2) Hip ABduction/ADduction: AROM, Left, Other reps (comment) (4) Straight Leg Raises: AROM, Left, Other reps (comment) (3) Goniometric ROM: -5-90deg by gross visual approximation   Assessment/Plan    PT Assessment Patient needs  continued PT services  PT Problem List Decreased strength;Decreased range of motion;Decreased activity tolerance;Decreased balance;Decreased mobility;Pain       PT Treatment Interventions      PT Goals (Current goals can be found in the Care Plan section)  Acute Rehab PT Goals Patient Stated Goal: To go home PT Goal Formulation: With patient Time For Goal Achievement: 09/11/22 Potential to Achieve Goals: Good    Frequency 7X/week     Co-evaluation               AM-PAC PT "6 Clicks" Mobility  Outcome Measure Help needed turning from your back to your side while in a flat bed without using bedrails?: None Help needed moving from lying on your back to sitting on the side of a flat bed without using bedrails?: A Little Help needed moving to and from a bed to a chair (including a wheelchair)?: A Little Help needed standing up from a chair using your arms (e.g., wheelchair or bedside chair)?: A Little Help needed to walk in hospital room?: A Little Help needed climbing 3-5 steps with a railing? : A Little 6 Click Score: 19    End of Session Equipment Utilized During Treatment: Gait belt Activity Tolerance: No increased pain;Patient tolerated treatment well Patient left: in chair;with call bell/phone within reach;with family/visitor present Nurse Communication: Mobility status PT Visit Diagnosis: Pain;Difficulty in walking, not elsewhere classified (R26.2) Pain -  Right/Left: Left Pain - part of body: Knee    Time: 1202-1236 PT Time Calculation (min) (ACUTE ONLY): 34 min   Charges:   PT Evaluation $PT Eval Low Complexity: 1 Low PT Treatments $Gait Training: 23-37 mins        Coolidge Breeze, PT, DPT [facility] Department Office: [phone] Weekend pager: [phone]  Coolidge Breeze 09/04/2022, 1:14 PM

## 2022-09-04 NOTE — Op Note (Signed)
PATIENT ID:      [name]  MRN:     [id number] DOB/AGE:    Dec 13, 1940 / 83 y.o.       OPERATIVE REPORT   DATE OF PROCEDURE:  09/04/2022      PREOPERATIVE DIAGNOSIS:   LEFT KNEE OSTEOARTHRITIS VARUS      Estimated body mass index is 23.96 kg/m as calculated from the following:   Height as of 08/22/22: 5\' 10"  (1.778 m).   Weight as of 08/22/22: 75.8 kg.                                                       POSTOPERATIVE DIAGNOSIS:   Same                                                                  PROCEDURE:  Procedure(s): LEFT TOTAL KNEE ARTHROPLASTY Using DepuyAttune RP implants #8L Femur, #9Tibia, 41mm mm Attune RP bearing, 41 Patella    SURGEON: Kerin Salen  ASSISTANT:   Kerry Hough. [employer]   (Present and scrubbed throughout the case, critical for assistance with exposure, retraction, instrumentation, and closure.)        ANESTHESIA: Spinal, 20cc Exparel, 50cc 0.25% Marcaine EBL: 300 cc FLUID REPLACEMENT: 1500 cc crystaloid TOURNIQUET: DRAINS: None TRANEXAMIC ACID: 1gm IV, 2gm topical COMPLICATIONS:  None         INDICATIONS FOR PROCEDURE: The patient has  LEFT KNEE OSTEOARTHRITIS VARUS, Var deformities, XR shows bone on bone arthritis, lateral subluxation of tibia. Patient has failed all conservative measures including anti-inflammatory medicines, narcotics, attempts at exercise and weight loss, cortisone injections and viscosupplementation.  Risks and benefits of surgery have been discussed, questions answered.   DESCRIPTION OF PROCEDURE: The patient identified by armband, received  IV antibiotics, in the holding area at [facility]. Patient taken to the operating room, appropriate anesthetic monitors were attached, and Spinal anesthesia was  induced. IV Tranexamic acid was given. Lateral post and 2 surefoot positioners applied to the table, the lower extremity was then prepped and draped in usual sterile fashion from the toes to the high thigh. Time-out  procedure was performed. Kerry Hough. [facility] PAC, was present and scrubbed throughout the case, critical for assistance with, positioning, exposure, retraction, instrumentation, and closure.The skin and subcutaneous tissue along the incision was injected with 20 cc of a mixture of 20cc Exparel and 30cc Marcaine 50cc saline solution, using a 21-gauge by 1-1/2 inch needle. We began the operation, with the knee flexed 130 degrees, by making the anterior midline incision starting at handbreadth above the patella going over the patella 1 cm medial to and 4 cm distal to the tibial tubercle. Small bleeders in the skin and the subcutaneous tissue identified and cauterized. Transverse retinaculum was incised and reflected medially and a medial parapatellar arthrotomy was accomplished. the patella was everted and theprepatellar fat pad resected. The superficial medial collateral ligament was then elevated from anterior to posterior along the proximal flare of the tibia and anterior half of the menisci resected. The knee was hyperflexed exposing bone on bone arthritis. Peripheral  and notch osteophytes as well as the cruciate ligaments were then resected. We continued to work our way around posteriorly along the proximal tibia, and externally rotated the tibia subluxing it out from underneath the femur. A McHale PCL retractor was placed through the notch, a lateral Hohmann retractor, and anterolateral small homan retractor placed. We then entered the proximal tibia with the Depuy starter drill in line with the axis of the tibia followed by an intramedullary guide rod and 3-degree posterior slope cutting guide. The tibial cutting guide, was pinned into place allowing resection of 3 mm of bone medially and 12 mm of bone laterally. Satisfied with the tibial resection, we then entered the distal femur 2 mm anterior to the PCL origin with the starter drill, followed by the intramedullary guide rod and applied the distal femoral cutting  guide set at 9 mm, with 5 degrees of valgus. This was pinned along the epicondylar axis. At this point, the distal femoral cut was accomplished without difficulty. We then sized for a #8L femoral component and pinned the chamfer guide in 3 degrees of external rotation. The anterior, posterior, and chamfer cuts were accomplished without difficulty followed by the Attune RP box cutting guide and the box cut. We also removed posterior osteophytes from the posterior femoral condyles. The posterior capsule was injected with Exparel solution. The knee was brought into full extension. We checked our extension gap and fit a 5 mm trial lollipop. Distracting in extension with a lamina spreader,  bleeders in the posterior capsule, Posterior medial and posterior lateral gutter were cauterized.  The transexamic acid-soaked sponge was then placed in the gap of the knee in extension. The knee was flexed 30. The posterior patella cut was accomplished with the 9.5 mm Attune cutting guide, sized for a 41 mm dome, and the fixation pegs drilled.The knee was then once again hyperflexed exposing the proximal tibia. We sized for a # 9 tibial base plate, applied the smokestack and the conical reamer followed by the the Delta fin keel punch. We then hammered into place the Attune RP trial femoral component, drilled the lugs, inserted a  5 mm trial bearing, trial patellar button, and took the knee through range of motion from 0-130 degrees. Medial and lateral ligamentous stability was checked. No thumb pressure was required for patellar Tracking.  All trial components were removed, mating surfaces irrigated with pulse lavage, and dried with suction and sponges. 10 cc of the Exparel solution was applied to the cancellus bone of the patella distal femur and proximal tibia.  After waiting 30 seconds, the bony surfaces were again, dried with sponges. A double batch of DePuy HV cement was mixed and applied to all bony metallic mating surfaces  except for the posterior condyles of the femur itself. In order, we hammered into place the tibial tray and removed excess cement, the femoral component and removed excess cement. The final Attune RP bearing was inserted, and the knee brought to full extension with compression. The patellar button was clamped into place, and excess cement removed. The knee was held at 30 flexion with compression using the second surefoot, while the cement cured. The wound was irrigated out with normal saline solution pulse lavage. The rest of the Exparel was injected into the parapatellar arthrotomy, subcutaneous tissues, and periosteal tissues. The parapatellar arthrotomy was closed with running #1 Vicryl suture. The subcutaneous tissue with 3-0 undyed Vicryl suture, and the skin with running 3-0 SQ vicryl. An Aquacil dressing and Ace wrap  were applied. The patient was taken to recovery room without difficulty.   Nestor Lewandowsky 09/04/2022, 7:02 AM

## 2022-09-04 NOTE — Anesthesia Preprocedure Evaluation (Signed)
Anesthesia Evaluation  Patient identified by MRN, date of birth, ID band Patient awake    Reviewed: Allergy & Precautions, NPO status , Patient's Chart, lab work & pertinent test results, reviewed documented beta blocker date and time   Airway Mallampati: II  TM Distance: >3 FB Neck ROM: Full    Dental no notable dental hx. (+) Caps, Dental Advisory Given   Pulmonary asthma , pneumonia, resolved, COPD,  COPD inhaler, former smoker   Pulmonary exam normal breath sounds clear to auscultation       Cardiovascular + Peripheral Vascular Disease  Normal cardiovascular exam Rhythm:Regular Rate:Normal     Neuro/Psych  PSYCHIATRIC DISORDERS  Depression     Neuromuscular disease    GI/Hepatic Neg liver ROS, hiatal hernia,GERD  Medicated,,Ulcerative colitis   Endo/Other  Hyperlipidemia  Renal/GU negative Renal ROS   BPH    Musculoskeletal  (+) Arthritis , Osteoarthritis,  OA right knee   Abdominal   Peds  Hematology negative hematology ROS (+)   Anesthesia Other Findings   Reproductive/Obstetrics                              Anesthesia Physical Anesthesia Plan  ASA: 2  Anesthesia Plan: Spinal   Post-op Pain Management: Regional block*, Minimal or no pain anticipated and Dilaudid IV   Induction: Intravenous  PONV Risk Score and Plan: 2 and Treatment may vary due to age or medical condition, Propofol infusion and Ondansetron  Airway Management Planned: Natural Airway and Simple Face Mask  Additional Equipment: None  Intra-op Plan:   Post-operative Plan:   Informed Consent: I have reviewed the patients History and Physical, chart, labs and discussed the procedure including the risks, benefits and alternatives for the proposed anesthesia with the patient or authorized representative who has indicated his/her understanding and acceptance.     Dental advisory given  Plan Discussed with:  CRNA and Anesthesiologist  Anesthesia Plan Comments:          Anesthesia Quick Evaluation

## 2022-09-04 NOTE — Transfer of Care (Signed)
Immediate Anesthesia Transfer of Care Note  Patient: [name]  Procedure(s) Performed: LEFT TOTAL KNEE ARTHROPLASTY (Left: Knee)  Patient Location: PACU  Anesthesia Type:Spinal  Level of Consciousness: awake, alert , and oriented  Airway & Oxygen Therapy: Patient Spontanous Breathing and Patient connected to face mask oxygen  Post-op Assessment: Report given to RN and Post -op Vital signs reviewed and stable  Post vital signs: Reviewed and stable  Last Vitals:  Vitals Value Taken Time  BP    Temp    Pulse 74 09/04/22 0953  Resp 16 09/04/22 0953  SpO2 99 % 09/04/22 0953  Vitals shown include unvalidated device data.  Last Pain:  Vitals:   09/04/22 0700  TempSrc:   PainSc: 0-No pain         Complications: No notable events documented.

## 2022-09-04 NOTE — Interval H&P Note (Signed)
History and Physical Interval Note:  09/04/2022 7:01 AM  [name]  has presented today for surgery, with the diagnosis of LEFT KNEE OSTEOARTHRITIS VARUS.  The various methods of treatment have been discussed with the patient and family. After consideration of risks, benefits and other options for treatment, the patient has consented to  Procedure(s): LEFT TOTAL KNEE ARTHROPLASTY (Left) as a surgical intervention.  The patient's history has been reviewed, patient examined, no change in status, stable for surgery.  I have reviewed the patient's chart and labs.  Questions were answered to the patient's satisfaction.     Kerin Salen

## 2022-09-04 NOTE — Anesthesia Procedure Notes (Addendum)
  Anesthesia Regional Block: Adductor canal block   Pre-Anesthetic Checklist: , timeout performed,  Correct Patient, Correct Site, Correct Laterality,  Correct Procedure, Correct Position, site marked,  Risks and benefits discussed,  Surgical consent,  Pre-op evaluation,  At surgeon's request and post-op pain management  Laterality: Left  Prep: chloraprep       Needles:  Injection technique: Single-shot  Needle Type: Echogenic Stimulator Needle     Needle Length: 10cm  Needle Gauge: 21   Needle insertion depth: 6 cm   Additional Needles:   Procedures:,,,, ultrasound used (permanent image in chart),,    Narrative:  Start time: 09/04/2022 7:10 AM End time: 09/04/2022 7:15 AM Injection made incrementally with aspirations every 5 mL.  Performed by: Personally  Anesthesiologist: Josephine Igo, MD  Additional Notes: Timeout performed. Patient sedated. Relevant anatomy ID'd using [place]. Incremental 2-18ml injection of LA with frequent aspiration. Patient tolerated procedure well.

## 2022-09-04 NOTE — Anesthesia Postprocedure Evaluation (Signed)
Anesthesia Post Note  Patient: [name]  Procedure(s) Performed: LEFT TOTAL KNEE ARTHROPLASTY (Left: Knee)     Patient location during evaluation: PACU Anesthesia Type: Spinal Level of consciousness: oriented and awake and alert Pain management: pain level controlled Vital Signs Assessment: post-procedure vital signs reviewed and stable Respiratory status: spontaneous breathing, respiratory function stable and nonlabored ventilation Cardiovascular status: blood pressure returned to baseline and stable Postop Assessment: no headache, no backache, no apparent nausea or vomiting, spinal receding and patient able to bend at knees Anesthetic complications: no   No notable events documented.  Last Vitals:  Vitals:   09/04/22 0954 09/04/22 1000  BP: 107/73 108/72  Pulse: 74 70  Resp: 16 18  Temp: 36.6 C   SpO2: 99% 99%    Last Pain:  Vitals:   09/04/22 1000  TempSrc:   PainSc: 0-No pain                 Devlon Dosher A.

## 2022-09-04 NOTE — Anesthesia Procedure Notes (Signed)
Procedure Name: MAC Date/Time: 09/04/2022 7:25 AM  Performed by: Maxwell Caul, CRNAPre-anesthesia Checklist: Patient identified, Emergency Drugs available, Suction available and Patient being monitored Oxygen Delivery Method: Simple face mask

## 2022-09-04 NOTE — Progress Notes (Signed)
Orthopedic Tech Progress Note Patient Details:  PRATEEK KNIPPLE Nov 25, 1940 [id number]  Ortho Devices Type of Ortho Device: Bone foam zero knee Ortho Device/Splint Location: LLE Ortho Device/Splint Interventions: Application   Post Interventions Patient Tolerated: Well  Alithia Zavaleta E Bretton Tandy 09/04/2022, 10:09 AM

## 2022-09-04 NOTE — Discharge Instructions (Addendum)

## 2022-09-05 ENCOUNTER — Encounter (HOSPITAL_COMMUNITY): Payer: Self-pay | Admitting: Orthopedic Surgery

## 2023-05-29 ENCOUNTER — Emergency Department (HOSPITAL_BASED_OUTPATIENT_CLINIC_OR_DEPARTMENT_OTHER)
Admission: EM | Admit: 2023-05-29 | Discharge: 2023-05-30 | Disposition: A | Discharge status: Home / Self Care | Payer: Medicare Other | Attending: Emergency Medicine | Admitting: Emergency Medicine

## 2023-05-29 ENCOUNTER — Emergency Department (HOSPITAL_BASED_OUTPATIENT_CLINIC_OR_DEPARTMENT_OTHER): Payer: Medicare Other

## 2023-05-29 ENCOUNTER — Other Ambulatory Visit: Payer: Self-pay

## 2023-05-29 ENCOUNTER — Encounter (HOSPITAL_BASED_OUTPATIENT_CLINIC_OR_DEPARTMENT_OTHER): Payer: Self-pay | Admitting: Emergency Medicine

## 2023-05-29 DIAGNOSIS — Z7982 Long term (current) use of aspirin: Secondary | ICD-10-CM | POA: Insufficient documentation

## 2023-05-29 DIAGNOSIS — I10 Essential (primary) hypertension: Secondary | ICD-10-CM | POA: Insufficient documentation

## 2023-05-29 DIAGNOSIS — Z87891 Personal history of nicotine dependence: Secondary | ICD-10-CM | POA: Diagnosis not present

## 2023-05-29 DIAGNOSIS — N202 Calculus of kidney with calculus of ureter: Secondary | ICD-10-CM | POA: Diagnosis not present

## 2023-05-29 DIAGNOSIS — J45909 Unspecified asthma, uncomplicated: Secondary | ICD-10-CM | POA: Insufficient documentation

## 2023-05-29 DIAGNOSIS — K449 Diaphragmatic hernia without obstruction or gangrene: Secondary | ICD-10-CM

## 2023-05-29 DIAGNOSIS — R109 Unspecified abdominal pain: Secondary | ICD-10-CM | POA: Diagnosis present

## 2023-05-29 DIAGNOSIS — N200 Calculus of kidney: Secondary | ICD-10-CM

## 2023-05-29 DIAGNOSIS — J449 Chronic obstructive pulmonary disease, unspecified: Secondary | ICD-10-CM | POA: Diagnosis not present

## 2023-05-29 DIAGNOSIS — Z96653 Presence of artificial knee joint, bilateral: Secondary | ICD-10-CM | POA: Insufficient documentation

## 2023-05-29 LAB — LIPASE, BLOOD: Lipase: 23 U/L (ref 11–51)

## 2023-05-29 LAB — URINALYSIS, ROUTINE W REFLEX MICROSCOPIC
Bilirubin Urine: NEGATIVE
Glucose, UA: NEGATIVE mg/dL
Ketones, ur: NEGATIVE mg/dL
Nitrite: NEGATIVE
Protein, ur: 100 mg/dL — AB
Specific Gravity, Urine: 1.025 (ref 1.005–1.030)
pH: 6 (ref 5.0–8.0)

## 2023-05-29 LAB — CBC WITH DIFFERENTIAL/PLATELET
Abs Immature Granulocytes: 0.11 10*3/uL — ABNORMAL HIGH (ref 0.00–0.07)
Basophils Absolute: 0.1 10*3/uL (ref 0.0–0.1)
Basophils Relative: 1 %
Eosinophils Absolute: 0.7 10*3/uL — ABNORMAL HIGH (ref 0.0–0.5)
Eosinophils Relative: 5 %
HCT: 42.4 % (ref 39.0–52.0)
Hemoglobin: 14.3 g/dL (ref 13.0–17.0)
Immature Granulocytes: 1 %
Lymphocytes Relative: 14 %
Lymphs Abs: 2 10*3/uL (ref 0.7–4.0)
MCH: 29.1 pg (ref 26.0–34.0)
MCHC: 33.7 g/dL (ref 30.0–36.0)
MCV: 86.4 fL (ref 80.0–100.0)
Monocytes Absolute: 1 10*3/uL (ref 0.1–1.0)
Monocytes Relative: 7 %
Neutro Abs: 10.7 10*3/uL — ABNORMAL HIGH (ref 1.7–7.7)
Neutrophils Relative %: 72 %
Platelets: 331 10*3/uL (ref 150–400)
RBC: 4.91 MIL/uL (ref 4.22–5.81)
RDW: 14.2 % (ref 11.5–15.5)
WBC: 14.6 10*3/uL — ABNORMAL HIGH (ref 4.0–10.5)
nRBC: 0 % (ref 0.0–0.2)

## 2023-05-29 LAB — URINALYSIS, MICROSCOPIC (REFLEX)

## 2023-05-29 LAB — COMPREHENSIVE METABOLIC PANEL
ALT: 23 U/L (ref 0–44)
AST: 20 U/L (ref 15–41)
Albumin: 3.8 g/dL (ref 3.5–5.0)
Alkaline Phosphatase: 66 U/L (ref 38–126)
Anion gap: 10 (ref 5–15)
BUN: 27 mg/dL — ABNORMAL HIGH (ref 8–23)
CO2: 23 mmol/L (ref 22–32)
Calcium: 9.1 mg/dL (ref 8.9–10.3)
Chloride: 103 mmol/L (ref 98–111)
Creatinine, Ser: 1.14 mg/dL (ref 0.61–1.24)
GFR, Estimated: >60 mL/min (ref >60)
Glucose, Bld: 113 mg/dL — ABNORMAL HIGH (ref 70–99)
Potassium: 4.1 mmol/L (ref 3.5–5.1)
Sodium: 136 mmol/L (ref 135–145)
Total Bilirubin: 0.6 mg/dL (ref 0.3–1.2)
Total Protein: 7.2 g/dL (ref 6.5–8.1)

## 2023-05-29 MED ORDER — IOHEXOL 300 MG/ML  SOLN
100.0000 mL | Freq: Once | INTRAMUSCULAR | Status: AC | PRN
  Administered 2023-05-29: 100 mL via INTRAVENOUS

## 2023-05-29 NOTE — ED Triage Notes (Addendum)
Right sided abdominal pain sudden onset around 2pm today. Describes pain as achy, constant. Endorses n/v. Denies diarrhea and fevers. Hx of UC. LBM this morning - normal.   States he has been on cipro x 6 days prescribed by urologist and is concerned for allergic reaction.

## 2023-05-30 MED ORDER — ONDANSETRON HCL 4 MG PO TABS: 4.0000 mg | ORAL_TABLET | ORAL | 0 refills | Status: AC | PRN

## 2023-05-30 MED ORDER — OXYCODONE HCL 5 MG PO TABS: 5.0000 mg | ORAL_TABLET | ORAL | 0 refills | Status: AC | PRN

## 2023-05-30 MED ORDER — CEPHALEXIN 500 MG PO CAPS: 500.0000 mg | ORAL_CAPSULE | Freq: Three times a day (TID) | ORAL | 0 refills | Status: AC

## 2023-05-30 NOTE — ED Notes (Signed)
Patient given water for PO challenge.  

## 2023-05-30 NOTE — ED Provider Notes (Signed)
[facility] Provider Note  CSN: [id number] Arrival date & time: 05/29/23 1804  Chief Complaint(s) Abdominal Pain  HPI [name] is a 82 y.o. male with past medical history as below, significant for COPD, GERD, MAC, ulcerative colitis who presents to the ED with complaint of abd pain, nausea and vomiting.  Patient reports she was started on ciprofloxacin by urologist Dr. Cardell Peach due to abnormality in "my blood."  He began having right-sided sharp, stabbing pain this evening, vomited once and had some nausea which is since subsided.  Pain has eased off since the initial onset without intervention.  Denies any change in bowel or bladder function otherwise.  No diarrhea.  No fevers or chills.  No chest pain or dyspnea.  He has chronic cough that is productive with green sputum associated with his asthma.  No change to his baseline cough, no increased work of breathing, no fevers or chills.  No rigors.  Past Medical History Past Medical History:  Diagnosis Date   Arthritis    "joints" (05/09/2018)   Asthma    Chronic bronchitis    COPD (chronic obstructive pulmonary disease) (HCC)    Depression    GERD (gastroesophageal reflux disease)    Hearing loss in left ear    High cholesterol    History of hiatal hernia    Lymphedema of left leg    "foot" (05/09/2018)   Mycobacterium avium complex (HCC)    Peripheral edema    Pneumonia 04/2018   pt. 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) 05/09/2018   Asthma 05/09/2018   Degenerative arthritis of left knee 08/13/2015   Chronic bronchitis with acute exacerbation (HCC) 09/23/2013   Atherosclerosis of native artery of extremity with intermittent claudication (HCC) 06/18/2012   Pain in limb 06/18/2012   Claudication (HCC) 05/30/2012   Frequency of urination 03/13/2012    General medical examination 05/29/2011   Myalgia 04/04/2011   DERMATOPHYTOSIS OF NAIL 09/22/2010   Inguinal hernia 09/22/2010   DECREASED HEARING 12/23/2009   RHINITIS 12/23/2009   UNSPECIFIED DISORDER OF SKIN&SUBCUTANEOUS TISSUE 12/23/2009   Hyperlipidemia 09/13/2009   LYMPHEDEMA 08/27/2009   ELEVATED BLOOD PRESSURE WITHOUT DIAGNOSIS OF HYPERTENSION 08/27/2009   BENIGN PROSTATIC HYPERTROPHY, MILD, HX OF 08/27/2009   OTHER CHRONIC SINUSITIS 08/09/2007   BRONCHITIS, CHRONIC 08/09/2007   HIATAL HERNIA 08/09/2007   Ulcerative colitis (HCC) 08/09/2007   Home Medication(s) Prior to Admission medications   Medication Sig Start Date End Date Taking? Authorizing Provider  cephALEXin (KEFLEX) 500 MG capsule Take 1 capsule (500 mg total) by mouth 3 (three) times daily for 5 days. 05/30/23 06/04/23 Yes Tanda Rockers A, DO  oxyCODONE (ROXICODONE) 5 MG immediate release tablet Take 1 tablet (5 mg total) by mouth every 4 (four) hours as needed for severe pain (pain score 7-10). 05/30/23  Yes Tanda Rockers A, DO  acetaminophen (TYLENOL) 500 MG tablet Take 500 mg by mouth every 8 (eight) hours as needed for moderate pain.    [provider]  albuterol (PROVENTIL HFA;VENTOLIN HFA) 108 (90 Base) MCG/ACT inhaler Inhale 2 puffs into the lungs every 4 (four) hours as needed for wheezing or shortness of breath. 02/25/16   Jetty Duhamel D, MD  aspirin EC 81 MG tablet Take 1 tablet (81 mg total) by mouth 2 (two) times daily. 09/04/22   Dannielle Burn  K, PA-C  Calcium Citrate-Vitamin D (CALCIUM + D PO) Take 1 tablet by mouth daily.    [provider]  cetirizine (ZYRTEC) 10 MG tablet Take 10 mg by mouth daily.    [provider]  Coenzyme Q10 (COQ-10 PO) Take 1 tablet by mouth daily.    [provider]  Cyanocobalamin (B-12 SL) Place 1 tablet under the tongue daily.    [provider]  diphenoxylate-atropine (LOMOTIL) 2.5-0.025 MG tablet Take 1 tablet by mouth 2 (two)  times daily as needed (ulcerative colitis flare ups).    [provider]  Ferrous Sulfate (IRON PO) Take 1 tablet by mouth daily.    [provider]  finasteride (PROSCAR) 5 MG tablet Take 5 mg by mouth daily.    [provider]  fluticasone (FLONASE) 50 MCG/ACT nasal spray Place 1 spray into both nostrils daily as needed for allergies or rhinitis.    [provider]  fluticasone-salmeterol (ADVAIR) 250-50 MCG/ACT AEPB Inhale 1 puff into the lungs in the morning and at bedtime.    [provider]  FOLIC ACID PO Take 1 tablet by mouth daily.    [provider]  guaiFENesin (MUCINEX) 600 MG 12 hr tablet Take 600 mg by mouth daily.    [provider]  loratadine (CLARITIN) 10 MG tablet Take 10 mg by mouth daily.    [provider]  montelukast (SINGULAIR) 10 MG tablet TAKE 1 TABLET BY MOUTH  EVERY EVENING Patient taking differently: Take 10 mg by mouth at bedtime. 01/29/17   Waymon Budge, MD  naphazoline-pheniramine (VISINE-A) 0.025-0.3 % ophthalmic solution Place 1 drop into both eyes 4 (four) times daily as needed for eye irritation (itchy eye).    [provider]  pantoprazole (PROTONIX) 40 MG tablet Take 40 mg by mouth daily.    [provider]  silodosin (RAPAFLO) 8 MG CAPS capsule Take 8 mg by mouth daily.    [provider]  simvastatin (ZOCOR) 20 MG tablet Take 20 mg by mouth every evening.     [provider]  tiZANidine (ZANAFLEX) 2 MG tablet Take 1 tablet (2 mg total) by mouth every 6 (six) hours as needed for muscle spasms. 09/04/22   Allena Katz, PA-C  vedolizumab (ENTYVIO) 300 MG injection Inject 300 mg into the vein See admin instructions. Every 60 days    [provider]  VITAMIN D PO Take 1 capsule by mouth daily.    [provider]                                                                                                                                     Past Surgical History Past Surgical History:  Procedure Laterality Date   BIOPSY  03/18/2019   Procedure: BIOPSY;  Surgeon: Bernette Redbird, MD;  Location: [facility];  Service: Endoscopy;;  Egd and Flex   CATARACT EXTRACTION  W/ INTRAOCULAR LENS  IMPLANT, BILATERAL Bilateral    COLONOSCOPY W/ BIOPSIES AND POLYPECTOMY     several with polys removed   ESOPHAGOGASTRODUODENOSCOPY (EGD) WITH PROPOFOL N/A 03/18/2019   Procedure: ESOPHAGOGASTRODUODENOSCOPY (EGD) WITH PROPOFOL;  Surgeon: Bernette Redbird, MD;  Location: [facility];  Service: Endoscopy;  Laterality: N/A;  Will need enteroscope    FLEXIBLE SIGMOIDOSCOPY N/A 03/18/2019   Procedure: FLEXIBLE SIGMOIDOSCOPY;  Surgeon: Bernette Redbird, MD;  Location: [facility];  Service: Endoscopy;  Laterality: N/A;  Unprepped   HERNIA REPAIR  2013   x 3 hernias repaired   JOINT REPLACEMENT     SHOULDER OPEN ROTATOR CUFF REPAIR Bilateral    TOTAL KNEE ARTHROPLASTY Right 08/13/2015   Procedure: RIGHT TOTAL KNEE ARTHROPLASTY;  Surgeon: Ollen Gross, MD;  Location: [facility];  Service: Orthopedics;  Laterality: Right;   TOTAL KNEE ARTHROPLASTY Left 09/04/2022   Procedure: LEFT TOTAL KNEE ARTHROPLASTY;  Surgeon: Gean Birchwood, MD;  Location: [facility];  Service: Orthopedics;  Laterality: Left;   Family History Family History  Problem Relation Age of Onset   Heart disease Mother        MI   Hyperlipidemia Mother    Other Mother        varicose veins   Heart attack Mother    COPD Sister    Hyperlipidemia Father    Hypertension Father     Social History Social History   Tobacco Use   Smoking status: Former    Current packs/day: 0.00    Average packs/day: 2.0 packs/day for 15.0 years (30.0 ttl pk-yrs)    Types: Cigarettes    Start date: 08/07/1956    Quit date: 08/08/1971    Years since quitting: 51.8   Smokeless tobacco: Former    Types: Chew    Quit date: 08/08/1971  Vaping Use   Vaping status: Never Used  Substance Use Topics   Alcohol  use: No   Drug use: Never   Allergies Lipitor [atorvastatin calcium], Penicillins, Ciprofloxacin, Molds & smuts, and Moxifloxacin  Review of Systems Review of Systems  Constitutional:  Negative for chills and fever.  Respiratory:  Positive for cough. Negative for chest tightness and shortness of breath.   Cardiovascular:  Negative for chest pain.  Gastrointestinal:  Positive for abdominal pain, nausea and vomiting. Negative for blood in stool, constipation and diarrhea.  Genitourinary:  Negative for decreased urine volume and hematuria.  Musculoskeletal:  Negative for arthralgias.  Skin:  Negative for rash and wound.  Neurological:  Negative for numbness and headaches.  All other systems reviewed and are negative.   Physical Exam Vital Signs  I have reviewed the triage vital signs BP (!) 153/73 (BP Location: Right Arm)   Pulse (!) 54   Temp 98.8 F (37.1 C) (Oral)   Resp 20   Ht 5\' 10"  (1.778 m)   Wt 74.8 kg   SpO2 97%   BMI 23.68 kg/m  Physical Exam Vitals and nursing note reviewed.  Constitutional:      General: He is not in acute distress.    Appearance: He is well-developed.  HENT:     Head: Normocephalic and atraumatic.     Right Ear: External ear normal.     Left Ear: External ear normal.     Mouth/Throat:     Mouth: Mucous membranes are moist.  Eyes:     General: No scleral icterus. Cardiovascular:     Rate and Rhythm: Normal rate and regular rhythm.     Pulses:  Normal pulses.     Heart sounds: Normal heart sounds.  Pulmonary:     Effort: Pulmonary effort is normal. No respiratory distress.     Breath sounds: Normal breath sounds.  Abdominal:     General: Abdomen is flat.     Palpations: Abdomen is soft.     Tenderness: There is no abdominal tenderness. There is no right CVA tenderness, left CVA tenderness, guarding or rebound.  Musculoskeletal:     Cervical back: No rigidity.     Right lower leg: No edema.     Left lower leg: No edema.  Skin:     General: Skin is warm and dry.     Capillary Refill: Capillary refill takes less than 2 seconds.  Neurological:     Mental Status: He is alert.  Psychiatric:        Mood and Affect: Mood normal.        Behavior: Behavior normal.     ED Results and Treatments Labs (all labs ordered are listed, but only abnormal results are displayed) Labs Reviewed  COMPREHENSIVE METABOLIC PANEL - Abnormal; Notable for the following components:      Result Value   Glucose, Bld 113 (*)    BUN 27 (*)    All other components within normal limits  URINALYSIS, ROUTINE W REFLEX MICROSCOPIC - Abnormal; Notable for the following components:   APPearance CLOUDY (*)    Hgb urine dipstick MODERATE (*)    Protein, ur 100 (*)    Leukocytes,Ua MODERATE (*)    All other components within normal limits  CBC WITH DIFFERENTIAL/PLATELET - Abnormal; Notable for the following components:   WBC 14.6 (*)    Neutro Abs 10.7 (*)    Eosinophils Absolute 0.7 (*)    Abs Immature Granulocytes 0.11 (*)    All other components within normal limits  URINALYSIS, MICROSCOPIC (REFLEX) - Abnormal; Notable for the following components:   Bacteria, UA FEW (*)    All other components within normal limits  URINE CULTURE  LIPASE, BLOOD                                                                                                                          Radiology CT ABDOMEN PELVIS W CONTRAST  Result Date: 05/30/2023 CLINICAL DATA:  Acute right lower quadrant abdominal pain. Sudden onset today. Nausea and vomiting. EXAM: CT ABDOMEN AND PELVIS WITH CONTRAST TECHNIQUE: Multidetector CT imaging of the abdomen and pelvis was performed using the standard protocol following bolus administration of intravenous contrast. RADIATION DOSE REDUCTION: This exam was performed according to the departmental dose-optimization program which includes automated exposure control, adjustment of the mA and/or kV according to patient size and/or use of  iterative reconstruction technique. CONTRAST:  OMNIPAQUE IOHEXOL 300 MG/ML  SOLN COMPARISON:  03/11/2019 FINDINGS: Lower chest: Consolidation and volume loss in the left lower lung likely pneumonia. Large esophageal hiatal hernia containing most of the stomach. Hepatobiliary: No focal liver abnormality is  seen. No gallstones, gallbladder wall thickening, or biliary dilatation. Pancreas: Unremarkable. No pancreatic ductal dilatation or surrounding inflammatory changes. Spleen: Normal in size without focal abnormality. Adrenals/Urinary Tract: No adrenal gland nodules. Parapelvic cysts in the left kidney no left hydronephrosis or hydroureter. Right renal hydronephrosis with a stone in the proximal right ureter at the level of L4 measuring 7.5 mm diameter. Prominent stranding around the right kidney and ureter. Additional 2 mm stone in the midpole of the right kidney. Distal right ureter is decompressed. Trabeculation of the bladder wall with small bladder diverticula likely due to chronic outlet obstruction. Stomach/Bowel: Stomach is within normal limits. Appendix appears normal. No evidence of bowel wall thickening, distention, or inflammatory changes. Vascular/Lymphatic: Aortic atherosclerosis. No enlarged abdominal or pelvic lymph nodes. Reproductive: Prostate is unremarkable. Other: No abdominal wall hernia or abnormality. No abdominopelvic ascites. Musculoskeletal: No acute or significant osseous findings. IMPRESSION: 1. 7.5 mm stone in the proximal right ureter at the level of L4 with moderate proximal obstruction. 2. Additional nonobstructing intrarenal stone on the right. 3. Left parapelvic cysts.  No imaging follow-up is indicated. 4. Consolidation and volume loss in the left lower lung, likely pneumonia. 5. Large esophageal hiatal hernia containing most of the stomach. 6. Aortic atherosclerosis. 7. Trabeculation and cellule formation of the bladder wall likely indicating chronic outlet obstruction.  Electronically Signed   By: Burman Nieves M.D.   On: 05/30/2023 00:13    Pertinent labs & imaging results that were available during my care of the patient were reviewed by me and considered in my medical decision making (see MDM for details).  Medications Ordered in ED Medications  iohexol (OMNIPAQUE) 300 MG/ML solution 100 mL (100 mLs Intravenous Contrast Given 05/29/23 2222)                                                                                                                                     Procedures Procedures  (including critical care time)  Medical Decision Making / ED Course    Medical Decision Making:    SELIG STEUER is a 82 y.o. male with past medical history as below, significant for COPD, GERD, MAC, ulcerative colitis who presents to the ED with complaint of abd pain, nausea and vomiting.. The complaint involves an extensive differential diagnosis and also carries with it a high risk of complications and morbidity.  Serious etiology was considered. Ddx includes but is not limited to: Differential diagnosis includes but is not exclusive to acute cholecystitis, intrathoracic causes for epigastric abdominal pain, gastritis, duodenitis, pancreatitis, small bowel or large bowel obstruction, abdominal aortic aneurysm, hernia, gastritis, etc.   Complete initial physical exam performed, notably the patient  was NAD, abd not peritoneal.    Reviewed and confirmed nursing documentation for past medical history, family history, social history.  Vital signs reviewed.    Clinical Course as of 05/30/23 0051  Wed May 30, 2023  0025 Per  radiology, CT abdomen pelvis with 7.5 mm stone in proximal right ureter with obstructive uropathy.  CT also concerning for pneumonia.  BUN/Creatinine is mildly elevated but no AKI, patient does have leukocytosis 14.6.  He is afebrile.  No hypoxia.  Urinalysis with blood, few bacteria is also positive leuks.  [SG]    Clinical Course User  Index [SG] Sloan Leiter, DO     He is overall well-appearing, symptoms greatly improved since the onset without intervention in the ER.  CT imaging concern for possible pneumonia, his exam is not consistent with pneumonia, he is having no new respiratory complaints.  No fevers.  Suspicion for pneumonia is reduced.  Large kidney stone noted on CT, creatinine is stable.  He is tolerant p.o. intake without difficulty.  No ongoing nausea and vomiting.  He sees urology Dr. Cardell Peach.  History of prior kidney stone.  Will give analgesics, antiemetic, empiric antibiotic given white blood cell count and some bacteria on his UA.  Suspicion for septic stone is low.  He recently was on a course of ciprofloxacin but did not take the last 2 tablets because he thought the pain was potentially from the antibiotic.  He threw away the antibiotics.  He is feeling much better, tolerating p.o., afebrile, nontoxic, not septic.  Symptoms likely secondary to nephrolithiasis.  Advised him to call his urologist later today for follow-up.  This is a fairly large kidney stone and require urological intervention to facilitate passage. Will provide analgesics / antibiotics in the interim.  He was recently on Cipro, will give a few more days of Keflex    The patient improved significantly and was discharged in stable condition. Detailed discussions were had with the patient regarding current findings, and need for close f/u with PCP or on call doctor. The patient has been instructed to return immediately if the symptoms worsen in any way for re-evaluation. Patient verbalized understanding and is in agreement with current care plan. All questions answered prior to discharge.                 Additional history obtained: -Additional history obtained from na -External records from outside source obtained and reviewed including: Chart review including previous notes, labs, imaging, consultation notes including  Prior  cultures, home medications, prior labs and imaging   Lab Tests: -I ordered, reviewed, and interpreted labs.   The pertinent results include:   Labs Reviewed  COMPREHENSIVE METABOLIC PANEL - Abnormal; Notable for the following components:      Result Value   Glucose, Bld 113 (*)    BUN 27 (*)    All other components within normal limits  URINALYSIS, ROUTINE W REFLEX MICROSCOPIC - Abnormal; Notable for the following components:   APPearance CLOUDY (*)    Hgb urine dipstick MODERATE (*)    Protein, ur 100 (*)    Leukocytes,Ua MODERATE (*)    All other components within normal limits  CBC WITH DIFFERENTIAL/PLATELET - Abnormal; Notable for the following components:   WBC 14.6 (*)    Neutro Abs 10.7 (*)    Eosinophils Absolute 0.7 (*)    Abs Immature Granulocytes 0.11 (*)    All other components within normal limits  URINALYSIS, MICROSCOPIC (REFLEX) - Abnormal; Notable for the following components:   Bacteria, UA FEW (*)    All other components within normal limits  URINE CULTURE  LIPASE, BLOOD    Notable for as above  EKG   EKG Interpretation Date/Time:    Ventricular  Rate:    PR Interval:    QRS Duration:    QT Interval:    QTC Calculation:   R Axis:      Text Interpretation:           Imaging Studies ordered: I ordered imaging studies including CT abdomen pelvis I independently visualized the following imaging with scope of interpretation limited to determining acute life threatening conditions related to emergency care; findings noted above I independently visualized and interpreted imaging. I agree with the radiologist interpretation   Medicines ordered and prescription drug management: Meds ordered this encounter  Medications   iohexol (OMNIPAQUE) 300 MG/ML solution 100 mL   oxyCODONE (ROXICODONE) 5 MG immediate release tablet    Sig: Take 1 tablet (5 mg total) by mouth every 4 (four) hours as needed for severe pain (pain score 7-10).    Dispense:  10  tablet    Refill:  0   cephALEXin (KEFLEX) 500 MG capsule    Sig: Take 1 capsule (500 mg total) by mouth 3 (three) times daily for 5 days.    Dispense:  15 capsule    Refill:  0    -I have reviewed the patients home medicines and have made adjustments as needed   Consultations Obtained: Not applicable  Cardiac Monitoring: Continuous pulse oximetry interpreted by myself, 99% on RA.    Social Determinants of Health:  Diagnosis or treatment significantly limited by social determinants of health: former smoker   Reevaluation: After the interventions noted above, I reevaluated the patient and found that they have improved  Co morbidities that complicate the patient evaluation  Past Medical History:  Diagnosis Date   Arthritis    "joints" (05/09/2018)   Asthma    Chronic bronchitis    COPD (chronic obstructive pulmonary disease) (HCC)    Depression    GERD (gastroesophageal reflux disease)    Hearing loss in left ear    High cholesterol    History of hiatal hernia    Lymphedema of left leg    "foot" (05/09/2018)   Mycobacterium avium complex (HCC)    Peripheral edema    Pneumonia 04/2018   pt. denies   Sinusitis    Ulcerative colitis       Dispostion: Disposition decision including need for hospitalization was considered, and patient discharged from emergency department.    Final Clinical Impression(s) / ED Diagnoses Final diagnoses:  Nephrolithiasis  Hiatal hernia  Abdominal pain, unspecified abdominal location        Sloan Leiter, DO 05/30/23 2595

## 2023-05-30 NOTE — Discharge Instructions (Addendum)
You have a large kidney stone on the right side which is likely responsible for your discomfort today.  Please call urologist later today to arrange follow-up.  Drink plenty of fluids.  Please return if you experience severe abdominal pain, fever, nausea and vomiting unrelieved with medication.  Any worsening or worrisome symptoms otherwise  It was a pleasure caring for you today in the emergency department.

## 2023-05-31 ENCOUNTER — Other Ambulatory Visit: Payer: Self-pay | Admitting: Urology

## 2023-05-31 LAB — URINE CULTURE: Culture: NO GROWTH

## 2023-06-01 ENCOUNTER — Encounter (HOSPITAL_BASED_OUTPATIENT_CLINIC_OR_DEPARTMENT_OTHER): Payer: Self-pay | Admitting: Anesthesiology

## 2023-06-01 ENCOUNTER — Encounter (HOSPITAL_BASED_OUTPATIENT_CLINIC_OR_DEPARTMENT_OTHER): Payer: Self-pay | Admitting: Urology

## 2023-06-01 NOTE — Progress Notes (Signed)
Chart noted to have Tuberculosis Flag, client has hx of respiratory illness. Consulted with Dr. Judie Petit. Malen Gauze, MD / Anesthesiologist, who reviewed chart, labs and other notes. Dr Malen Gauze, MD stated from Anesthesia point patient may proceed with Litho without anesthesia monitoring.

## 2023-06-01 NOTE — Progress Notes (Signed)
Spoke with Margretta Ditty with [facility], and she states there is no need for pulmonary clearance unless patient is on oxygen 24 hrs/day, or has difficulty breathing.

## 2023-06-01 NOTE — Progress Notes (Signed)
Pre-op phone call complete. Procedure date and arrival time confirmed (06/04/2023 at 0600). Patient allergies, medical history, and medications verified. Patient denied any recent history of chest pain, COVID, or trouble breathing. Uses inhaler as needed, but does not use home oxygen. Advised not to take anymore aspirin. Per patient last dose was 05/30/2023. Advised to take laxative day prior to procedure. Patient to stop all vitamins. NPO after midnight. Driver secured.

## 2023-06-03 NOTE — H&P (Signed)
Office Visit Report     05/31/2023   --------------------------------------------------------------------------------   [name]  MRN: [id number]  DOB: 10-07-1940, 82 year old Male  SSN: [id number]   PRIMARY CARE:  Bayard Beaver, MD  PRIMARY CARE FAX:  [phone]  REFERRING:  Jannifer Hick, MD  PROVIDER:  Jettie Pagan, M.D.  LOCATION:  [facility] [phone]     --------------------------------------------------------------------------------   CC/HPI: [name] is an 82 year old male who is seen in follow-up with history of BPH/LUTS and recent urolithiasis.   1. BPH/LUTS:  He has a long history of BPH managed with silodosin 8 mg and finasteride 5 mg daily. He states that this is therapy, he is very satisfied with strong flow stream. IPSS score today is 18, quality-of-life 3. PVR today 63 mL. At this time, he is not interested in bladder outlet obstruction procedure.   He has had prior prostate examinations. He has never had a prostate biopsy. He has never had known prostate nodules. Given his age, he elects to forego additional prostate cancer screening.   2. Right ureteral stone:  -He developed right-sided flank pain and presented to the ER on 05/29/2023 were CT scan revealed 7 mm stone in the proximal right ureter at the level of L4 with moderate proximal obstruction. He also additionally has nonobstructing intrarenal small right renal stone.  -His pain is very well-controlled. He denies dysuria, fevers, chills. Urine culture 05/29/2023 resulted no growth.  -KUB 05/31/2023 with evidence of large 7 mm right proximal ureteral stone at the level of L4. He like to proceed with ESWL.   He denies history of gross hematuria. He quit smoking 1975. He denies family history of urologic malignancy. He has not had a prior cystoscopy.     ALLERGIES: Avelox TABS Ciprofloxacin Lipitor TABS Moxifloxacin Penicillin Rivaroxaban    MEDICATIONS:  Finasteride 5 mg tablet 1 tablet PO Daily  Silodosin 8 mg capsule TAKE ONE CAPSULE BY MOUTH ONE TIME DAILY  Advair Diskus 100 mcg-50 mcg/dose blister, with inhalation device Inhalation  Aspirin Ec 81 mg tablet, delayed release Oral  Budesonide Ec 3 mg capsule, delayed, and extended release Oral  Caltrate 600 600 mg calcium (1,500 mg) tablet Oral  Cephalexin 500 mg capsule 1 capsule PO Daily  Claritin 10 MG Oral Tablet Oral  Coenzyme Q10 200 mg capsule Oral  Folic Acid 1 mg tablet Oral  Methotrexate 25 mg/ml vial Injection  Montelukast Sodium 10 mg tablet Oral  Multi-Day tablet Oral  Nasonex 50 MCG/ACT Nasal Suspension Nasal  Ondansetron Hcl 4 mg tablet 1 tablet PO Daily  Oxycodone Hcl 5 mg tablet 1 tablet PO Daily  Oxycodone-Acetaminophen 5 mg-325 mg tablet Oral  Pantoprazole Sodium 40 mg tablet, delayed release Oral  Prednisone 50 mg tablet Oral  Protonix  Simvastatin 20 mg tablet Oral  Vitamin B-12 100 mcg tablet Oral     GU PSH: No GU PSH      PSH Notes: Rotator Cuff Repair, Hernia Repair   NON-GU PSH: Hernia Repair - 2013 Total Knee Replacement, Bilateral Visit Complexity (formerly GPC1X) - 05/23/2023     GU PMH: BPH w/LUTS - 05/23/2023, - 11/22/2022, - 05/22/2022, - 2023, - 2023, - 07/25/2021, Benign prostatic hyperplasia with urinary obstruction, - 2016 Dysuria - 05/23/2023, - 05/22/2022 Incomplete bladder emptying - 05/23/2023, - 11/22/2022 Urinary Retention - 09/12/2022, - 09/05/2022 Nocturia - 05/22/2022, - 2023, - 2023, - 07/25/2021, Nocturia, - 2014 Renal calculus - 2023, - 07/25/2021, Renal calculus,  bilateral, - 2017 BPH w/o LUTS, He has BPH by exam. He does not have any significant voiding symptoms at this time. He does take tamsulosin 0.4 mg. - 2019 Encounter for Prostate Cancer screening (Stable), His prostate was noted to be smooth and benign although somewhat enlarged. His PSA at 1.36 remains normal. - 2019, (Stable), His prostate is slightly enlarged but it is  smooth and symmetric with no nodularity or induration. In addition his PSA remains low at 1.56. I will continue yearly DRE and PSA., - 2018, Prostate cancer screening, - 2017      PMH Notes: LUTS: He has a history of urinary frequency with associated nocturia x2 and a decreased force of urinary stream. He has experienced initial dysuria at times. His symptoms are improved with tamsulosin which he takes BID.  Current therapy: Tamsulosin 0.8 mg    Calculus disease: CT 02/05/14 - Rt. renal calculi and a 5.44mm left ureteral stone (~900 HU) which he passed.     NON-GU PMH: Pyuria/other UA findings - 11/22/2022 Encounter for general adult medical examination without abnormal findings, Encounter for preventive health examination - 2016 Personal history of other diseases of the digestive system, History of hiatal hernia - 2016, History of ulcerative colitis, - 2014 Asthma, Asthma - 2014 Lymphedema, not elsewhere classified, Lymphedema - 2014 Personal history of other diseases of the nervous system and sense organs, History of sleep apnea - 2014, History of glaucoma, - 2014 Personal history of other endocrine, nutritional and metabolic disease, History of hypercholesterolemia - 2014    FAMILY HISTORY: Death In The Family Father - Runs In Family Death In The Family Mother - Runs In Family Heart Attack - Mother No pertinent family history - Other   SOCIAL HISTORY: Marital Status: Married Preferred Language: English; Ethnicity: Not Hispanic Or Latino; Race: White Current Smoking Status: Patient does not smoke anymore. Has not smoked since 10/02/1972.   Tobacco Use Assessment Completed: Used Tobacco in last 30 days? Does not drink anymore.  Drinks 2 caffeinated drinks per day.     Notes: Former smoker, Tobacco use, Occupation: Retired, Alcohol Use, Caffeine Use, Marital History - Currently Married   REVIEW OF SYSTEMS:    GU Review Male:   Patient reports frequent urination, hard to postpone  urination, burning/ pain with urination, and get up at night to urinate. Patient denies leakage of urine, stream starts and stops, trouble starting your stream, have to strain to urinate , erection problems, and penile pain.  Gastrointestinal (Upper):   Patient reports vomiting. Patient denies indigestion/ heartburn and nausea.  Gastrointestinal (Lower):   Patient denies diarrhea and constipation.  Constitutional:   Patient reports fatigue. Patient denies fever, night sweats, and weight loss.  Skin:   Patient denies skin rash/ lesion and itching.  Eyes:   Patient denies blurred vision and double vision.  Ears/ Nose/ Throat:   Patient denies sore throat and sinus problems.  Hematologic/Lymphatic:   Patient denies swollen glands and easy bruising.  Cardiovascular:   Patient reports leg swelling. Patient denies chest pains.  Respiratory:   Patient denies cough and shortness of breath.  Endocrine:   Patient denies excessive thirst.  Musculoskeletal:   Patient reports back pain. Patient denies joint pain.  Neurological:   Patient denies headaches and dizziness.  Psychologic:   Patient denies depression and anxiety.   Notes: kidney possible     VITAL SIGNS:      05/31/2023 03:41 PM  Weight 165 lb / 74.84 kg  Height  70 in / 177.8 cm  BP 125/79 mmHg  Pulse 77 /min  Temperature 98.0 F / 36.6 C  BMI 23.7 kg/m   MULTI-SYSTEM PHYSICAL EXAMINATION:    Constitutional: Well-nourished. No physical deformities. Normally developed. Good grooming.  Respiratory: No labored breathing, no use of accessory muscles.   Cardiovascular: Normal temperature, normal extremity pulses, no swelling, no varicosities.  Gastrointestinal: No mass, no tenderness, no rigidity, non obese abdomen.     Complexity of Data:  Source Of History:  Patient, Medical Record Summary  Records Review:   Previous Doctor Records, Previous Patient Records  Urine Test Review:   Urinalysis  X-Ray Review: C.T. Abdomen/Pelvis: Reviewed  Films. Reviewed Report. Discussed With Patient.     09/18/17 09/19/16 09/29/15 09/02/14 04/09/13 04/09/12  PSA  Total PSA 1.36 ng/mL 1.56 ng/dl 8.29  5.62  1.30  8.65     PROCEDURES:         KUB - 74018  A single view of the abdomen is obtained.      Patient confirmed No Neulasta OnPro Device.           Visit Complexity - G2211          Urinalysis w/Scope Dipstick Dipstick Cont'd Micro  Color: Yellow Bilirubin: Neg mg/dL WBC/hpf: 10 - 78/ION  Appearance: Slightly Cloudy Ketones: Neg mg/dL RBC/hpf: 3 - 62/XBM  Specific Gravity: 1.025 Blood: 2+ ery/uL Bacteria: Few (10-25/hpf)  pH: <=5.0 Protein: Trace mg/dL Cystals: NS (Not Seen)  Glucose: Neg mg/dL Urobilinogen: 0.2 mg/dL Casts: NS (Not Seen)    Nitrites: Neg Trichomonas: Not Present    Leukocyte Esterase: 2+ leu/uL Mucous: Not Present      Epithelial Cells: NS (Not Seen)      Yeast: NS (Not Seen)      Sperm: Not Present    ASSESSMENT:      ICD-10 Details  1 GU:   Ureteral calculus - N20.1   2   BPH w/LUTS - N40.1   3   Incomplete bladder emptying - R39.14    PLAN:           Orders X-Rays: KUB          Schedule         Document Letter(s):  Created for Patient: Clinical Summary         Notes:    #1. BPH is LUTS: Continue silodosin and finasteride.   #2. Right ureteral stone:  -7 mm right ureteral stone seen on KUB today. We discussed options including ESWL and ureteroscopy with stone extraction. He referred to proceed with ESWL. KUB today with evidence of stone at the L4 level. Discussed risk and benefits. Sure letter submitted. He does take aspirin will have to hold this prior. He last took aspirin on the evening of 05/30/2023.   We discussed the options for management of kidney stones, including observation, ESWL, ureteroscopy with laser lithotripsy, and PCNL. The risks and benefits of each option were discussed.  For observation I described the risks which include but are not limited to silent renal  damage, life-threatening infection, need for emergent surgery, failure to pass stone, and pain.   ESWL: risks and benefits of ESWL were outlined including infection, bleeding, pain, steinstrasse, kidney injury, need for ancillary treatments, and global anesthesia risks including but not limited to CVA, MI, DVT, PE, pneumonia, and death.   Ureteroscopy: risks and benefits of ureteroscopy were outlined, including infection, bleeding, pain, temporary ureteral stent and associated stent bother, ureteral injury, ureteral stricture,  need for ancillary treatments, and global anesthesia risks including but not limited to CVA, MI, DVT, PE, pneumonia, and death.   PCNL: risks and benefits of PCNL were outlined including infection, bleeding, blood transfusion, pain, pneumothorax, bowel injury, persistent urine leak, positioning injury, inability to clear stone burden, renal laceration, arterial venous fistula or malformation, need for ancillary treatments, and global anesthesia risks including but not limited to CVA, MI, DVT, PE, pneumonia, and death.   CC: Bayard Beaver, MD        Next Appointment:      Next Appointment: 06/04/2023 07:30 AM    Appointment Type: Surgery     Location: [facility] [phone]    Provider: Heloise Purpura, M.D.    Reason for Visit: NE/OP (R) ESWL      * Signed by Jettie Pagan, M.D. on 05/31/23 at 4:49 PM (EDT)*

## 2023-06-04 ENCOUNTER — Ambulatory Visit (HOSPITAL_BASED_OUTPATIENT_CLINIC_OR_DEPARTMENT_OTHER)
Admission: RE | Admit: 2023-06-04 | Discharge: 2023-06-04 | Disposition: A | Discharge status: Home / Self Care | Payer: Medicare Other | Source: Home / Self Care | Attending: Urology | Admitting: Urology

## 2023-06-04 ENCOUNTER — Encounter (HOSPITAL_BASED_OUTPATIENT_CLINIC_OR_DEPARTMENT_OTHER): Payer: Self-pay | Admitting: Urology

## 2023-06-04 ENCOUNTER — Ambulatory Visit (HOSPITAL_COMMUNITY): Payer: Medicare Other

## 2023-06-04 ENCOUNTER — Encounter (HOSPITAL_BASED_OUTPATIENT_CLINIC_OR_DEPARTMENT_OTHER)
Admission: RE | Disposition: A | Discharge status: Home / Self Care | Payer: Self-pay | Source: Home / Self Care | Attending: Urology

## 2023-06-04 DIAGNOSIS — N202 Calculus of kidney with calculus of ureter: Secondary | ICD-10-CM | POA: Insufficient documentation

## 2023-06-04 DIAGNOSIS — N401 Enlarged prostate with lower urinary tract symptoms: Secondary | ICD-10-CM | POA: Insufficient documentation

## 2023-06-04 DIAGNOSIS — R338 Other retention of urine: Secondary | ICD-10-CM | POA: Insufficient documentation

## 2023-06-04 DIAGNOSIS — Z79899 Other long term (current) drug therapy: Secondary | ICD-10-CM | POA: Insufficient documentation

## 2023-06-04 DIAGNOSIS — Z87891 Personal history of nicotine dependence: Secondary | ICD-10-CM | POA: Insufficient documentation

## 2023-06-04 HISTORY — PX: EXTRACORPOREAL SHOCK WAVE LITHOTRIPSY: SHX1557

## 2023-06-04 SURGERY — LITHOTRIPSY, ESWL
Anesthesia: LOCAL | Laterality: Right

## 2023-06-04 MED ORDER — DIPHENHYDRAMINE HCL 25 MG PO CAPS
25.0000 mg | ORAL_CAPSULE | ORAL | Status: AC
  Administered 2023-06-04: 25 mg via ORAL

## 2023-06-04 MED ORDER — DIAZEPAM 5 MG PO TABS
ORAL_TABLET | ORAL | Status: AC
  Filled 2023-06-04: qty 2

## 2023-06-04 MED ORDER — DIAZEPAM 5 MG PO TABS
10.0000 mg | ORAL_TABLET | ORAL | Status: AC
  Administered 2023-06-04: 10 mg via ORAL

## 2023-06-04 MED ORDER — DIPHENHYDRAMINE HCL 25 MG PO CAPS
ORAL_CAPSULE | ORAL | Status: AC
  Filled 2023-06-04: qty 1

## 2023-06-04 MED ORDER — GENTAMICIN SULFATE 40 MG/ML IJ SOLN
5.0000 mg/kg | Freq: Once | INTRAVENOUS | Status: AC
  Administered 2023-06-04: 370 mg via INTRAVENOUS
  Filled 2023-06-04: qty 9.25

## 2023-06-04 MED ORDER — SODIUM CHLORIDE 0.9 % IV SOLN: INTRAVENOUS | Status: DC

## 2023-06-04 NOTE — Progress Notes (Signed)
Arrival to Phase II PACU post Litho, arrived via wc, pt alert but very unsteady gait is noted, required assistance to ambulate to restroom to void post procedure, denies any pain or discomfort at this time. Placed in recliner in Phase II area, POs offered. VS obtained, IV site WNL

## 2023-06-04 NOTE — Interval H&P Note (Signed)
History and Physical Interval Note:  06/04/2023 7:37 AM  [name]  has presented today for surgery, with the diagnosis of RIGHT URETERAL STONE.  The various methods of treatment have been discussed with the patient and family. After consideration of risks, benefits and other options for treatment, the patient has consented to  Procedure(s) with comments: RIGHT EXTRACORPOREAL SHOCK WAVE LITHOTRIPSY (ESWL) (Right) - 75 MINUTES NEEDED FOR CASE as a surgical intervention.  The patient's history has been reviewed, patient examined, no change in status, stable for surgery.  I have reviewed the patient's chart and labs.  Questions were answered to the patient's satisfaction.     [employer]

## 2023-06-04 NOTE — Discharge Instructions (Addendum)
1. You should strain your urine and collect all fragments and bring them to your follow up appointment.  2. You should take your pain medication as needed.  Please call if your pain is severe to the point that it is not controlled with your pain medication. 3. You should call if you develop fever > 101 or persistent nausea or vomiting.   Post Anesthesia Home Care Instructions  Activity: Get plenty of rest for the remainder of the day. A responsible individual must stay with you for 24 hours following the procedure.  For the next 24 hours, DO NOT: -Drive a car -Advertising copywriter -Drink alcoholic beverages -Take any medication unless instructed by your physician -Make any legal decisions or sign important papers.  Meals: Start with liquid foods such as gelatin or soup. Progress to regular foods as tolerated. Avoid greasy, spicy, heavy foods. If nausea and/or vomiting occur, drink only clear liquids until the nausea and/or vomiting subsides. Call your physician if vomiting continues. Drink plenty of water for the next 24 hours

## 2023-06-04 NOTE — Op Note (Signed)
See [facility] operative note scanned into chart. Also because of the size, density, location and other factors that cannot be anticipated I feel this will likely be a staged procedure. This fact supersedes any indication in the scanned [facility] operative note to the contrary.  

## 2023-06-05 ENCOUNTER — Inpatient Hospital Stay (HOSPITAL_COMMUNITY): Payer: Medicare Other

## 2023-06-05 ENCOUNTER — Inpatient Hospital Stay (HOSPITAL_COMMUNITY): Payer: Medicare Other | Admitting: Anesthesiology

## 2023-06-05 ENCOUNTER — Encounter (HOSPITAL_BASED_OUTPATIENT_CLINIC_OR_DEPARTMENT_OTHER): Payer: Self-pay | Admitting: Urology

## 2023-06-05 ENCOUNTER — Other Ambulatory Visit: Payer: Self-pay | Admitting: Urology

## 2023-06-05 ENCOUNTER — Other Ambulatory Visit: Payer: Self-pay

## 2023-06-05 ENCOUNTER — Encounter (HOSPITAL_COMMUNITY): Disposition: A | Discharge status: Home / Self Care | Payer: Self-pay | Attending: Urology

## 2023-06-05 ENCOUNTER — Inpatient Hospital Stay (HOSPITAL_COMMUNITY)
Admit: 2023-06-05 | Discharge: 2023-06-08 | DRG: 854 | Disposition: A | Discharge status: Home / Self Care | Payer: Medicare Other | Attending: Urology | Admitting: Urology

## 2023-06-05 DIAGNOSIS — F32A Depression, unspecified: Secondary | ICD-10-CM | POA: Diagnosis present

## 2023-06-05 DIAGNOSIS — K449 Diaphragmatic hernia without obstruction or gangrene: Secondary | ICD-10-CM | POA: Diagnosis present

## 2023-06-05 DIAGNOSIS — I739 Peripheral vascular disease, unspecified: Secondary | ICD-10-CM | POA: Diagnosis present

## 2023-06-05 DIAGNOSIS — B377 Candidal sepsis: Secondary | ICD-10-CM | POA: Diagnosis present

## 2023-06-05 DIAGNOSIS — Z9109 Other allergy status, other than to drugs and biological substances: Secondary | ICD-10-CM

## 2023-06-05 DIAGNOSIS — Z888 Allergy status to other drugs, medicaments and biological substances status: Secondary | ICD-10-CM

## 2023-06-05 DIAGNOSIS — H9192 Unspecified hearing loss, left ear: Secondary | ICD-10-CM | POA: Diagnosis present

## 2023-06-05 DIAGNOSIS — N400 Enlarged prostate without lower urinary tract symptoms: Secondary | ICD-10-CM | POA: Diagnosis present

## 2023-06-05 DIAGNOSIS — N390 Urinary tract infection, site not specified: Principal | ICD-10-CM | POA: Diagnosis present

## 2023-06-05 DIAGNOSIS — Z87442 Personal history of urinary calculi: Secondary | ICD-10-CM | POA: Diagnosis not present

## 2023-06-05 DIAGNOSIS — Z825 Family history of asthma and other chronic lower respiratory diseases: Secondary | ICD-10-CM | POA: Diagnosis not present

## 2023-06-05 DIAGNOSIS — N201 Calculus of ureter: Secondary | ICD-10-CM

## 2023-06-05 DIAGNOSIS — J4489 Other specified chronic obstructive pulmonary disease: Secondary | ICD-10-CM | POA: Diagnosis present

## 2023-06-05 DIAGNOSIS — E7800 Pure hypercholesterolemia, unspecified: Secondary | ICD-10-CM | POA: Diagnosis present

## 2023-06-05 DIAGNOSIS — J449 Chronic obstructive pulmonary disease, unspecified: Secondary | ICD-10-CM

## 2023-06-05 DIAGNOSIS — Z83438 Family history of other disorder of lipoprotein metabolism and other lipidemia: Secondary | ICD-10-CM

## 2023-06-05 DIAGNOSIS — K219 Gastro-esophageal reflux disease without esophagitis: Secondary | ICD-10-CM | POA: Diagnosis present

## 2023-06-05 DIAGNOSIS — B49 Unspecified mycosis: Secondary | ICD-10-CM | POA: Diagnosis not present

## 2023-06-05 DIAGNOSIS — N136 Pyonephrosis: Secondary | ICD-10-CM | POA: Diagnosis present

## 2023-06-05 DIAGNOSIS — R7881 Bacteremia: Secondary | ICD-10-CM | POA: Diagnosis not present

## 2023-06-05 DIAGNOSIS — Z880 Allergy status to penicillin: Secondary | ICD-10-CM | POA: Diagnosis not present

## 2023-06-05 DIAGNOSIS — Z882 Allergy status to sulfonamides status: Secondary | ICD-10-CM

## 2023-06-05 DIAGNOSIS — Z87891 Personal history of nicotine dependence: Secondary | ICD-10-CM | POA: Diagnosis not present

## 2023-06-05 DIAGNOSIS — N10 Acute tubulo-interstitial nephritis: Principal | ICD-10-CM

## 2023-06-05 DIAGNOSIS — Z8249 Family history of ischemic heart disease and other diseases of the circulatory system: Secondary | ICD-10-CM | POA: Diagnosis not present

## 2023-06-05 DIAGNOSIS — Z96653 Presence of artificial knee joint, bilateral: Secondary | ICD-10-CM | POA: Diagnosis present

## 2023-06-05 HISTORY — PX: CYSTOSCOPY WITH STENT PLACEMENT: SHX5790

## 2023-06-05 SURGERY — CYSTOSCOPY, WITH STENT INSERTION
Anesthesia: General | Laterality: Right

## 2023-06-05 MED ORDER — FLUTICASONE PROPIONATE 50 MCG/ACT NA SUSP: 1.0000 | Freq: Every day | NASAL | Status: DC | PRN

## 2023-06-05 MED ORDER — ACETAMINOPHEN 10 MG/ML IV SOLN: 1000.0000 mg | Freq: Once | INTRAVENOUS | Status: DC | PRN

## 2023-06-05 MED ORDER — ORAL CARE MOUTH RINSE: 15.0000 mL | Freq: Once | OROMUCOSAL | Status: AC

## 2023-06-05 MED ORDER — ONDANSETRON HCL 4 MG/2ML IJ SOLN
INTRAMUSCULAR | Status: AC
  Filled 2023-06-05: qty 2

## 2023-06-05 MED ORDER — MONTELUKAST SODIUM 10 MG PO TABS
10.0000 mg | ORAL_TABLET | Freq: Every day | ORAL | Status: DC
  Administered 2023-06-05 – 2023-06-07 (×3): 10 mg via ORAL
  Filled 2023-06-05 (×3): qty 1

## 2023-06-05 MED ORDER — CEFTRIAXONE SODIUM 2 G IJ SOLR
2.0000 g | INTRAMUSCULAR | Status: DC
  Administered 2023-06-06: 2 g via INTRAVENOUS
  Filled 2023-06-05: qty 20

## 2023-06-05 MED ORDER — MORPHINE SULFATE (PF) 2 MG/ML IV SOLN: 2.0000 mg | INTRAVENOUS | Status: DC | PRN

## 2023-06-05 MED ORDER — SODIUM CHLORIDE 0.9 % IR SOLN
Status: DC | PRN
  Administered 2023-06-05: 3000 mL via INTRAVESICAL

## 2023-06-05 MED ORDER — MOMETASONE FURO-FORMOTEROL FUM 200-5 MCG/ACT IN AERO
2.0000 | INHALATION_SPRAY | Freq: Two times a day (BID) | RESPIRATORY_TRACT | Status: DC
  Administered 2023-06-05 – 2023-06-08 (×6): 2 via RESPIRATORY_TRACT
  Filled 2023-06-05: qty 8.8

## 2023-06-05 MED ORDER — ZOLPIDEM TARTRATE 5 MG PO TABS: 5.0000 mg | ORAL_TABLET | Freq: Every evening | ORAL | Status: DC | PRN

## 2023-06-05 MED ORDER — FLUCONAZOLE 100 MG PO TABS: 200.0000 mg | ORAL_TABLET | Freq: Every day | ORAL | Status: DC

## 2023-06-05 MED ORDER — FENTANYL CITRATE (PF) 100 MCG/2ML IJ SOLN
INTRAMUSCULAR | Status: AC
  Filled 2023-06-05: qty 2

## 2023-06-05 MED ORDER — SUCCINYLCHOLINE CHLORIDE 200 MG/10ML IV SOSY
PREFILLED_SYRINGE | INTRAVENOUS | Status: DC | PRN
  Administered 2023-06-05: 140 mg via INTRAVENOUS

## 2023-06-05 MED ORDER — CHLORHEXIDINE GLUCONATE 0.12 % MT SOLN
15.0000 mL | Freq: Once | OROMUCOSAL | Status: AC
  Administered 2023-06-05: 15 mL via OROMUCOSAL

## 2023-06-05 MED ORDER — LIDOCAINE HCL (CARDIAC) PF 100 MG/5ML IV SOSY
PREFILLED_SYRINGE | INTRAVENOUS | Status: DC | PRN
  Administered 2023-06-05: 100 mg via INTRAVENOUS

## 2023-06-05 MED ORDER — SIMVASTATIN 20 MG PO TABS: 20.0000 mg | ORAL_TABLET | Freq: Every evening | ORAL | Status: DC

## 2023-06-05 MED ORDER — ACETAMINOPHEN 10 MG/ML IV SOLN
INTRAVENOUS | Status: AC
  Administered 2023-06-05: 1000 mg via INTRAVENOUS
  Filled 2023-06-05: qty 100

## 2023-06-05 MED ORDER — FLUCONAZOLE 100 MG PO TABS
200.0000 mg | ORAL_TABLET | Freq: Every day | ORAL | Status: DC
  Administered 2023-06-06: 200 mg via ORAL
  Filled 2023-06-05: qty 2

## 2023-06-05 MED ORDER — OXYCODONE HCL 5 MG PO TABS: 5.0000 mg | ORAL_TABLET | ORAL | Status: DC | PRN

## 2023-06-05 MED ORDER — IOHEXOL 300 MG/ML  SOLN
INTRAMUSCULAR | Status: DC | PRN
  Administered 2023-06-05: 7 mL via URETHRAL

## 2023-06-05 MED ORDER — DEXTROSE-SODIUM CHLORIDE 5-0.45 % IV SOLN: INTRAVENOUS | Status: AC

## 2023-06-05 MED ORDER — ALBUTEROL SULFATE HFA 108 (90 BASE) MCG/ACT IN AERS: 2.0000 | INHALATION_SPRAY | RESPIRATORY_TRACT | Status: DC | PRN

## 2023-06-05 MED ORDER — PROPOFOL 500 MG/50ML IV EMUL
INTRAVENOUS | Status: AC
Start: 2023-06-05 — End: ?
  Filled 2023-06-05: qty 50

## 2023-06-05 MED ORDER — PROPOFOL 10 MG/ML IV BOLUS
INTRAVENOUS | Status: AC
  Filled 2023-06-05: qty 20

## 2023-06-05 MED ORDER — METOCLOPRAMIDE HCL 5 MG/ML IJ SOLN
INTRAMUSCULAR | Status: AC
  Filled 2023-06-05: qty 2

## 2023-06-05 MED ORDER — LACTATED RINGERS IV SOLN
INTRAVENOUS | Status: DC
Start: 2023-06-05 — End: 2023-06-05

## 2023-06-05 MED ORDER — ONDANSETRON HCL 4 MG/2ML IJ SOLN: 4.0000 mg | INTRAMUSCULAR | Status: DC | PRN

## 2023-06-05 MED ORDER — SUCCINYLCHOLINE CHLORIDE 200 MG/10ML IV SOSY
PREFILLED_SYRINGE | INTRAVENOUS | Status: AC
  Filled 2023-06-05: qty 30

## 2023-06-05 MED ORDER — METOCLOPRAMIDE HCL 5 MG/ML IJ SOLN
INTRAMUSCULAR | Status: DC | PRN
Start: 2023-06-05 — End: 2023-06-05
  Administered 2023-06-05: 10 mg via INTRAVENOUS

## 2023-06-05 MED ORDER — LIDOCAINE HCL (PF) 2 % IJ SOLN
INTRAMUSCULAR | Status: AC
Start: 2023-06-05 — End: ?
  Filled 2023-06-05: qty 20

## 2023-06-05 MED ORDER — LORATADINE 10 MG PO TABS
10.0000 mg | ORAL_TABLET | Freq: Every day | ORAL | Status: DC
  Administered 2023-06-06 – 2023-06-08 (×3): 10 mg via ORAL
  Filled 2023-06-05 (×3): qty 1

## 2023-06-05 MED ORDER — SENNA 8.6 MG PO TABS
1.0000 | ORAL_TABLET | Freq: Two times a day (BID) | ORAL | Status: DC
  Administered 2023-06-05 – 2023-06-07 (×4): 8.6 mg via ORAL
  Filled 2023-06-05 (×5): qty 1

## 2023-06-05 MED ORDER — ASPIRIN 81 MG PO TBEC
81.0000 mg | DELAYED_RELEASE_TABLET | Freq: Two times a day (BID) | ORAL | Status: DC
  Administered 2023-06-05 – 2023-06-08 (×6): 81 mg via ORAL
  Filled 2023-06-05 (×6): qty 1

## 2023-06-05 MED ORDER — PROPOFOL 1000 MG/100ML IV EMUL
INTRAVENOUS | Status: AC
Start: 2023-06-05 — End: ?
  Filled 2023-06-05: qty 100

## 2023-06-05 MED ORDER — DIPHENHYDRAMINE HCL 50 MG/ML IJ SOLN: 12.5000 mg | Freq: Four times a day (QID) | INTRAMUSCULAR | Status: DC | PRN

## 2023-06-05 MED ORDER — TRIPLE ANTIBIOTIC 3.5-400-5000 EX OINT: 1.0000 | TOPICAL_OINTMENT | Freq: Three times a day (TID) | CUTANEOUS | Status: DC | PRN

## 2023-06-05 MED ORDER — ONDANSETRON HCL 4 MG/2ML IJ SOLN
INTRAMUSCULAR | Status: DC | PRN
  Administered 2023-06-05: 4 mg via INTRAVENOUS

## 2023-06-05 MED ORDER — DEXAMETHASONE SODIUM PHOSPHATE 10 MG/ML IJ SOLN
INTRAMUSCULAR | Status: DC | PRN
  Administered 2023-06-05: 5 mg via INTRAVENOUS

## 2023-06-05 MED ORDER — FENTANYL CITRATE (PF) 100 MCG/2ML IJ SOLN
INTRAMUSCULAR | Status: DC | PRN
  Administered 2023-06-05: 100 ug via INTRAVENOUS
  Administered 2023-06-05: 50 ug via INTRAVENOUS

## 2023-06-05 MED ORDER — FENTANYL CITRATE PF 50 MCG/ML IJ SOSY: 25.0000 ug | PREFILLED_SYRINGE | INTRAMUSCULAR | Status: DC | PRN

## 2023-06-05 MED ORDER — TIZANIDINE HCL 4 MG PO TABS: 2.0000 mg | ORAL_TABLET | Freq: Four times a day (QID) | ORAL | Status: DC | PRN

## 2023-06-05 MED ORDER — DOCUSATE SODIUM 100 MG PO CAPS
100.0000 mg | ORAL_CAPSULE | Freq: Two times a day (BID) | ORAL | Status: DC
  Administered 2023-06-05 – 2023-06-07 (×4): 100 mg via ORAL
  Filled 2023-06-05 (×5): qty 1

## 2023-06-05 MED ORDER — FINASTERIDE 5 MG PO TABS
5.0000 mg | ORAL_TABLET | Freq: Every day | ORAL | Status: DC
  Administered 2023-06-06 – 2023-06-08 (×3): 5 mg via ORAL
  Filled 2023-06-05 (×3): qty 1

## 2023-06-05 MED ORDER — SODIUM CHLORIDE 0.9 % IV SOLN
2.0000 g | INTRAVENOUS | Status: AC
  Administered 2023-06-05: 2 g via INTRAVENOUS
  Filled 2023-06-05: qty 20

## 2023-06-05 MED ORDER — ACETAMINOPHEN 325 MG PO TABS
650.0000 mg | ORAL_TABLET | ORAL | Status: DC | PRN
Start: 2023-06-05 — End: 2023-06-08
  Administered 2023-06-06 (×2): 650 mg via ORAL
  Filled 2023-06-05 (×2): qty 2

## 2023-06-05 MED ORDER — PROPOFOL 10 MG/ML IV BOLUS
INTRAVENOUS | Status: DC | PRN
  Administered 2023-06-05: 200 mg via INTRAVENOUS

## 2023-06-05 MED ORDER — OXYCODONE HCL 5 MG/5ML PO SOLN: 5.0000 mg | Freq: Once | ORAL | Status: DC | PRN

## 2023-06-05 MED ORDER — OXYCODONE HCL 5 MG PO TABS: 5.0000 mg | ORAL_TABLET | Freq: Once | ORAL | Status: DC | PRN

## 2023-06-05 MED ORDER — MIDODRINE HCL 5 MG PO TABS
2.5000 mg | ORAL_TABLET | Freq: Three times a day (TID) | ORAL | Status: DC
  Administered 2023-06-06 – 2023-06-08 (×7): 2.5 mg via ORAL
  Filled 2023-06-05 (×8): qty 1

## 2023-06-05 MED ORDER — ALBUTEROL SULFATE (2.5 MG/3ML) 0.083% IN NEBU: 2.5000 mg | INHALATION_SOLUTION | RESPIRATORY_TRACT | Status: DC | PRN

## 2023-06-05 MED ORDER — DEXAMETHASONE SODIUM PHOSPHATE 10 MG/ML IJ SOLN
INTRAMUSCULAR | Status: AC
Start: 2023-06-05 — End: ?
  Filled 2023-06-05: qty 1

## 2023-06-05 MED ORDER — NAPHAZOLINE-PHENIRAMINE 0.025-0.3 % OP SOLN: 1.0000 [drp] | Freq: Four times a day (QID) | OPHTHALMIC | Status: DC | PRN

## 2023-06-05 MED ORDER — DIPHENHYDRAMINE HCL 12.5 MG/5ML PO ELIX: 12.5000 mg | ORAL_SOLUTION | Freq: Four times a day (QID) | ORAL | Status: DC | PRN

## 2023-06-05 MED ORDER — PANTOPRAZOLE SODIUM 40 MG PO TBEC
40.0000 mg | DELAYED_RELEASE_TABLET | Freq: Every day | ORAL | Status: DC
  Administered 2023-06-06 – 2023-06-08 (×3): 40 mg via ORAL
  Filled 2023-06-05 (×3): qty 1

## 2023-06-05 MED ORDER — TAMSULOSIN HCL 0.4 MG PO CAPS
0.4000 mg | ORAL_CAPSULE | Freq: Every day | ORAL | Status: DC
  Administered 2023-06-06 – 2023-06-08 (×3): 0.4 mg via ORAL
  Filled 2023-06-05 (×3): qty 1

## 2023-06-05 MED ORDER — ONDANSETRON HCL 4 MG/2ML IJ SOLN: 4.0000 mg | Freq: Once | INTRAMUSCULAR | Status: DC | PRN

## 2023-06-05 SURGICAL SUPPLY — 14 items
BAG URO CATCHER STRL LF (MISCELLANEOUS) ×2 IMPLANT
CATH URETL OPEN END 6FR 70 (CATHETERS) ×2 IMPLANT
CLOTH BEACON ORANGE TIMEOUT ST (SAFETY) ×2 IMPLANT
GLOVE BIO SURGEON STRL SZ7.5 (GLOVE) ×2 IMPLANT
GOWN STRL REUS W/ TWL XL LVL3 (GOWN DISPOSABLE) ×2 IMPLANT
GOWN STRL REUS W/TWL XL LVL3 (GOWN DISPOSABLE) ×2
GUIDEWIRE STR DUAL SENSOR (WIRE) ×2 IMPLANT
KIT TURNOVER KIT A (KITS) IMPLANT
MANIFOLD NEPTUNE II (INSTRUMENTS) ×2 IMPLANT
PACK CYSTO (CUSTOM PROCEDURE TRAY) ×2 IMPLANT
PAD PREP 24X48 CUFFED NSTRL (MISCELLANEOUS) ×2 IMPLANT
STENT URET 6FRX26 CONTOUR (STENTS) IMPLANT
TUBING CONNECTING 10 (TUBING) ×2 IMPLANT
TUBING UROLOGY SET (TUBING) IMPLANT

## 2023-06-05 NOTE — H&P (Signed)
CC/HPI: Patient underwent right ESWL yesterday. Today, he developed a fever. After was 102 at home. He is afebrile here, blood pressure 118/77     ALLERGIES: Avelox TABS Ciprofloxacin Lipitor TABS Moxifloxacin Penicillin Rivaroxaban    MEDICATIONS: Finasteride 5 mg tablet 1 tablet PO Daily  Percocet 5 mg-325 mg tablet 1 tablet PO Q 4 H PRN  Silodosin 8 mg capsule TAKE ONE CAPSULE BY MOUTH ONE TIME DAILY  Advair Diskus 100 mcg-50 mcg/dose blister, with inhalation device Inhalation  Aspirin Ec 81 mg tablet, delayed release Oral  Budesonide Ec 3 mg capsule, delayed, and extended release Oral  Caltrate 600 600 mg calcium (1,500 mg) tablet Oral  Cephalexin 500 mg capsule 1 capsule PO Daily  Claritin 10 MG Oral Tablet Oral  Coenzyme Q10 200 mg capsule Oral  Folic Acid 1 mg tablet Oral  Methotrexate 25 mg/ml vial Injection  Montelukast Sodium 10 mg tablet Oral  Multi-Day tablet Oral  Nasonex 50 MCG/ACT Nasal Suspension Nasal  Ondansetron Hcl 4 mg tablet 1 tablet PO Daily  Oxycodone Hcl 5 mg tablet 1 tablet PO Daily  Oxycodone-Acetaminophen 5 mg-325 mg tablet Oral  Pantoprazole Sodium 40 mg tablet, delayed release Oral  Prednisone 50 mg tablet Oral  Protonix  Simvastatin 20 mg tablet Oral  Vitamin B-12 100 mcg tablet Oral     GU PSH: No GU PSH      PSH Notes: Rotator Cuff Repair, Hernia Repair   NON-GU PSH: Hernia Repair - 2013 Total Knee Replacement, Bilateral Visit Complexity (formerly GPC1X) - 05/31/2023, 05/23/2023     GU PMH: BPH w/LUTS - 05/31/2023, - 05/23/2023, - 11/22/2022, - 05/22/2022, - 2023, - 2023, - 07/25/2021, Benign prostatic hyperplasia with urinary obstruction, - 2016 Incomplete bladder emptying - 05/31/2023, - 05/23/2023, - 11/22/2022 Ureteral calculus - 05/31/2023 Dysuria - 05/23/2023, - 05/22/2022 Urinary Retention - 09/12/2022, - 09/05/2022 Nocturia - 05/22/2022, - 2023, - 2023, - 07/25/2021, Nocturia, - 2014 Renal calculus - 2023, - 07/25/2021, Renal  calculus, bilateral, - 2017 BPH w/o LUTS, He has BPH by exam. He does not have any significant voiding symptoms at this time. He does take tamsulosin 0.4 mg. - 2019 Encounter for Prostate Cancer screening (Stable), His prostate was noted to be smooth and benign although somewhat enlarged. His PSA at 1.36 remains normal. - 2019, (Stable), His prostate is slightly enlarged but it is smooth and symmetric with no nodularity or induration. In addition his PSA remains low at 1.56. I will continue yearly DRE and PSA., - 2018, Prostate cancer screening, - 2017      PMH Notes: LUTS: He has a history of urinary frequency with associated nocturia x2 and a decreased force of urinary stream. He has experienced initial dysuria at times. His symptoms are improved with tamsulosin which he takes BID.  Current therapy: Tamsulosin 0.8 mg    Calculus disease: CT 02/05/14 - Rt. renal calculi and a 5.54mm left ureteral stone (~900 HU) which he passed.     NON-GU PMH: Pyuria/other UA findings - 11/22/2022 Encounter for general adult medical examination without abnormal findings, Encounter for preventive health examination - 2016 Personal history of other diseases of the digestive system, History of hiatal hernia - 2016, History of ulcerative colitis, - 2014 Asthma, Asthma - 2014 Lymphedema, not elsewhere classified, Lymphedema - 2014 Personal history of other diseases of the nervous system and sense organs, History of glaucoma - 2014, History of sleep apnea, - 2014 Personal history of other endocrine, nutritional and metabolic disease, History of  hypercholesterolemia - 2014    FAMILY HISTORY: Death In The Family Father - Runs In Family Death In The Family Mother - Runs In Family Heart Attack - Mother No pertinent family history - Other   SOCIAL HISTORY: Marital Status: Married Preferred Language: English; Ethnicity: Not Hispanic Or Latino; Race: White Current Smoking Status: Patient does not smoke anymore. Has not  smoked since 10/02/1972.   Tobacco Use Assessment Completed: Used Tobacco in last 30 days? Does not drink anymore.  Drinks 2 caffeinated drinks per day.     Notes: Former smoker, Tobacco use, Occupation: Retired, Alcohol Use, Caffeine Use, Marital History - Currently Married   REVIEW OF SYSTEMS:    GU Review Male:   Patient denies frequent urination, hard to postpone urination, burning/ pain with urination, get up at night to urinate, leakage of urine, stream starts and stops, trouble starting your stream, have to strain to urinate , erection problems, and penile pain.  Gastrointestinal (Upper):   Patient denies nausea, indigestion/ heartburn, and vomiting.  Gastrointestinal (Lower):   Patient denies diarrhea and constipation.  Constitutional:   Patient denies fever, night sweats, weight loss, and fatigue.  Skin:   Patient denies skin rash/ lesion and itching.  Eyes:   Patient denies blurred vision and double vision.  Ears/ Nose/ Throat:   Patient denies sore throat and sinus problems.  Hematologic/Lymphatic:   Patient denies swollen glands and easy bruising.  Cardiovascular:   Patient denies leg swelling and chest pains.  Respiratory:   Patient denies cough and shortness of breath.  Endocrine:   Patient denies excessive thirst.  Musculoskeletal:   Patient denies back pain and joint pain.  Neurological:   Patient denies headaches and dizziness.  Psychologic:   Patient denies depression and anxiety.   VITAL SIGNS:      06/05/2023 04:00 PM  BP 118/77 mmHg  Temperature 98.6 F / 37 C   MULTI-SYSTEM PHYSICAL EXAMINATION:    Constitutional: Well-nourished. No physical deformities. Normally developed. Good grooming. Shivering  Skin: No paleness, no jaundice, no cyanosis. No lesion, no ulcer, no rash.  Gastrointestinal: No mass, no tenderness, no rigidity, non obese abdomen. No CVA tenderness  Eyes: Normal conjunctivae. Normal eyelids.  Musculoskeletal: Normal gait and station of head and  neck.     Complexity of Data:  Source Of History:  Patient  Records Review:   Previous Doctor Records, Previous Patient Records  Urine Test Review:   Urinalysis  X-Ray Review: KUB: Reviewed Films. Discussed With Patient.     09/18/17 09/19/16 09/29/15 09/02/14 04/09/13 04/09/12  PSA  Total PSA 1.36 ng/mL 1.56 ng/dl 4.13  2.44  0.10  2.72     PROCEDURES:         KUB - 74018  A single view of the abdomen is obtained.      Patient confirmed No Neulasta OnPro Device.            Renal Ultrasound - 53664  Right Kidney: Length: 11.5 cm Depth: 8.5 cm Cortical Width: 1.3 cm Width: 6.0 cm  Left Kidney: Length: 12.5 cm Depth: 8.8 cm Cortical Width: 1.1 cm Width: 5.8 cm  Left Kidney/Ureter:  Multiple known parapelvic cysts.  Right Kidney/Ureter:  Hydro persists.  Bladder:  PVR 96.22 ml      Patient confirmed No Neulasta OnPro Device.           Urinalysis w/Scope Dipstick Dipstick Cont'd Micro  Color: Amber Bilirubin: Neg mg/dL WBC/hpf: >40/HKV  Appearance: Turbid Ketones: Neg mg/dL RBC/hpf:  3 - 10/hpf  Specific Gravity: 1.030 Blood: 2+ ery/uL Bacteria: Many (>50/hpf)  pH: 5.5 Protein: 3+ mg/dL Cystals: NS (Not Seen)  Glucose: Neg mg/dL Urobilinogen: 0.2 mg/dL Casts: Granular    Nitrites: Neg Trichomonas: Not Present    Leukocyte Esterase: 3+ leu/uL Mucous: Not Present      Epithelial Cells: NS (Not Seen)      Yeast: Many (>10/hpf)      Sperm: Not Present    Notes: **HYphae present**    ASSESSMENT:      ICD-10 Details  1 GU:   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 Will plan to admit him to the hospital and  proceed with urgent right ureteral stent placement.   CC: Dr. Cardell Peach  Dr. Cyndia Bent        Next Appointment:      Next Appointment: 06/25/2023 09:00 AM    Appointment Type: KUB    Location: [facility] [phone]    Provider: KUB KUB    Reason for Visit: KUB      Signed by Modena Slater, III, M.D. on 06/05/23 at 5:00 PM (EDT)

## 2023-06-05 NOTE — Anesthesia Postprocedure Evaluation (Signed)
Anesthesia Post Note  Patient: [name]  Procedure(s) Performed: CYSTOSCOPY WITH STENT PLACEMENT (Right)     Patient location during evaluation: PACU Anesthesia Type: General Level of consciousness: awake Pain management: pain level controlled Vital Signs Assessment: post-procedure vital signs reviewed and stable Respiratory status: spontaneous breathing Cardiovascular status: stable Postop Assessment: no apparent nausea or vomiting Anesthetic complications: no  No notable events documented.  Last Vitals:  Vitals:   06/05/23 2002 06/05/23 2015  BP: 122/77 127/68  Pulse: 98 92  Resp: 18 18  Temp:    SpO2: 95% 96%    Last Pain:  Vitals:   06/05/23 2015  TempSrc:   PainSc: 0-No pain                 Caren Macadam

## 2023-06-05 NOTE — Transfer of Care (Signed)
Immediate Anesthesia Transfer of Care Note  Patient: [name]  Procedure(s) Performed: CYSTOSCOPY WITH STENT PLACEMENT (Right)  Patient Location: PACU  Anesthesia Type:General  Level of Consciousness: awake, alert , and oriented  Airway & Oxygen Therapy: Patient Spontanous Breathing and Patient connected to nasal cannula oxygen  Post-op Assessment: Report given to RN and Post -op Vital signs reviewed and stable  Post vital signs: Reviewed and stable  Last Vitals:  Vitals Value Taken Time  BP    Temp    Pulse    Resp    SpO2      Last Pain:  Vitals:   06/05/23 1823  TempSrc: Oral  PainSc: 0-No pain         Complications: No notable events documented.

## 2023-06-05 NOTE — Discharge Instructions (Signed)

## 2023-06-05 NOTE — Anesthesia Preprocedure Evaluation (Signed)
Anesthesia Evaluation  Patient identified by MRN, date of birth, ID band Patient awake    Reviewed: Allergy & Precautions, NPO status , Patient's Chart, lab work & pertinent test results  Airway Mallampati: II       Dental no notable dental hx. (+) Caps, Dental Advisory Given   Pulmonary asthma , COPD,  COPD inhaler, former smoker   Pulmonary exam normal        Cardiovascular + Peripheral Vascular Disease  Normal cardiovascular exam     Neuro/Psych  PSYCHIATRIC DISORDERS  Depression     Neuromuscular disease    GI/Hepatic Neg liver ROS, hiatal hernia,GERD  Medicated,,Ulcerative colitis   Endo/Other  Hyperlipidemia  Renal/GU Renal disease   BPH negative genitourinary   Musculoskeletal  (+) Arthritis , Osteoarthritis,  OA right knee   Abdominal Normal abdominal exam  (+)   Peds  Hematology negative hematology ROS (+)   Anesthesia Other Findings   Reproductive/Obstetrics                             Anesthesia Physical Anesthesia Plan  ASA: 2  Anesthesia Plan: General   Post-op Pain Management:    Induction: Intravenous, Cricoid pressure planned and Rapid sequence  PONV Risk Score and Plan: 2 and Ondansetron and Treatment may vary due to age or medical condition  Airway Management Planned: Oral ETT  Additional Equipment: None  Intra-op Plan:   Post-operative Plan: Extubation in OR  Informed Consent: I have reviewed the patients History and Physical, chart, labs and discussed the procedure including the risks, benefits and alternatives for the proposed anesthesia with the patient or authorized representative who has indicated his/her understanding and acceptance.     Dental advisory given  Plan Discussed with: CRNA  Anesthesia Plan Comments:         Anesthesia Quick Evaluation

## 2023-06-05 NOTE — Op Note (Signed)
Operative Note  Preoperative diagnosis:  1.  Right ureteral calculus with concern for UTI sepsis  Post operative diagnosis: 1.  Right ureteral calculus with concern for UTI sepsis  Procedure(s): 1.  Cystoscopy with right retrograde pyelogram and right ureteral stent placement  Surgeon: Modena Slater, MD  Assistants: None  Anesthesia: General  Complications: None immediate  EBL: Minimal  Specimens: 1.  Urine culture from right renal pelvis  Drains/Catheters: 1.  6 X 26 double-J ureteral stent 2.  Foley catheter  Intraoperative findings: 1.  Normal urethra and bladder 2.  Right retrograde pyelogram revealed hydroureteronephrosis  Indication: 82 year old male status post ESWL.  Developed a fever today and presented to clinic where he had persistent right ureteral stone and hydronephrosis.  Presents for urgent ureteral stent placement.  Description of procedure:  The patient was identified and consent was obtained.  The patient was taken to the operating room and placed in the supine position.  The patient was placed under general anesthesia.  Perioperative antibiotics were administered.  The patient was placed in dorsal lithotomy.  Patient was prepped and draped in a standard sterile fashion and a timeout was performed.  A 21 French rigid cystoscope was advanced into the urethra and into the bladder.  A wire was advanced up the right ureter and into the kidney.  I passed an open-ended ureteral catheter over the wire up to the renal pelvis.  Wire was withdrawn.  Hydronephrotic drip was obtained/collected for urine culture.  Retrograde pyelogram was performed with the findings noted above.  A sensor wire was then advanced up to the kidney under fluoroscopic guidance.  A 6 X 26 double-J ureteral stent was advanced up to the kidney under fluoroscopic guidance.  The wire was withdrawn and fluoroscopy confirmed good proximal placement and direct visualization confirmed a good coil within the  bladder.  The bladder was drained and the scope withdrawn.  Foley catheter was placed.  This concluded the operation.  Patient tolerated procedure well and was stable postoperatively.  Plan: Admit to the hospital.  Continue ceftriaxone.  Follow-up cultures.

## 2023-06-05 NOTE — Anesthesia Procedure Notes (Signed)
Procedure Name: Intubation Date/Time: 06/05/2023 7:22 PM  Performed by: Nathen May, CRNAPre-anesthesia Checklist: Patient identified, Emergency Drugs available, Suction available and Patient being monitored Patient Re-evaluated:Patient Re-evaluated prior to induction Oxygen Delivery Method: Circle System Utilized Preoxygenation: Pre-oxygenation with 100% oxygen Induction Type: IV induction Ventilation: Mask ventilation without difficulty Laryngoscope Size: Mac and 3 Tube type: Oral Tube size: 7.5 mm Number of attempts: 1 Airway Equipment and Method: Stylet and Oral airway Placement Confirmation: ETT inserted through vocal cords under direct vision, positive ETCO2 and breath sounds checked- equal and bilateral Secured at: 23 cm Tube secured with: Tape Dental Injury: Teeth and Oropharynx as per pre-operative assessment

## 2023-06-06 ENCOUNTER — Encounter (HOSPITAL_COMMUNITY): Payer: Self-pay | Admitting: Urology

## 2023-06-06 LAB — URINE CULTURE: Culture: 60000 — AB

## 2023-06-06 LAB — CBC
HCT: 36.6 % — ABNORMAL LOW (ref 39.0–52.0)
Hemoglobin: 12 g/dL — ABNORMAL LOW (ref 13.0–17.0)
MCH: 29.2 pg (ref 26.0–34.0)
MCHC: 32.8 g/dL (ref 30.0–36.0)
MCV: 89.1 fL (ref 80.0–100.0)
Platelets: 260 10*3/uL (ref 150–400)
RBC: 4.11 MIL/uL — ABNORMAL LOW (ref 4.22–5.81)
RDW: 14.3 % (ref 11.5–15.5)
WBC: 9.3 10*3/uL (ref 4.0–10.5)
nRBC: 0 % (ref 0.0–0.2)

## 2023-06-06 LAB — BASIC METABOLIC PANEL
Anion gap: 10 (ref 5–15)
BUN: 29 mg/dL — ABNORMAL HIGH (ref 8–23)
CO2: 22 mmol/L (ref 22–32)
Calcium: 8.6 mg/dL — ABNORMAL LOW (ref 8.9–10.3)
Chloride: 102 mmol/L (ref 98–111)
Creatinine, Ser: 1.68 mg/dL — ABNORMAL HIGH (ref 0.61–1.24)
GFR, Estimated: 40 mL/min — ABNORMAL LOW (ref >60)
Glucose, Bld: 216 mg/dL — ABNORMAL HIGH (ref 70–99)
Potassium: 4.2 mmol/L (ref 3.5–5.1)
Sodium: 134 mmol/L — ABNORMAL LOW (ref 135–145)

## 2023-06-06 MED ORDER — CHLORHEXIDINE GLUCONATE CLOTH 2 % EX PADS
6.0000 | MEDICATED_PAD | Freq: Every day | CUTANEOUS | Status: DC
  Administered 2023-06-06 – 2023-06-07 (×2): 6 via TOPICAL

## 2023-06-06 NOTE — TOC CM/SW Note (Signed)
Transition of Care [facility]) - Inpatient Brief Assessment   Patient Details  Name: [name] MRN: [id number] Date of Birth: [birth year]  [facility]) CM/SW Contact:    Howell Rucks, RN Phone Number: 06/06/2023, 11:27 AM   Clinical Narrative: Met with pt at bedside to introduce role of TOC/NCM and review for dc planning. Pt reports he has an established PCP and pharmacy, no current home care services or home DME. Pt reports he resides alone, he is a widower, support from 3 children. Pt reports he has transportation available at discharge. TOC Brief Assessment completed. No TOC needs identified at this time.     Transition of Care Asessment: Insurance and Status: Insurance coverage has been reviewed Patient has primary care physician: Yes Home environment has been reviewed: resides alone in private residence with support from his 3 children, widower Prior level of function:: Independent Prior/Current Home Services: No current home services Social Determinants of Health Reivew: SDOH reviewed no interventions necessary Readmission risk has been reviewed: Yes Transition of care needs: no transition of care needs at this time

## 2023-06-06 NOTE — Progress Notes (Signed)
Pharmacy: Drug Interaction  Drug Interaction with simvastatin & fluconazole discussed w/ Dr Alvester Morin.    Plan: DC simvastatin while on fluconazole. Resume once fluconazole course completed  Herby Abraham, Pharm.D Use secure chat for questions 06/06/2023 9:26 AM

## 2023-06-06 NOTE — Progress Notes (Signed)
   06/06/23 0956  Assess: MEWS Score  Temp (!) 103.1 F (39.5 C)  BP 93/65  MAP (mmHg) 71  Pulse Rate 99  Resp (!) 22  SpO2 95 %  O2 Device Room Air  Assess: MEWS Score  MEWS Temp 2  MEWS Systolic 1  MEWS Pulse 0  MEWS RR 1  MEWS LOC 0  MEWS Score 4  MEWS Score Color Red  Assess: if the MEWS score is Yellow or Red  Were vital signs accurate and taken at a resting state? Yes  Does the patient meet 2 or more of the SIRS criteria? Yes  Does the patient have a confirmed or suspected source of infection? Yes  MEWS guidelines implemented  Yes, red  Treat  MEWS Interventions Considered administering scheduled or prn medications/treatments as ordered  Take Vital Signs  Increase Vital Sign Frequency  Red: Q1hr x2, continue Q4hrs until patient remains green for 12hrs  Escalate  MEWS: Escalate Red: Discuss with charge nurse and notify provider. Consider notifying RRT. If remains red for 2 hours consider need for higher level of care  Notify: Charge Nurse/RN  Name of Charge Nurse/RN Notified Estanislado Pandy RN  Provider Notification  Provider Name/Title Alvester Morin, MD  Date Provider Notified 06/06/23  Time Provider Notified 1010  Method of Notification Page  Notification Reason Change in status  Provider response Other (Comment) (awaiting new orders)  Date of Provider Response 06/06/23  Time of Provider Response 1011  Assess: SIRS CRITERIA  SIRS Temperature  1  SIRS Pulse 1  SIRS Respirations  1  SIRS WBC 0  SIRS Score Sum  3   MD notified. PRN Tylenol administered. RED MEWS guidelines implemented. Continue with plan of care.

## 2023-06-06 NOTE — Plan of Care (Signed)
  Problem: Safety: Goal: Ability to remain free from injury will improve Outcome: Progressing   Problem: Skin Integrity: Goal: Risk for impaired skin integrity will decrease Outcome: Progressing   

## 2023-06-06 NOTE — Progress Notes (Signed)
1 Day Post-Op Subjective: Pain controlled. Fever curve downtrending. Some chills this AM. Tolerating foley with light pink hematuria.  Objective: Vital signs in last 24 hours: Temp:  [97.5 F (36.4 C)-102.9 F (39.4 C)] 100.3 F (37.9 C) (10/30 0831) Pulse Rate:  [61-98] 88 (10/30 0831) Resp:  [15-20] 19 (10/30 0831) BP: (99-137)/(64-83) 126/71 (10/30 0831) SpO2:  [93 %-97 %] 97 % (10/30 0831) Weight:  [75.2 kg] 75.2 kg (10/29 1823)  Intake/Output from previous day: 10/29 0701 - 10/30 0700 In: 968.6 [I.V.:868.6; IV Piggyback:100] Out: 965 [Urine:965] Intake/Output this shift: Total I/O In: -  Out: 250 [Urine:250]  Physical Exam:  General: Alert and oriented CV: RRR Lungs: Clear Abdomen: Soft, ND, NT Ext: NT, No erythema  Lab Results: Recent Labs    06/06/23 0416  HGB 12.0*  HCT 36.6*   BMET Recent Labs    06/06/23 0416  NA 134*  K 4.2  CL 102  CO2 22  GLUCOSE 216*  BUN 29*  CREATININE 1.68*  CALCIUM 8.6*     Studies/Results: DG C-Arm 1-60 Min-No Report  Result Date: 06/05/2023 Fluoroscopy was utilized by the requesting physician.  No radiographic interpretation.    Assessment/Plan: Right ureteral stone s/p ESWL on 10/28 with post-op sepsis s/p R stent by Dr. Alvester Morin on 06/05/2023  -F/u right renal pelvis urine culture.  -Keep in house today on IV abx -Will send home with PO abx once cultures result and fevers resolved -Keep foley today, likely void trial tomorrow AM -Will arrange outpatient f/u and scheduled R URS/LL outpatient.   LOS: 1 day   Matt R. Sahvannah Rieser MD 06/06/2023, 9:20 AM [facility]  Pager: [phone]

## 2023-06-07 ENCOUNTER — Inpatient Hospital Stay (HOSPITAL_COMMUNITY): Payer: Medicare Other

## 2023-06-07 DIAGNOSIS — R7881 Bacteremia: Secondary | ICD-10-CM | POA: Diagnosis not present

## 2023-06-07 DIAGNOSIS — B49 Unspecified mycosis: Secondary | ICD-10-CM

## 2023-06-07 LAB — BLOOD CULTURE ID PANEL (REFLEXED) - BCID2

## 2023-06-07 LAB — BASIC METABOLIC PANEL
Anion gap: 9 (ref 5–15)
BUN: 31 mg/dL — ABNORMAL HIGH (ref 8–23)
CO2: 23 mmol/L (ref 22–32)
Calcium: 8.7 mg/dL — ABNORMAL LOW (ref 8.9–10.3)
Chloride: 103 mmol/L (ref 98–111)
Creatinine, Ser: 1.45 mg/dL — ABNORMAL HIGH (ref 0.61–1.24)
GFR, Estimated: 48 mL/min — ABNORMAL LOW (ref >60)
Glucose, Bld: 130 mg/dL — ABNORMAL HIGH (ref 70–99)
Potassium: 4.2 mmol/L (ref 3.5–5.1)
Sodium: 135 mmol/L (ref 135–145)

## 2023-06-07 LAB — ECHOCARDIOGRAM COMPLETE
AR max vel: 2.5 cm2
AV Peak grad: 9.9 mm[Hg]
Ao pk vel: 1.57 m/s
Area-P 1/2: 3.89 cm2
Height: 70 in
MV M vel: 4.64 m/s
MV Peak grad: 86.1 mm[Hg]
S' Lateral: 3 cm
Weight: 2652.57 [oz_av]

## 2023-06-07 LAB — CBC
HCT: 33.5 % — ABNORMAL LOW (ref 39.0–52.0)
Hemoglobin: 11.1 g/dL — ABNORMAL LOW (ref 13.0–17.0)
MCH: 29.3 pg (ref 26.0–34.0)
MCHC: 33.1 g/dL (ref 30.0–36.0)
MCV: 88.4 fL (ref 80.0–100.0)
Platelets: 260 10*3/uL (ref 150–400)
RBC: 3.79 MIL/uL — ABNORMAL LOW (ref 4.22–5.81)
RDW: 15 % (ref 11.5–15.5)
WBC: 8.5 10*3/uL (ref 4.0–10.5)
nRBC: 0 % (ref 0.0–0.2)

## 2023-06-07 MED ORDER — FLUCONAZOLE 100 MG PO TABS
400.0000 mg | ORAL_TABLET | Freq: Every day | ORAL | Status: AC
  Administered 2023-06-07: 400 mg via ORAL
  Filled 2023-06-07: qty 4

## 2023-06-07 MED ORDER — FLUCONAZOLE IN SODIUM CHLORIDE 400-0.9 MG/200ML-% IV SOLN: 400.0000 mg | Freq: Once | INTRAVENOUS | Status: DC

## 2023-06-07 MED ORDER — FLUCONAZOLE IN SODIUM CHLORIDE 400-0.9 MG/200ML-% IV SOLN
400.0000 mg | Freq: Once | INTRAVENOUS | Status: DC
  Filled 2023-06-07: qty 200

## 2023-06-07 MED ORDER — FLUCONAZOLE 100 MG PO TABS
200.0000 mg | ORAL_TABLET | Freq: Every day | ORAL | Status: DC
  Administered 2023-06-08: 200 mg via ORAL
  Filled 2023-06-07 (×2): qty 2

## 2023-06-07 MED ORDER — FLUCONAZOLE IN SODIUM CHLORIDE 400-0.9 MG/200ML-% IV SOLN: 400.0000 mg | INTRAVENOUS | Status: DC

## 2023-06-07 MED ORDER — FLUCONAZOLE IN SODIUM CHLORIDE 200-0.9 MG/100ML-% IV SOLN: 200.0000 mg | INTRAVENOUS | Status: DC

## 2023-06-07 MED ORDER — FLUCONAZOLE IN SODIUM CHLORIDE 400-0.9 MG/200ML-% IV SOLN
400.0000 mg | Freq: Once | INTRAVENOUS | Status: DC
  Administered 2023-06-07: 400 mg via INTRAVENOUS
  Filled 2023-06-07: qty 200

## 2023-06-07 NOTE — Progress Notes (Signed)
  Echocardiogram 2D Echocardiogram has been performed.  [name] 06/07/2023, 3:36 PM

## 2023-06-07 NOTE — Progress Notes (Signed)
PHARMACY - PHYSICIAN COMMUNICATION CRITICAL VALUE ALERT - BLOOD CULTURE IDENTIFICATION (BCID)  [name] is an 82 y.o. male who presented to [facility] on 06/05/2023 with a chief complaint of fever after ESWL the day before.  Assessment:  2/4 bottles aerobic bottle from each set = candida albicans. Source = urine  Name of physician (or Provider) Contacted: Dr Langston Masker via secure chat  Current antibiotics: ceftriaxone 2 gm q24, fluconazole 200 mg po qday, gent 370 x 1 dose 10/28 SCr up to 1.68, CrCl ~ 35 ml/min  Changes to prescribed antibiotics recommended:  DC Ceftriaxone Fluconazole 800 mg IV x 1 now then fluconazole 200 mg IV q24 ID will be automatically consulted for fungemia  Results for orders placed or performed during the hospital encounter of 06/05/23  Blood Culture ID Panel (Reflexed) (Collected: 06/05/2023  9:56 PM)  Result Value Ref Range   Enterococcus faecalis NOT DETECTED NOT DETECTED   Enterococcus Faecium NOT DETECTED NOT DETECTED   Listeria monocytogenes NOT DETECTED NOT DETECTED   Staphylococcus species NOT DETECTED NOT DETECTED   Staphylococcus aureus (BCID) NOT DETECTED NOT DETECTED   Staphylococcus epidermidis NOT DETECTED NOT DETECTED   Staphylococcus lugdunensis NOT DETECTED NOT DETECTED   Streptococcus species NOT DETECTED NOT DETECTED   Streptococcus agalactiae NOT DETECTED NOT DETECTED   Streptococcus pneumoniae NOT DETECTED NOT DETECTED   Streptococcus pyogenes NOT DETECTED NOT DETECTED   A.calcoaceticus-baumannii NOT DETECTED NOT DETECTED   Bacteroides fragilis NOT DETECTED NOT DETECTED   Enterobacterales NOT DETECTED NOT DETECTED   Enterobacter cloacae complex NOT DETECTED NOT DETECTED   Escherichia coli NOT DETECTED NOT DETECTED   Klebsiella aerogenes NOT DETECTED NOT DETECTED   Klebsiella oxytoca NOT DETECTED NOT DETECTED   Klebsiella pneumoniae NOT DETECTED NOT DETECTED   Proteus species NOT DETECTED NOT DETECTED   Salmonella species  NOT DETECTED NOT DETECTED   Serratia marcescens NOT DETECTED NOT DETECTED   Haemophilus influenzae NOT DETECTED NOT DETECTED   Neisseria meningitidis NOT DETECTED NOT DETECTED   Pseudomonas aeruginosa NOT DETECTED NOT DETECTED   Stenotrophomonas maltophilia NOT DETECTED NOT DETECTED   Candida albicans DETECTED (A) NOT DETECTED   Candida auris NOT DETECTED NOT DETECTED   Candida glabrata NOT DETECTED NOT DETECTED   Candida krusei NOT DETECTED NOT DETECTED   Candida parapsilosis NOT DETECTED NOT DETECTED   Candida tropicalis NOT DETECTED NOT DETECTED   Cryptococcus neoformans/gattii NOT DETECTED NOT DETECTED    Herby Abraham, Pharm.D Use secure chat for questions 06/07/2023 7:26 AM

## 2023-06-07 NOTE — Consult Note (Signed)
[facility]    Date of Admission:  06/05/2023     Reason for Consult: candida albican fungemia    Referring Provider: Kathlene November     Lines:  Peripheral iv's  Abx: 10/31-c fluconazole        Assessment: 82 yo male with recent right kidney stone s/p ESWL on 10/28 but developed sepsis within 24 hours admitted for same, found to have fungemia  A right ureteral stent was placed 10/29 this admission  10/29 bcx grew candida albican. Source likely urinary  No visual complaint  Plan: Fluconazole Echo Repeat bcx  Outpatient ophthalmologic or optometric exam r/o uveitis/retinitis If repeat bcx and echo unremarkable will plan to do 2 weeks antifungal tx ID clinic f/u with me 12/2 @ 11am. This will review visual changes and see if he has had eye exam and evaluate for cure Discussed with primary team     ------------------------------------------------ Principal Problem:   UTI (urinary tract infection)    HPI: FRANKO ATHAS is a 82 y.o. male  with UC, copd, gerd, recent right kidney stone s/p ESWL on 10/28 but developed sepsis within 24 hours admitted for same, found to have fungemia  Patient seen in ed 10/22 for right sided cva tenderness, abd pain, nausea/vomiting, without sign of sepsis. Found to have right ureteral stone 7.5 mm proximal right ureteral and other nonobstructing intrarenal stones. Patient had received cipro short course just prior for sx of several days. He was given cephalexin from the ed  He f/u with urology on 10/28 to undergo eswl. Post procedure after discharge develop fever so returned and admitted 10/29. Renal u/s showed hydronephrosis on the right so received urgent right ureteral stent same day  Due to sepsis, started on ceftriaxone initially  Bcx returned with c albican on bcid so fluconazole started 10/30   Feeling a lot better today  Denies visual blurriness/black spot/eye pain Denies  focal joint/back pain  Has elevated cr but improving No leukocytosis    Family History  Problem Relation Age of Onset   Heart disease Mother        MI   Hyperlipidemia Mother    Other Mother        varicose veins   Heart attack Mother    COPD Sister    Hyperlipidemia Father    Hypertension Father     Social History   Tobacco Use   Smoking status: Former    Current packs/day: 0.00    Average packs/day: 2.0 packs/day for 15.0 years (30.0 ttl pk-yrs)    Types: Cigarettes    Start date: 08/07/1956    Quit date: 08/08/1971    Years since quitting: 51.8   Smokeless tobacco: Former    Types: Chew    Quit date: 08/08/1971  Vaping Use   Vaping status: Never Used  Substance Use Topics   Alcohol use: No   Drug use: Never    Allergies  Allergen Reactions   Lipitor [Atorvastatin Calcium] Other (See Comments)    Muscle ache   Penicillins Other (See Comments)    Patient has never had reaction, but Dr Melba Coon told him not to take due to possible cross-sensitivity with mold allergy Has patient had a PCN reaction causing immediate rash, facial/tongue/throat swelling, SOB or lightheadedness with hypotension: No Has patient had a PCN reaction causing severe rash involving mucus membranes or skin necrosis: No Has patient had a PCN reaction that required  hospitalization: No Has patient had a PCN reaction occurring within the last 10 years: No If all of the above    Ciprofloxacin Diarrhea   Molds & Smuts Rash   Moxifloxacin Rash    Review of Systems: ROS All Other ROS was negative, except mentioned above   Past Medical History:  Diagnosis Date   Arthritis    "joints" (05/09/2018)   Asthma    Chronic bronchitis    COPD (chronic obstructive pulmonary disease) (HCC)    Depression    GERD (gastroesophageal reflux disease)    Hearing loss in left ear    High cholesterol    History of hiatal hernia    Lymphedema of left leg    "foot" (05/09/2018)   Mycobacterium avium complex  (HCC)    Peripheral edema    Pneumonia 04/2018   pt. denies   Sinusitis    Ulcerative colitis        Scheduled Meds:  aspirin EC  81 mg Oral BID   Chlorhexidine Gluconate Cloth  6 each Topical Daily   docusate sodium  100 mg Oral BID   finasteride  5 mg Oral Daily   loratadine  10 mg Oral Daily   midodrine  2.5 mg Oral TID WC   mometasone-formoterol  2 puff Inhalation BID   montelukast  10 mg Oral QHS   pantoprazole  40 mg Oral Daily   senna  1 tablet Oral BID   tamsulosin  0.4 mg Oral Daily   Continuous Infusions:  [START ON 06/08/2023] fluconazole (DIFLUCAN) IV     fluconazole (DIFLUCAN) IV     Followed by   fluconazole (DIFLUCAN) IV     PRN Meds:.acetaminophen, albuterol, diphenhydrAMINE **OR** diphenhydrAMINE, fluticasone, morphine injection, naphazoline-pheniramine, neomycin-bacitracin-polymyxin, ondansetron, oxyCODONE, tiZANidine, zolpidem   OBJECTIVE: Blood pressure 108/67, pulse 72, temperature 98.6 F (37 C), temperature source Oral, resp. rate 20, height 5\' 10"  (1.778 m), weight 75.2 kg, SpO2 96%.  Physical Exam  General/constitutional: no distress, pleasant HEENT: Normocephalic, PER, Conj Clear, EOMI, Oropharynx clear Neck supple CV: rrr no mrg Lungs: clear to auscultation, normal respiratory effort Abd: Soft, Nontender Ext: no edema Skin: No Rash Neuro: nonfocal MSK: no peripheral joint swelling/tenderness/warmth; back spines nontender     Lab Results Lab Results  Component Value Date   WBC 8.5 06/07/2023   HGB 11.1 (L) 06/07/2023   HCT 33.5 (L) 06/07/2023   MCV 88.4 06/07/2023   PLT 260 06/07/2023    Lab Results  Component Value Date   CREATININE 1.45 (H) 06/07/2023   BUN 31 (H) 06/07/2023   NA 135 06/07/2023   K 4.2 06/07/2023   CL 103 06/07/2023   CO2 23 06/07/2023    Lab Results  Component Value Date   ALT 23 05/29/2023   AST 20 05/29/2023   ALKPHOS 66 05/29/2023   BILITOT 0.6 05/29/2023      Microbiology: Recent Results  (from the past 240 hour(s))  Urine Culture     Status: None   Collection Time: 05/29/23  6:40 PM   Specimen: Urine, Clean Catch  Result Value Ref Range Status   Specimen Description   Final    URINE, CLEAN CATCH Performed at [facility], [address] [place], [place] [address]    Special Requests   Final    NONE Performed at [facility], [address] [place], [place] [address]    Culture   Final    NO GROWTH Performed at  [facility], [address], [place], [place] [address]    Report Status 05/31/2023 FINAL  Final  Urine Culture     Status: Abnormal   Collection Time: 06/05/23  7:40 PM   Specimen: Urine, Cystoscope  Result Value Ref Range Status   Specimen Description   Final    CYSTOSCOPY Performed at [facility], [address], [place], [place] [address]    Special Requests   Final    RIGHT Performed at [facility], [address], [place], [place] [address]    Culture 60,000 COLONIES/mL YEAST (A)  Final   Report Status 06/06/2023 FINAL  Final  Culture, blood (Routine X 2) w Reflex to ID Panel     Status: None (Preliminary result)   Collection Time: 06/05/23  9:56 PM   Specimen: BLOOD  Result Value Ref Range Status   Specimen Description   Final    BLOOD BLOOD RIGHT ARM Performed at [facility], [address] [place], [place] [address]    Special Requests   Final    BOTTLES DRAWN AEROBIC AND ANAEROBIC Blood Culture adequate volume Performed at [facility], [address], [place], [place] [address]    Culture  Setup Time   Final    BUDDING YEAST SEEN AEROBIC BOTTLE ONLY CRITICAL RESULT CALLED TO, READ BACK BY AND VERIFIED WITH: PHARMD E. JACKSON 06/07/23 @ 0516 BY AB Performed at [facility], [address], [place], [place] [address]    Culture YEAST  Final   Report Status PENDING  Incomplete  Culture, blood (Routine X 2) w Reflex to  ID Panel     Status: None (Preliminary result)   Collection Time: 06/05/23  9:56 PM   Specimen: BLOOD  Result Value Ref Range Status   Specimen Description   Final    BLOOD BLOOD LEFT HAND Performed at [facility], [address] [place], [place] [address]    Special Requests   Final    BOTTLES DRAWN AEROBIC AND ANAEROBIC Blood Culture adequate volume Performed at [facility], [address], [place], [place] [address]    Culture  Setup Time   Final    BUDDING YEAST SEEN AEROBIC BOTTLE ONLY CRITICAL VALUE NOTED.  VALUE IS CONSISTENT WITH PREVIOUSLY REPORTED AND CALLED VALUE. Performed at [facility], [address] [place], [place] [address]    Culture YEAST  Final   Report Status PENDING  Incomplete  Blood Culture ID Panel (Reflexed)     Status: Abnormal   Collection Time: 06/05/23  9:56 PM  Result Value Ref Range Status   Enterococcus faecalis NOT DETECTED NOT DETECTED Final   Enterococcus Faecium NOT DETECTED NOT DETECTED Final   Listeria monocytogenes NOT DETECTED NOT DETECTED Final   Staphylococcus species NOT DETECTED NOT DETECTED Final   Staphylococcus aureus (BCID) NOT DETECTED NOT DETECTED Final   Staphylococcus epidermidis NOT DETECTED NOT DETECTED Final   Staphylococcus lugdunensis NOT DETECTED NOT DETECTED Final   Streptococcus species NOT DETECTED NOT DETECTED Final   Streptococcus agalactiae NOT DETECTED NOT DETECTED Final   Streptococcus pneumoniae NOT DETECTED NOT DETECTED Final   Streptococcus pyogenes NOT DETECTED NOT DETECTED Final   A.calcoaceticus-baumannii NOT DETECTED NOT DETECTED Final   Bacteroides fragilis NOT DETECTED NOT DETECTED Final   Enterobacterales NOT DETECTED NOT DETECTED Final   Enterobacter cloacae complex NOT DETECTED NOT DETECTED Final   Escherichia coli NOT DETECTED  NOT DETECTED Final   Klebsiella aerogenes NOT DETECTED NOT DETECTED Final   Klebsiella oxytoca NOT DETECTED NOT DETECTED  Final   Klebsiella pneumoniae NOT DETECTED NOT DETECTED Final   Proteus species NOT DETECTED NOT DETECTED Final   Salmonella species NOT DETECTED NOT DETECTED Final   Serratia marcescens NOT DETECTED NOT DETECTED Final   Haemophilus influenzae NOT DETECTED NOT DETECTED Final   Neisseria meningitidis NOT DETECTED NOT DETECTED Final   Pseudomonas aeruginosa NOT DETECTED NOT DETECTED Final   Stenotrophomonas maltophilia NOT DETECTED NOT DETECTED Final   Candida albicans DETECTED (A) NOT DETECTED Final    Comment: CRITICAL RESULT CALLED TO, READ BACK BY AND VERIFIED WITH: PHARMD E. JACKSON 06/07/23 @ 0516 BY AB    Candida auris NOT DETECTED NOT DETECTED Final   Candida glabrata NOT DETECTED NOT DETECTED Final   Candida krusei NOT DETECTED NOT DETECTED Final   Candida parapsilosis NOT DETECTED NOT DETECTED Final   Candida tropicalis NOT DETECTED NOT DETECTED Final   Cryptococcus neoformans/gattii NOT DETECTED NOT DETECTED Final    Comment: Performed at [facility], [address] [place], [place] [address]     Serology:    Imaging: If present, new imagings (plain films, ct scans, and mri) have been personally visualized and interpreted; radiology reports have been reviewed. Decision making incorporated into the Impression / Recommendations.  10/29 u/s reported per urology but I do not see in epic  10/22 abd pelv ct with contrast 1. 7.5 mm stone in the proximal right ureter at the level of L4 with moderate proximal obstruction. 2. Additional nonobstructing intrarenal stone on the right. 3. Left parapelvic cysts.  No imaging follow-up is indicated. 4. Consolidation and volume loss in the left lower lung, likely pneumonia. 5. Large esophageal hiatal hernia containing most of the stomach. 6. Aortic atherosclerosis. 7. Trabeculation and cellule formation of the bladder wall likely indicating chronic outlet obstruction.   Raymondo Band, MD [facility] [phone] pager    06/07/2023, 9:23 AM

## 2023-06-07 NOTE — Progress Notes (Signed)
2 Days Post-Op Subjective: Pain controlled. Fever curve downtrended. Tolerating foley.  Objective: Vital signs in last 24 hours: Temp:  [97.8 F (36.6 C)-99.7 F (37.6 C)] 98.6 F (37 C) (10/31 0902) Pulse Rate:  [57-72] 72 (10/31 0902) Resp:  [15-20] 20 (10/31 0902) BP: (93-109)/(62-82) 108/67 (10/31 0902) SpO2:  [93 %-97 %] 96 % (10/31 0902)  Intake/Output from previous day: 10/30 0701 - 10/31 0700 In: 1146.1 [P.O.:480; I.V.:566.1; IV Piggyback:100] Out: 1725 [Urine:1725] Intake/Output this shift: Total I/O In: 220 [P.O.:120; IV Piggyback:100] Out: 400 [Urine:400]  Physical Exam:  General: Alert and oriented CV: RRR Lungs: Clear Abdomen: Soft, ND, NT Ext: NT, No erythema  Lab Results: Recent Labs    06/06/23 0416 06/07/23 0829  HGB 12.0* 11.1*  HCT 36.6* 33.5*   BMET Recent Labs    06/06/23 0416 06/07/23 0829  NA 134* 135  K 4.2 4.2  CL 102 103  CO2 22 23  GLUCOSE 216* 130*  BUN 29* 31*  CREATININE 1.68* 1.45*  CALCIUM 8.6* 8.7*     Studies/Results: DG C-Arm 1-60 Min-No Report  Result Date: 06/05/2023 Fluoroscopy was utilized by the requesting physician.  No radiographic interpretation.    Assessment/Plan: 1. Right ureteral stone s/p ESWL on 10/28 with post-op sepsis s/p R stent by Dr. Alvester Morin on 06/05/2023 2. Candidemia  -Renal pelvis culture 10/29 grew candida albican -Appreciate ID evaluation.  -Repeat blood cultures ordered earlier today -Will need echo and outpatient eye exam -If repeat blood culture and echo unremarkable, will plan for 2 weeks of antifungal treatment -Void trial    LOS: 2 days   Matt R. Severino Paolo MD 06/07/2023, 1:40 PM [facility]  Pager: [phone]

## 2023-06-07 NOTE — Progress Notes (Signed)
Pharmacy Note- Penicillin Allergy Clarification   ASSESSMENT:  PEN-FAST Scoring   Five years or less since last reaction 0  Anaphylaxis/Angioedema OR Severe cutaneous adverse reaction  0  Treatment required for reaction  0  Total Score 0 points - Very low risk of positive penicillin allergy test (<1%)      According to allergy documentation: "Patient has never had reaction, but Dr Melba Coon told him not to take due to possible cross-sensitivity with mold allergy". Spoke with patient today, and he states he has never had a reaction to penicillin to his knowledge. Patient has tolerated amoxicillin-clavulanate (Augmentin) in the past with no reaction.   Impact on therapy (select all that apply):  Penicillin allergy removed  [name], PharmD Candidate [employer] Class of [phone]

## 2023-06-08 ENCOUNTER — Encounter (HOSPITAL_COMMUNITY): Payer: Self-pay | Admitting: Urology

## 2023-06-08 LAB — BASIC METABOLIC PANEL
Anion gap: 10 (ref 5–15)
BUN: 30 mg/dL — ABNORMAL HIGH (ref 8–23)
CO2: 22 mmol/L (ref 22–32)
Calcium: 8.7 mg/dL — ABNORMAL LOW (ref 8.9–10.3)
Chloride: 100 mmol/L (ref 98–111)
Creatinine, Ser: 1.26 mg/dL — ABNORMAL HIGH (ref 0.61–1.24)
GFR, Estimated: 57 mL/min — ABNORMAL LOW (ref >60)
Glucose, Bld: 102 mg/dL — ABNORMAL HIGH (ref 70–99)
Potassium: 3.6 mmol/L (ref 3.5–5.1)
Sodium: 132 mmol/L — ABNORMAL LOW (ref 135–145)

## 2023-06-08 LAB — CBC WITH DIFFERENTIAL/PLATELET
Abs Immature Granulocytes: 0.08 10*3/uL — ABNORMAL HIGH (ref 0.00–0.07)
Basophils Absolute: 0.1 10*3/uL (ref 0.0–0.1)
Basophils Relative: 1 %
Eosinophils Absolute: 0.5 10*3/uL (ref 0.0–0.5)
Eosinophils Relative: 5 %
HCT: 33.9 % — ABNORMAL LOW (ref 39.0–52.0)
Hemoglobin: 11.4 g/dL — ABNORMAL LOW (ref 13.0–17.0)
Immature Granulocytes: 1 %
Lymphocytes Relative: 15 %
Lymphs Abs: 1.3 10*3/uL (ref 0.7–4.0)
MCH: 28.8 pg (ref 26.0–34.0)
MCHC: 33.6 g/dL (ref 30.0–36.0)
MCV: 85.6 fL (ref 80.0–100.0)
Monocytes Absolute: 0.8 10*3/uL (ref 0.1–1.0)
Monocytes Relative: 9 %
Neutro Abs: 6.1 10*3/uL (ref 1.7–7.7)
Neutrophils Relative %: 69 %
Platelets: 319 10*3/uL (ref 150–400)
RBC: 3.96 MIL/uL — ABNORMAL LOW (ref 4.22–5.81)
RDW: 15 % (ref 11.5–15.5)
WBC Morphology: INCREASED
WBC: 8.8 10*3/uL (ref 4.0–10.5)
nRBC: 0 % (ref 0.0–0.2)

## 2023-06-08 MED ORDER — FLUCONAZOLE 100 MG PO TABS
200.0000 mg | ORAL_TABLET | Freq: Once | ORAL | Status: AC
  Administered 2023-06-08: 200 mg via ORAL
  Filled 2023-06-08: qty 2

## 2023-06-08 MED ORDER — FLUCONAZOLE 100 MG PO TABS: 400.0000 mg | ORAL_TABLET | Freq: Every day | ORAL | Status: DC

## 2023-06-08 MED ORDER — FLUCONAZOLE 100 MG PO TABS: 200.0000 mg | ORAL_TABLET | Freq: Every day | ORAL | 0 refills | Status: DC

## 2023-06-08 MED ORDER — FLUCONAZOLE 100 MG PO TABS
400.0000 mg | ORAL_TABLET | Freq: Every day | ORAL | 0 refills | Status: AC
Start: 2023-06-08 — End: 2023-06-22

## 2023-06-08 NOTE — Care Management Important Message (Signed)
Important Message  Patient Details IM Letter given. Name: [name] MRN: [id number] Date of Birth: Aug 05, 1941   Important Message Given:  Yes - Medicare IM     Caren Macadam 06/08/2023, 9:52 AM

## 2023-06-08 NOTE — Discharge Summary (Signed)
Date of admission: 06/05/2023  Date of discharge: 06/08/2023  Admission diagnosis: Sepsis due to UTI  Discharge diagnosis: Same  Secondary diagnoses: None  History and Physical: For full details, please see admission history and physical. Briefly, [name] is a 82 y.o. year old patient with a history of a right ureteral stone who underwent ESWL on 06/04/2023 however he developed sepsis within 24 hours and underwent right ureteral stent placement by Dr. Alvester Morin on 06/05/2023.Marland Kitchen   Hospital Course: Patient underwent stent placement by Dr. Alvester Morin on 06/05/2023 in his right ureter.  Right renal pelvis urine culture resulted Candida albicans.  He was also found to have candidemia on initial blood cultures.  Most recent blood cultures showed no growth.  He has been treated appropriately with fluconazole.  He has been afebrile and is recovered well.  The patient recovered in the usual expected fashion.  He had his diet advanced slowly.  Initially managed with IV pain control, then transitioned to PO meds when he was tolerating oral intake.  His labs were stable throughout the hospital course.  He was discharged to home on POD#3.  At the time of discharge the patient was tolerating a regular diet, passing flatus, ambulating, had adequate pain control and was agreeable to discharge.  Follow up as scheduled with urology, infectious disease and outpatient for ophthalmologic eye exam. He will continue fluconazole 200mg  daily for 2 weeks per ID pharmacist given his renal function.  Laboratory values:  Recent Labs    06/06/23 0416 06/07/23 0829 06/08/23 0424  HGB 12.0* 11.1* 11.4*  HCT 36.6* 33.5* 33.9*   Recent Labs    06/07/23 0829 06/08/23 0424  CREATININE 1.45* 1.26*    Disposition: Home  Discharge instruction: The patient was instructed to be ambulatory but told to refrain from heavy lifting, strenuous activity, or driving.   Discharge medications:  Allergies as of 06/08/2023        Reactions   Lipitor [atorvastatin Calcium] Other (See Comments)   Muscle ache   Ciprofloxacin Diarrhea   Molds & Smuts Rash   Moxifloxacin Rash        Medication List     TAKE these medications    albuterol 108 (90 Base) MCG/ACT inhaler Commonly known as: VENTOLIN HFA Inhale 2 puffs into the lungs every 4 (four) hours as needed for wheezing or shortness of breath.   aspirin EC 81 MG tablet Take 1 tablet (81 mg total) by mouth 2 (two) times daily.   B-12 SL Place 1 tablet under the tongue daily.   CALCIUM + D PO Take 1 tablet by mouth daily.   cetirizine 10 MG tablet Commonly known as: ZYRTEC Take 10 mg by mouth daily.   COQ-10 PO Take 1 tablet by mouth daily.   diphenoxylate-atropine 2.5-0.025 MG tablet Commonly known as: LOMOTIL Take 1 tablet by mouth 2 (two) times daily as needed (ulcerative colitis flare ups).   finasteride 5 MG tablet Commonly known as: PROSCAR Take 5 mg by mouth daily.   fluconazole 100 MG tablet Commonly known as: Diflucan Take 2 tablets (200 mg total) by mouth daily for 14 days.   fluticasone 50 MCG/ACT nasal spray Commonly known as: FLONASE Place 1 spray into both nostrils daily as needed for allergies or rhinitis.   fluticasone-salmeterol 250-50 MCG/ACT Aepb Commonly known as: ADVAIR Inhale 1 puff into the lungs in the morning and at bedtime.   FOLIC ACID PO Take 1 tablet by mouth daily.   IRON PO Take 1  tablet by mouth daily.   loratadine 10 MG tablet Commonly known as: CLARITIN Take 10 mg by mouth daily.   midodrine 2.5 MG tablet Commonly known as: PROAMATINE Take 2.5 mg by mouth in the morning, at noon, and at bedtime.   montelukast 10 MG tablet Commonly known as: SINGULAIR TAKE 1 TABLET BY MOUTH  EVERY EVENING What changed: when to take this   Mucinex 600 MG 12 hr tablet Generic drug: guaiFENesin Take 600 mg by mouth daily.   ondansetron 4 MG tablet Commonly known as: ZOFRAN Take 1 tablet (4 mg total) by  mouth every 4 (four) hours as needed for nausea or vomiting.   oxyCODONE 5 MG immediate release tablet Commonly known as: Roxicodone Take 1 tablet (5 mg total) by mouth every 4 (four) hours as needed for severe pain (pain score 7-10).   oxycodone-acetaminophen 2.5-325 MG tablet Commonly known as: PERCOCET Take 1 tablet by mouth every 4 (four) hours as needed for pain.   pantoprazole 40 MG tablet Commonly known as: PROTONIX Take 40 mg by mouth daily.   silodosin 8 MG Caps capsule Commonly known as: RAPAFLO Take 8 mg by mouth daily.   simvastatin 20 MG tablet Commonly known as: ZOCOR Take 20 mg by mouth every evening.   tiZANidine 2 MG tablet Commonly known as: ZANAFLEX Take 1 tablet (2 mg total) by mouth every 6 (six) hours as needed for muscle spasms.   TYLENOL 500 MG tablet Generic drug: acetaminophen Take 500 mg by mouth every 8 (eight) hours as needed for moderate pain.   vedolizumab 300 MG injection Commonly known as: ENTYVIO Inject 300 mg into the vein See admin instructions. Every 60 days   Visine-A 0.025-0.3 % ophthalmic solution Generic drug: naphazoline-pheniramine Place 1 drop into both eyes 4 (four) times daily as needed for eye irritation (itchy eye).   VITAMIN D PO Take 1 capsule by mouth daily.        Followup:   Follow-up Information     Jannifer Hick, MD Follow up.   Specialty: Urology Why: Office will call with appointment Contact information: [address] [place] [address] [phone]                 Dineen Kid. Taunja Brickner MD [facility]  Pager: [phone]

## 2023-06-08 NOTE — Progress Notes (Signed)
3 Days Post-Op Subjective: Denies pain. No nausea or emesis. Tolerating diet. Voiding clear yellow urine.  Objective: Vital signs in last 24 hours: Temp:  [98.5 F (36.9 C)-98.8 F (37.1 C)] 98.5 F (36.9 C) (11/01 0534) Pulse Rate:  [69-76] 71 (11/01 0813) Resp:  [18] 18 (11/01 0534) BP: (102-119)/(62-74) 112/73 (11/01 0813) SpO2:  [95 %-96 %] 96 % (11/01 0534)  Intake/Output from previous day: 10/31 0701 - 11/01 0700 In: 820 [P.O.:720; IV Piggyback:100] Out: 2300 [Urine:2300] Intake/Output this shift: Total I/O In: -  Out: 325 [Urine:325] UOP: 2.3L  Physical Exam:  General: Alert and oriented CV: RRR Lungs: Clear Abdomen: Soft, ND, NT Ext: NT, No erythema  Lab Results: Recent Labs    06/06/23 0416 06/07/23 0829 06/08/23 0424  HGB 12.0* 11.1* 11.4*  HCT 36.6* 33.5* 33.9*   BMET Recent Labs    06/07/23 0829 06/08/23 0424  NA 135 132*  K 4.2 3.6  CL 103 100  CO2 23 22  GLUCOSE 130* 102*  BUN 31* 30*  CREATININE 1.45* 1.26*  CALCIUM 8.7* 8.7*     Studies/Results: ECHOCARDIOGRAM COMPLETE  Result Date: 06/07/2023    ECHOCARDIOGRAM REPORT   Patient Name:   [name] Date of Exam: 06/07/2023 Medical Rec #:  [id number]         Height:       70.0 in Accession #:    [id number]        Weight:       165.8 lb Date of Birth:  July 16, 1941          BSA:          1.927 m Patient Age:    82 years          BP:           108/67 mmHg Patient Gender: M                 HR:           70 bpm. Exam Location:  Inpatient Procedure: 2D Echo, Cardiac Doppler, Color Doppler and 3D Echo Indications:    Bacteremia R78.81  History:        Patient has no prior history of Echocardiogram examinations.                 COPD; Risk Factors:Dyslipidemia.  Sonographer:    Lucendia Herrlich RCS Referring Phys: [id number] TRUNG T VU IMPRESSIONS  1. Left ventricular ejection fraction, by estimation, is 60 to 65%. The left ventricle has normal function. The left ventricle has no regional wall motion  abnormalities. Left ventricular diastolic parameters were normal.  2. Right ventricular systolic function is normal. The right ventricular size is normal. There is mildly elevated pulmonary artery systolic pressure.  3. The mitral valve is normal in structure. Trivial mitral valve regurgitation.  4. The aortic valve is tricuspid. Aortic valve regurgitation is not visualized.  5. The inferior vena cava is dilated in size with <50% respiratory variability, suggesting right atrial pressure of 15 mmHg. FINDINGS  Left Ventricle: Left ventricular ejection fraction, by estimation, is 60 to 65%. The left ventricle has normal function. The left ventricle has no regional wall motion abnormalities. The left ventricular internal cavity size was normal in size. There is  no left ventricular hypertrophy. Left ventricular diastolic parameters were normal. Right Ventricle: The right ventricular size is normal. Right ventricular systolic function is normal. There is mildly elevated pulmonary artery systolic pressure. The tricuspid regurgitant velocity is 2.44 m/s,  and with an assumed right atrial pressure of 15 mmHg, the estimated right ventricular systolic pressure is 38.8 mmHg. Left Atrium: Left atrial size was normal in size. Right Atrium: Right atrial size was normal in size. Pericardium: There is no evidence of pericardial effusion. Mitral Valve: The mitral valve is normal in structure. Trivial mitral valve regurgitation. Tricuspid Valve: Tricuspid valve regurgitation is trivial. Aortic Valve: The aortic valve is tricuspid. Aortic valve regurgitation is not visualized. Aortic valve peak gradient measures 9.9 mmHg. Pulmonic Valve: Pulmonic valve regurgitation is not visualized. Aorta: The aortic root and ascending aorta are structurally normal, with no evidence of dilitation. Venous: The inferior vena cava is dilated in size with less than 50% respiratory variability, suggesting right atrial pressure of 15 mmHg. IAS/Shunts: No  atrial level shunt detected by color flow Doppler.  LEFT VENTRICLE PLAX 2D LVIDd:         4.40 cm   Diastology LVIDs:         3.00 cm   LV e' medial:    10.20 cm/s LV PW:         0.80 cm   LV E/e' medial:  9.8 LV IVS:        0.90 cm   LV e' lateral:   12.70 cm/s LVOT diam:     2.10 cm   LV E/e' lateral: 7.9 LV SV:         74 LV SV Index:   39 LVOT Area:     3.46 cm                           3D Volume EF:                          3D EF:        65 %                          LV EDV:       135 ml                          LV ESV:       48 ml                          LV SV:        88 ml RIGHT VENTRICLE             IVC RV S prime:     14.20 cm/s  IVC diam: 2.20 cm TAPSE (M-mode): 1.9 cm LEFT ATRIUM             Index        RIGHT ATRIUM           Index LA diam:        3.30 cm 1.71 cm/m   RA Area:     15.50 cm LA Vol (A2C):   52.8 ml 27.40 ml/m  RA Volume:   40.40 ml  20.96 ml/m LA Vol (A4C):   57.1 ml 29.63 ml/m LA Biplane Vol: 57.4 ml 29.78 ml/m  AORTIC VALVE AV Area (Vmax): 2.50 cm AV Vmax:        157.00 cm/s AV Peak Grad:   9.9 mmHg LVOT Vmax:      113.33 cm/s LVOT Vmean:     74.867 cm/s LVOT  VTI:       0.215 m  AORTA Ao Root diam: 3.40 cm Ao Asc diam:  3.50 cm MITRAL VALVE               TRICUSPID VALVE MV Area (PHT): 3.89 cm    TR Peak grad:   23.8 mmHg MV Decel Time: 195 msec    TR Vmax:        244.00 cm/s MR Peak grad: 86.1 mmHg MR Vmax:      464.00 cm/s  SHUNTS MV E velocity: 99.80 cm/s  Systemic VTI:  0.22 m MV A velocity: 99.00 cm/s  Systemic Diam: 2.10 cm MV E/A ratio:  1.01 Photographer signed by Carolan Clines Signature Date/Time: 06/07/2023/4:43:53 PM    Final     Assessment/Plan: Right ureteral stone s/p ESWL on 10/28 with post-op sepsis s/p R stent by Dr. Alvester Morin on 06/05/2023 Candidemia   -Renal pelvis culture 10/29 grew candida albican -Repeat blood culture no growth -Echo down yesterday with no concerning findings, normal EF, trivial mitral val regurgitation.  -Will discharge home  on 2 week course of diflucan 400mg  daily -F/u with ID outpatient -I arranged f/u with me to ensure urine culture negative and surgery scheduler with post for ureteroscopy after infection treated   LOS: 3 days   Matt R. Searcy Miyoshi MD 06/08/2023, 9:20 AM [facility]  Pager: [phone]

## 2023-06-08 NOTE — Progress Notes (Signed)
Tte negative Renal function improved    Increased fluconazole dose Otherwise dc plan from id standpoint remains same

## 2023-06-08 NOTE — Plan of Care (Signed)
  Problem: Education: Goal: Knowledge of General Education information will improve Description: Including pain rating scale, medication(s)/side effects and non-pharmacologic comfort measures Outcome: Adequate for Discharge   Problem: Health Behavior/Discharge Planning: Goal: Ability to manage health-related needs will improve Outcome: Adequate for Discharge   Problem: Clinical Measurements: Goal: Ability to maintain clinical measurements within normal limits will improve Outcome: Adequate for Discharge Goal: Will remain free from infection Outcome: Adequate for Discharge Goal: Diagnostic test results will improve Outcome: Adequate for Discharge Goal: Respiratory complications will improve Outcome: Adequate for Discharge Goal: Cardiovascular complication will be avoided Outcome: Adequate for Discharge   Problem: Activity: Goal: Risk for activity intolerance will decrease Outcome: Adequate for Discharge   Problem: Nutrition: Goal: Adequate nutrition will be maintained Outcome: Adequate for Discharge   Problem: Coping: Goal: Level of anxiety will decrease Outcome: Adequate for Discharge   Problem: Elimination: Goal: Will not experience complications related to bowel motility Outcome: Adequate for Discharge Goal: Will not experience complications related to urinary retention Outcome: Adequate for Discharge   Problem: Pain Management: Goal: General experience of comfort will improve Outcome: Adequate for Discharge   Problem: Safety: Goal: Ability to remain free from injury will improve Outcome: Adequate for Discharge   Problem: Skin Integrity: Goal: Risk for impaired skin integrity will decrease Outcome: Adequate for Discharge   Problem: Fluid Volume: Goal: Hemodynamic stability will improve Outcome: Adequate for Discharge   Problem: Clinical Measurements: Goal: Diagnostic test results will improve Outcome: Adequate for Discharge Goal: Signs and symptoms of  infection will decrease Outcome: Adequate for Discharge   Problem: Respiratory: Goal: Ability to maintain adequate ventilation will improve Outcome: Adequate for Discharge

## 2023-06-11 LAB — CULTURE, BLOOD (ROUTINE X 2): Special Requests: ADEQUATE

## 2023-06-12 ENCOUNTER — Other Ambulatory Visit: Payer: Self-pay | Admitting: Urology

## 2023-06-12 LAB — CULTURE, BLOOD (ROUTINE X 2)
Culture: NO GROWTH
Culture: NO GROWTH

## 2023-06-12 LAB — MISC LABCORP TEST (SEND OUT)
LabCorp test name: 9
Labcorp test code: 183119

## 2023-06-13 NOTE — Progress Notes (Signed)
Sent message, via epic in basket, requesting orders in epic from surgeon.  

## 2023-06-15 LAB — CULTURE, BLOOD (ROUTINE X 2): Special Requests: ADEQUATE

## 2023-06-18 NOTE — Patient Instructions (Signed)
SURGICAL WAITING ROOM VISITATION  Patients having surgery or a procedure may have no more than 2 support people in the waiting area - these visitors may rotate.    Children under the age of 47 must have an adult with them who is not the patient.  Due to an increase in RSV and influenza rates and associated hospitalizations, children ages 32 and under may not visit patients in [facility] hospitals.  If the patient needs to stay at the hospital during part of their recovery, the visitor guidelines for inpatient rooms apply. Pre-op nurse will coordinate an appropriate time for 1 support person to accompany patient in pre-op.  This support person may not rotate.    Please refer to the [facility] website for the visitor guidelines for Inpatients (after your surgery is over and you are in a regular room).       Your procedure is scheduled on: 06/29/23   Report to [facility] Main Entrance    Report to admitting at 9:45 AM   Call this number if you have problems the morning of surgery [phone]   Do not eat food  or drink liquids :After Midnight.    Oral Hygiene is also important to reduce your risk of infection.                                    Remember - BRUSH YOUR TEETH THE MORNING OF SURGERY WITH YOUR REGULAR TOOTHPASTE  DENTURES WILL BE REMOVED PRIOR TO SURGERY PLEASE DO NOT APPLY "Poly grip" OR ADHESIVES!!!   Stop all vitamins and herbal supplements 7 days before surgery.   Take these medicines the morning of surgery with A SIP OF WATER: Tylenol, cetirizine, Proscar, Claritin, Midodrine, Pantoprazole, Rapaflo, Inhalers, nasal spray             You may not have any metal on your body including hair pins, jewelry, and body piercing             Do not wear make-up, lotions, powders, perfumes/cologne, or deodorant               Men may shave face and neck.   Do not bring valuables to the hospital. [facility] IS NOT             RESPONSIBLE   FOR  VALUABLES.   Contacts, glasses, dentures or bridgework may not be worn into surgery.  DO NOT BRING YOUR HOME MEDICATIONS TO THE HOSPITAL. PHARMACY WILL DISPENSE MEDICATIONS LISTED ON YOUR MEDICATION LIST TO YOU DURING YOUR ADMISSION IN THE HOSPITAL!    Patients discharged on the day of surgery will not be allowed to drive home.  Someone NEEDS to stay with you for the first 24 hours after anesthesia.   Special Instructions: Bring a copy of your healthcare power of attorney and living will documents the day of surgery if you haven't scanned them before.              Please read over the following fact sheets you were given: IF YOU HAVE QUESTIONS ABOUT YOUR PRE-OP INSTRUCTIONS PLEASE CALL [phone] Rosey [facility]   If you received a COVID test during your pre-op visit  it is requested that you wear a mask when out in public, stay away from anyone that may not be feeling well and notify your surgeon if you develop symptoms. If you test positive for Covid or have  been in contact with anyone that has tested positive in the last 10 days please notify you surgeon.    [facility] - Preparing for Surgery Before surgery, you can play an important role.  Because skin is not sterile, your skin needs to be as free of germs as possible.  You can reduce the number of germs on your skin by washing with CHG (chlorahexidine gluconate) soap before surgery.  CHG is an antiseptic cleaner which kills germs and bonds with the skin to continue killing germs even after washing. Please DO NOT use if you have an allergy to CHG or antibacterial soaps.  If your skin becomes reddened/irritated stop using the CHG and inform your nurse when you arrive at Short Stay. Do not shave (including legs and underarms) for at least 48 hours prior to the first CHG shower.  You may shave your face/neck.  Please follow these instructions carefully:  1.  Shower with CHG Soap the night before surgery and the  morning of surgery.  2.  If you  choose to wash your hair, wash your hair first as usual with your normal  shampoo.  3.  After you shampoo, rinse your hair and body thoroughly to remove the shampoo.                             4.  Use CHG as you would any other liquid soap.  You can apply chg directly to the skin and wash.  Gently with a scrungie or clean washcloth.  5.  Apply the CHG Soap to your body ONLY FROM THE NECK DOWN.   Do   not use on face/ open                           Wound or open sores. Avoid contact with eyes, ears mouth and   genitals (private parts).                       Wash face,  Genitals (private parts) with your normal soap.             6.  Wash thoroughly, paying special attention to the area where your    surgery  will be performed.  7.  Thoroughly rinse your body with warm water from the neck down.  8.  DO NOT shower/wash with your normal soap after using and rinsing off the CHG Soap.                9.  Pat yourself dry with a clean towel.            10.  Wear clean pajamas.            11.  Place clean sheets on your bed the night of your first shower and do not  sleep with pets. Day of Surgery : Do not apply any lotions/deodorants the morning of surgery.  Please wear clean clothes to the hospital/surgery center.  FAILURE TO FOLLOW THESE INSTRUCTIONS MAY RESULT IN THE CANCELLATION OF YOUR SURGERY  PATIENT SIGNATURE_________________________________  NURSE SIGNATURE__________________________________  ________________________________________________________________________

## 2023-06-18 NOTE — Progress Notes (Signed)
COVID Vaccine received:  []  No [x]  Yes Date of any COVID positive Test in last 90 days: no PCP - Jasper Loser MD at [facility]. Med. Cardiologist - Dr. Fransisco Beau  Chest x-ray -  EKG -   Stress Test -  ECHO - 06/07/23 Epic Cardiac Cath -   Bowel Prep - [x]  No  []   Yes ______  Pacemaker / ICD device [x]  No []  Yes   Spinal Cord Stimulator:[x]  No []  Yes       History of Sleep Apnea? [x]  No []  Yes   CPAP used?- [x]  No []  Yes    Does the patient monitor blood sugar?          [x]  No []  Yes  []  N/A  Patient has: [x]  NO Hx DM   []  Pre-DM                 []  DM1  []   DM2 Does patient have a [employer] or Dexacom? []  No []  Yes   Fasting Blood Sugar Ranges-  Checks Blood Sugar _____ times a day  GLP1 agonist / usual dose - no GLP1 instructions:  SGLT-2 inhibitors / usual dose - no SGLT-2 instructions:   Blood Thinner / Instructions:no Aspirin Instructions:ASA 81mg   Stopped taking 2 weeks ago.  Comments:   Activity level: Patient is able  to climb a flight of stairs without difficulty; [x]  No CP  [x]  No SOB,  ___   Patient can  perform ADLs without assistance.   Anesthesia review:   Patient denies shortness of breath, fever, cough and chest pain at PAT appointment.  Patient verbalized understanding and agreement to the Pre-Surgical Instructions that were given to them at this PAT appointment. Patient was also educated of the need to review these PAT instructions again prior to his/her surgery.I reviewed the appropriate phone numbers to call if they have any and questions or concerns.

## 2023-06-20 ENCOUNTER — Other Ambulatory Visit: Payer: Self-pay

## 2023-06-20 ENCOUNTER — Encounter (HOSPITAL_COMMUNITY): Payer: Self-pay

## 2023-06-20 ENCOUNTER — Encounter (HOSPITAL_COMMUNITY)
Admission: RE | Admit: 2023-06-20 | Discharge: 2023-06-20 | Disposition: A | Discharge status: Home / Self Care | Payer: Medicare Other | Source: Ambulatory Visit | Attending: Urology | Admitting: Urology

## 2023-06-20 VITALS — BP 124/81 | HR 79 | Temp 97.7°F | Resp 16 | Ht 70.0 in | Wt 155.0 lb

## 2023-06-20 DIAGNOSIS — Z01818 Encounter for other preprocedural examination: Secondary | ICD-10-CM | POA: Diagnosis present

## 2023-06-20 HISTORY — DX: Personal history of urinary calculi: Z87.442

## 2023-06-20 LAB — CBC
HCT: 41.2 % (ref 39.0–52.0)
Hemoglobin: 13 g/dL (ref 13.0–17.0)
MCH: 28.8 pg (ref 26.0–34.0)
MCHC: 31.6 g/dL (ref 30.0–36.0)
MCV: 91.2 fL (ref 80.0–100.0)
Platelets: 482 10*3/uL — ABNORMAL HIGH (ref 150–400)
RBC: 4.52 MIL/uL (ref 4.22–5.81)
RDW: 14.4 % (ref 11.5–15.5)
WBC: 11.2 10*3/uL — ABNORMAL HIGH (ref 4.0–10.5)
nRBC: 0 % (ref 0.0–0.2)

## 2023-06-28 NOTE — Anesthesia Preprocedure Evaluation (Addendum)
Anesthesia Evaluation  Patient identified by MRN, date of birth, ID band Patient awake    Reviewed: Allergy & Precautions, NPO status , Patient's Chart, lab work & pertinent test results  History of Anesthesia Complications Negative for: history of anesthetic complications  Airway Mallampati: II  TM Distance: >3 FB Neck ROM: Full    Dental  (+) Missing,    Pulmonary asthma , COPD,  COPD inhaler, former smoker   Pulmonary exam normal        Cardiovascular negative cardio ROS Normal cardiovascular exam     Neuro/Psych    Depression       GI/Hepatic Neg liver ROS, hiatal hernia, PUD,GERD  Medicated,,Ulcerative colitis   Endo/Other  negative endocrine ROS    Renal/GU RIGHT URETERAL STONE  negative genitourinary   Musculoskeletal  (+) Arthritis ,    Abdominal   Peds  Hematology negative hematology ROS (+)   Anesthesia Other Findings Day of surgery medications reviewed with patient.  Reproductive/Obstetrics                              Anesthesia Physical Anesthesia Plan  ASA: 2  Anesthesia Plan: General   Post-op Pain Management: Tylenol PO (pre-op)*   Induction:   PONV Risk Score and Plan: 2 and Ondansetron and Treatment may vary due to age or medical condition  Airway Management Planned: LMA  Additional Equipment: None  Intra-op Plan:   Post-operative Plan: Extubation in OR  Informed Consent: I have reviewed the patients History and Physical, chart, labs and discussed the procedure including the risks, benefits and alternatives for the proposed anesthesia with the patient or authorized representative who has indicated his/her understanding and acceptance.     Dental advisory given  Plan Discussed with: CRNA  Anesthesia Plan Comments:          Anesthesia Quick Evaluation

## 2023-06-29 ENCOUNTER — Ambulatory Visit (HOSPITAL_COMMUNITY): Payer: Medicare Other

## 2023-06-29 ENCOUNTER — Ambulatory Visit (HOSPITAL_COMMUNITY): Payer: Medicare Other | Admitting: Anesthesiology

## 2023-06-29 ENCOUNTER — Encounter (HOSPITAL_COMMUNITY)
Admission: RE | Disposition: A | Discharge status: Home / Self Care | Payer: Self-pay | Source: Ambulatory Visit | Attending: Urology

## 2023-06-29 ENCOUNTER — Ambulatory Visit (HOSPITAL_COMMUNITY)
Admission: RE | Admit: 2023-06-29 | Discharge: 2023-06-29 | Disposition: A | Discharge status: Home / Self Care | Payer: Medicare Other | Source: Ambulatory Visit | Attending: Urology | Admitting: Urology

## 2023-06-29 ENCOUNTER — Other Ambulatory Visit: Payer: Self-pay

## 2023-06-29 ENCOUNTER — Encounter (HOSPITAL_COMMUNITY): Payer: Self-pay | Admitting: Urology

## 2023-06-29 DIAGNOSIS — Z79631 Long term (current) use of antimetabolite agent: Secondary | ICD-10-CM | POA: Insufficient documentation

## 2023-06-29 DIAGNOSIS — N201 Calculus of ureter: Secondary | ICD-10-CM

## 2023-06-29 DIAGNOSIS — R338 Other retention of urine: Secondary | ICD-10-CM | POA: Insufficient documentation

## 2023-06-29 DIAGNOSIS — F32A Depression, unspecified: Secondary | ICD-10-CM | POA: Insufficient documentation

## 2023-06-29 DIAGNOSIS — N401 Enlarged prostate with lower urinary tract symptoms: Secondary | ICD-10-CM | POA: Diagnosis not present

## 2023-06-29 DIAGNOSIS — Z87891 Personal history of nicotine dependence: Secondary | ICD-10-CM | POA: Diagnosis not present

## 2023-06-29 DIAGNOSIS — Z7951 Long term (current) use of inhaled steroids: Secondary | ICD-10-CM | POA: Diagnosis not present

## 2023-06-29 DIAGNOSIS — K279 Peptic ulcer, site unspecified, unspecified as acute or chronic, without hemorrhage or perforation: Secondary | ICD-10-CM | POA: Diagnosis not present

## 2023-06-29 DIAGNOSIS — K5190 Ulcerative colitis, unspecified, without complications: Secondary | ICD-10-CM | POA: Diagnosis not present

## 2023-06-29 DIAGNOSIS — M1990 Unspecified osteoarthritis, unspecified site: Secondary | ICD-10-CM | POA: Insufficient documentation

## 2023-06-29 DIAGNOSIS — N132 Hydronephrosis with renal and ureteral calculous obstruction: Secondary | ICD-10-CM | POA: Insufficient documentation

## 2023-06-29 DIAGNOSIS — K449 Diaphragmatic hernia without obstruction or gangrene: Secondary | ICD-10-CM | POA: Insufficient documentation

## 2023-06-29 DIAGNOSIS — K219 Gastro-esophageal reflux disease without esophagitis: Secondary | ICD-10-CM | POA: Diagnosis not present

## 2023-06-29 DIAGNOSIS — J449 Chronic obstructive pulmonary disease, unspecified: Secondary | ICD-10-CM | POA: Diagnosis not present

## 2023-06-29 HISTORY — PX: CYSTOSCOPY/URETEROSCOPY/HOLMIUM LASER: SHX6545

## 2023-06-29 SURGERY — CYSTOURETEROSCOPY, USING HOLMIUM LASER
Anesthesia: General | Laterality: Right

## 2023-06-29 MED ORDER — PHENYLEPHRINE 80 MCG/ML (10ML) SYRINGE FOR IV PUSH (FOR BLOOD PRESSURE SUPPORT)
PREFILLED_SYRINGE | INTRAVENOUS | Status: AC
Start: 2023-06-29 — End: ?
  Filled 2023-06-29: qty 10

## 2023-06-29 MED ORDER — SODIUM CHLORIDE 0.9 % IR SOLN
Status: DC | PRN
  Administered 2023-06-29: 3000 mL via INTRAVESICAL

## 2023-06-29 MED ORDER — FENTANYL CITRATE (PF) 100 MCG/2ML IJ SOLN
INTRAMUSCULAR | Status: DC | PRN
  Administered 2023-06-29 (×2): 25 ug via INTRAVENOUS
  Administered 2023-06-29: 50 ug via INTRAVENOUS
  Administered 2023-06-29: 25 ug via INTRAVENOUS

## 2023-06-29 MED ORDER — OXYCODONE HCL 5 MG PO TABS
ORAL_TABLET | ORAL | Status: AC
  Administered 2023-06-29: 5 mg via ORAL
  Filled 2023-06-29: qty 1

## 2023-06-29 MED ORDER — OXYCODONE HCL 5 MG/5ML PO SOLN: 5.0000 mg | Freq: Once | ORAL | Status: AC | PRN

## 2023-06-29 MED ORDER — IOHEXOL 300 MG/ML  SOLN
INTRAMUSCULAR | Status: DC | PRN
  Administered 2023-06-29: 4 mL

## 2023-06-29 MED ORDER — DEXAMETHASONE SODIUM PHOSPHATE 10 MG/ML IJ SOLN
INTRAMUSCULAR | Status: DC | PRN
  Administered 2023-06-29: 5 mg via INTRAVENOUS

## 2023-06-29 MED ORDER — DEXAMETHASONE SODIUM PHOSPHATE 10 MG/ML IJ SOLN
INTRAMUSCULAR | Status: AC
  Filled 2023-06-29: qty 1

## 2023-06-29 MED ORDER — LIDOCAINE HCL (PF) 2 % IJ SOLN
INTRAMUSCULAR | Status: AC
  Filled 2023-06-29: qty 5

## 2023-06-29 MED ORDER — PHENYLEPHRINE 80 MCG/ML (10ML) SYRINGE FOR IV PUSH (FOR BLOOD PRESSURE SUPPORT)
PREFILLED_SYRINGE | INTRAVENOUS | Status: DC | PRN
  Administered 2023-06-29 (×2): 40 ug via INTRAVENOUS

## 2023-06-29 MED ORDER — ONDANSETRON HCL 4 MG/2ML IJ SOLN
INTRAMUSCULAR | Status: DC | PRN
  Administered 2023-06-29: 4 mg via INTRAVENOUS

## 2023-06-29 MED ORDER — FENTANYL CITRATE (PF) 100 MCG/2ML IJ SOLN
INTRAMUSCULAR | Status: AC
  Filled 2023-06-29: qty 2

## 2023-06-29 MED ORDER — ORAL CARE MOUTH RINSE: 15.0000 mL | Freq: Once | OROMUCOSAL | Status: AC

## 2023-06-29 MED ORDER — DROPERIDOL 2.5 MG/ML IJ SOLN: 0.6250 mg | Freq: Once | INTRAMUSCULAR | Status: DC | PRN

## 2023-06-29 MED ORDER — ONDANSETRON HCL 4 MG/2ML IJ SOLN
INTRAMUSCULAR | Status: AC
  Filled 2023-06-29: qty 2

## 2023-06-29 MED ORDER — OXYCODONE-ACETAMINOPHEN 5-325 MG PO TABS
1.0000 | ORAL_TABLET | ORAL | 0 refills | Status: AC | PRN
Start: 2023-06-29 — End: 2024-06-28

## 2023-06-29 MED ORDER — PROPOFOL 10 MG/ML IV BOLUS
INTRAVENOUS | Status: DC | PRN
  Administered 2023-06-29: 50 mg via INTRAVENOUS
  Administered 2023-06-29: 100 mg via INTRAVENOUS
  Administered 2023-06-29: 50 mg via INTRAVENOUS

## 2023-06-29 MED ORDER — OXYCODONE HCL 5 MG PO TABS: 5.0000 mg | ORAL_TABLET | Freq: Once | ORAL | Status: AC | PRN

## 2023-06-29 MED ORDER — CHLORHEXIDINE GLUCONATE 0.12 % MT SOLN
15.0000 mL | Freq: Once | OROMUCOSAL | Status: AC
  Administered 2023-06-29: 15 mL via OROMUCOSAL

## 2023-06-29 MED ORDER — LACTATED RINGERS IV SOLN: INTRAVENOUS | Status: DC

## 2023-06-29 MED ORDER — LIDOCAINE HCL (CARDIAC) PF 100 MG/5ML IV SOSY
PREFILLED_SYRINGE | INTRAVENOUS | Status: DC | PRN
  Administered 2023-06-29: 100 mg via INTRAVENOUS

## 2023-06-29 MED ORDER — PROPOFOL 10 MG/ML IV BOLUS
INTRAVENOUS | Status: AC
  Filled 2023-06-29: qty 20

## 2023-06-29 MED ORDER — GENTAMICIN SULFATE 40 MG/ML IJ SOLN
340.0000 mg | INTRAMUSCULAR | Status: AC
  Administered 2023-06-29: 340 mg via INTRAVENOUS
  Filled 2023-06-29: qty 8.5

## 2023-06-29 MED ORDER — FENTANYL CITRATE PF 50 MCG/ML IJ SOSY: 25.0000 ug | PREFILLED_SYRINGE | INTRAMUSCULAR | Status: DC | PRN

## 2023-06-29 MED ORDER — ACETAMINOPHEN 500 MG PO TABS
1000.0000 mg | ORAL_TABLET | Freq: Once | ORAL | Status: AC
  Administered 2023-06-29: 1000 mg via ORAL
  Filled 2023-06-29: qty 2

## 2023-06-29 SURGICAL SUPPLY — 23 items
BAG URO CATCHER STRL LF (MISCELLANEOUS) ×2 IMPLANT
BASKET ZERO TIP NITINOL 2.4FR (BASKET) IMPLANT
BENZOIN TINCTURE PRP APPL 2/3 (GAUZE/BANDAGES/DRESSINGS) IMPLANT
CATH URETERAL DUAL LUMEN 10F (MISCELLANEOUS) IMPLANT
CATH URETL OPEN 5X70 (CATHETERS) ×2 IMPLANT
CLOTH BEACON ORANGE TIMEOUT ST (SAFETY) ×2 IMPLANT
DRSG TEGADERM 2-3/8X2-3/4 SM (GAUZE/BANDAGES/DRESSINGS) IMPLANT
FIBER LASER MOSES 200 DFL (Laser) IMPLANT
GLOVE BIOGEL M 7.0 STRL (GLOVE) ×2 IMPLANT
GOWN STRL REUS W/ TWL XL LVL3 (GOWN DISPOSABLE) ×2 IMPLANT
GUIDEWIRE STR DUAL SENSOR (WIRE) ×4 IMPLANT
GUIDEWIRE ZIPWRE .038 STRAIGHT (WIRE) IMPLANT
KIT TURNOVER KIT A (KITS) IMPLANT
LASER FIB FLEXIVA PULSE ID 365 (Laser) IMPLANT
MANIFOLD NEPTUNE II (INSTRUMENTS) ×2 IMPLANT
PACK CYSTO (CUSTOM PROCEDURE TRAY) ×2 IMPLANT
PAD PREP 24X48 CUFFED NSTRL (MISCELLANEOUS) ×2 IMPLANT
SHEATH DILATOR SET 8/10 (MISCELLANEOUS) IMPLANT
SHEATH NAVIGATOR HD 12/14X46 (SHEATH) IMPLANT
STENT URET 6FRX26 CONTOUR (STENTS) IMPLANT
TRACTIP FLEXIVA PULS ID 200XHI (Laser) IMPLANT
TUBING CONNECTING 10 (TUBING) ×2 IMPLANT
TUBING UROLOGY SET (TUBING) ×2 IMPLANT

## 2023-06-29 NOTE — Discharge Instructions (Signed)
[facility] [phone] Post Ureteroscopy With or Without Stent Instructions  Definitions:  Ureter: The duct that transports urine from the kidney to the bladder. Stent:   A plastic hollow tube that is placed into the ureter, from the kidney to the bladder to prevent the ureter from swelling shut.  GENERAL INSTRUCTIONS:  Despite the fact that no skin incisions were used, the area around the ureter and bladder is raw and irritated. The stent is a foreign body which will further irritate the bladder wall. This irritation is manifested by increased frequency of urination, both day and night, and by an increase in the urge to urinate. In some, the urge to urinate is present almost always. Sometimes the urge is strong enough that you may not be able to stop yourself from urinating. The only real cure is to remove the stent and then give time for the bladder wall to heal which can't be done until the danger of the ureter swelling shut has passed, which varies.  You may see some blood in your urine while the stent is in place and a few days afterwards. Do not be alarmed, even if the urine was clear for a while. Get off your feet and drink lots of fluids until clearing occurs. If you start to pass clots or don't improve, call us.  DIET: You may return to your normal diet immediately. Because of the raw surface of your bladder, alcohol, spicy foods, acid type foods and drinks with caffeine may cause irritation or frequency and should be used in moderation. To keep your urine flowing freely and to avoid constipation, drink plenty of fluids during the day ( 8-10 glasses ). Tip: Avoid cranberry juice because it is very acidic.  ACTIVITY: Your physical activity doesn't need to be restricted. However, if you are very active, you may see some blood in your urine. We suggest that you reduce your activity under these circumstances until the bleeding has stopped.  BOWELS: It is important to  keep your bowels regular during the postoperative period. Straining with bowel movements can cause bleeding. A bowel movement every other day is reasonable. Use a mild laxative if needed, such as Milk of Magnesia 2-3 tablespoons, or 2 Dulcolax tablets. Call if you continue to have problems. If you have been taking narcotics for pain, before, during or after your surgery, you may be constipated. Take a laxative if necessary.   MEDICATION: You should resume your pre-surgery medications unless told not to. In addition you will often be given an antibiotic to prevent infection. These should be taken as prescribed until the bottles are finished unless you are having an unusual reaction to one of the drugs.  PROBLEMS YOU SHOULD REPORT TO [place]: Fevers over 100.5 Fahrenheit. Heavy bleeding, or clots ( See above notes about blood in urine ). Inability to urinate. Drug reactions ( hives, rash, nausea, vomiting, diarrhea ). Severe burning or pain with urination that is not improving.  FOLLOW-UP: You will need a follow-up appointment to monitor your progress. Call for this appointment at the number listed above. Usually the first appointment will be about three to fourteen days after your surgery.  You have a stent draining your right kidney attached to a string.  You may remove stent by pulling attached string on Monday morning.

## 2023-06-29 NOTE — Anesthesia Procedure Notes (Signed)
Procedure Name: LMA Insertion Date/Time: 06/29/2023 2:03 PM  Performed by: Sampson Goon, CRNAPre-anesthesia Checklist: Patient identified, Emergency Drugs available, Suction available and Patient being monitored Patient Re-evaluated:Patient Re-evaluated prior to induction Oxygen Delivery Method: Circle System Utilized Preoxygenation: Pre-oxygenation with 100% oxygen Induction Type: IV induction LMA: LMA inserted LMA Size: 4.0 Number of attempts: 1 Placement Confirmation: positive ETCO2 Tube secured with: Tape Dental Injury: Teeth and Oropharynx as per pre-operative assessment

## 2023-06-29 NOTE — Anesthesia Postprocedure Evaluation (Signed)
Anesthesia Post Note  Patient: BRECKER FEUERBORN  Procedure(s) Performed: CYSTOSCOPY/RIGHT RETROGRADE PYELOGRAM/RIGHT URETEROSCOPY/HOLMIUM LASER/RIGHT STENT (Right)     Patient location during evaluation: PACU Anesthesia Type: General Level of consciousness: awake and alert Pain management: pain level controlled Vital Signs Assessment: post-procedure vital signs reviewed and stable Respiratory status: spontaneous breathing, nonlabored ventilation and respiratory function stable Cardiovascular status: blood pressure returned to baseline Postop Assessment: no apparent nausea or vomiting Anesthetic complications: no   No notable events documented.  Last Vitals:  Vitals:   06/29/23 1500 06/29/23 1515  BP: 125/83 (!) 115/96  Pulse: 76 75  Resp: 14 18  Temp:  36.5 C  SpO2: 90% 92%    Last Pain:  Vitals:   06/29/23 1515  TempSrc:   PainSc: 2                  Shanda Howells

## 2023-06-29 NOTE — Op Note (Signed)
Operative Note  Preoperative diagnosis:  1.  Right ureteral stone  Postoperative diagnosis: 1.  Right ureteral stone  Procedure(s): 1.  Cystoscopy 2. Right ureteroscopy with laser lithotripsy and basket extraction of stones 3. Right retrograde pyelogram 4. Right ureteral stent exchange 5. Fluoroscopy with intraoperative interpretation  Surgeon: Jettie Pagan, MD  Assistants:  None  Anesthesia:  General  Complications:  None  EBL:  Minimal  Specimens: 1. Stones for stone analysis (to be done at [facility])  Drains/Catheters: 1.  Right 6Fr x 26cm ureteral stent with tether string  Intraoperative findings:   Cystoscopy demonstrated no suspicious bladder lesions. Right ureteroscopy demonstrated impacted right ureteral stone 8 mm, successfully fragmented passed extracted. Successful stent placement.  Indication:  [name] is a 82 y.o. male with history of a right ureteral stone who underwent ESWL.  Unfortunate, this was complicated by postoperative sepsis.  He then underwent stent placement by Dr. Alvester Morin.  He received appropriate biotics and negative preoperative urine culture.  He is seen today for definitive treatment of his right ureteral stone with ureteroscopy and laser lithotripsy.  Description of procedure: After informed consent was obtained from the patient, the patient was identified and taken to the operating room and placed in the supine position.  General anesthesia was administered as well as perioperative IV antibiotics.  At the beginning of the case, a time-out was performed to properly identify the patient, the surgery to be performed, and the surgical site.  Sequential compression devices were applied to the lower extremities at the beginning of the case for DVT prophylaxis.  The patient was then placed in the dorsal lithotomy supine position, prepped and draped in sterile fashion.  We then passed the 21-French rigid cystoscope through the urethra and  into the bladder under vision without any difficulty, noting a normal urethra without strictures and a mildly obstructing prostate.  A systematic evaluation of the bladder revealed no evidence of any suspicious bladder lesions.  Ureteral orifices were in normal position.    The distal aspect of the ureteral stent was seen protruding from the right ureteral orifice.  We then used the alligator-tooth forceps and grasped the distal end of the ureteral stent and brought it out the urethral meatus while watching the proximal coil straighten out nicely on fluoroscopy. Through the ureteral stent, we then passed a 0.038 sensor wire up to the level of the renal pelvis.  The ureteral stent was then removed, leaving the sensor wire up the right ureter.    Under cystoscopic and flouroscopic guidance, we cannulated the right ureteral orifice with a 5-French open-ended ureteral catheter and a gentle retrograde pyelogram was performed, revealing a normal caliber ureter without any filling defects. There was mild hydronephrosis of the collecting system. A 0.038 sensor wire was then passed up to the level of the renal pelvis and secured to the drape as a safety wire. The ureteral catheter and cystoscope were removed, leaving the safety wire in place.   A semi-rigid ureteroscope was passed alongside the wire up the distal ureter which appeared normal.  I encountered an impacted proximal right ureteral stone.  Using 200 m holmium laser fiber, this was fragmented in its entirety and then passed extracted using a 0 tip basket.  I then surveyed the ureter with no fragments remaining.  By the separate 0.038 sensor wire and over this wire, passed flexible cystoscope into the kidney.  I surveyed the kidney.  Removed a 2 mm renal stone.  Right retrograde  pyelogram demonstrated no filling defects no extravasation of contrast and only mild hydronephrosis. We encountered no further stones.   We then withdrew the ureteroscope back down  the ureter along with the access sheath, noting no evidence of any stones along the course of the ureter.  Prior to removing the ureteroscope, we did pass the Glidewire back up to the ureter to the renal pelvis.    Once the ureteroscope was removed, the Glidewire was backloaded through the rigid cystoscope, which was then advanced down the urethra and into the bladder. We then used the Glidewire under direct vision through the rigid cystoscope and under fluoroscopic guidance and passed up a 6-French, 26 cm double-pigtail ureteral stent up ureter, making sure that the proximal and distal ends coiled within the kidney and bladder respectively.  Note that we left a long tether string attached to the distal end of the ureteral stent and it exited the urethral meatus and was secured to the penile shaft with a tegaderm adhesive.  The cystoscope was then advanced back into the bladder under vision.  We were able to see the distal stent coiling nicely within the bladder.  The bladder was then emptied with irrigation solution.  The cystoscope was then removed.    The patient tolerated the procedure well and there was no complication. Patient was awoken from anesthesia and taken to the recovery room in stable condition. I was present and scrubbed for the entirety of the case.  Plan:  Patient will be discharged home.  He will remove his stent by pulling on string in 3 days.   [name] MD [facility]  Pager: [phone]

## 2023-06-29 NOTE — Transfer of Care (Signed)
Immediate Anesthesia Transfer of Care Note  Patient: [name]  Procedure(s) Performed: CYSTOSCOPY/RIGHT RETROGRADE PYELOGRAM/RIGHT URETEROSCOPY/HOLMIUM LASER/RIGHT STENT (Right)  Patient Location: PACU  Anesthesia Type:General  Level of Consciousness: awake  Airway & Oxygen Therapy: Patient Spontanous Breathing  Post-op Assessment: Report given to RN and Post -op Vital signs reviewed and stable  Post vital signs: Reviewed and stable  Last Vitals:  Vitals Value Taken Time  BP 128/101 06/29/23 1446  Temp    Pulse 82 06/29/23 1448  Resp 14 06/29/23 1448  SpO2 92 % 06/29/23 1448  Vitals shown include unfiled device data.  Last Pain:  Vitals:   06/29/23 1003  TempSrc: Oral         Complications: No notable events documented.

## 2023-06-29 NOTE — H&P (Signed)
Office Visit Report     06/20/2023   --------------------------------------------------------------------------------   [name]  MRN: [id number]  DOB: [birth year], 82 year old Male   PRIMARY CARE:  Bayard Beaver, MD   REFERRING:  Latrelle Dodrill. Octavia Heir,   PROVIDER:  Jettie Pagan, M.D.  LOCATION:  [facility] [phone]     --------------------------------------------------------------------------------   CC/HPI: [name] is an 82 year old male who is seen in follow-up with history of BPH/LUTS and recent urolithiasis.   1. BPH/LUTS:  He has a long history of BPH managed with silodosin 8 mg and finasteride 5 mg daily. He states that this is therapy, he is very satisfied with strong flow stream. IPSS score today is 18, quality-of-life 3. PVR today 63 mL. At this time, he is not interested in bladder outlet obstruction procedure.   He has had prior prostate examinations. He has never had a prostate biopsy. He has never had known prostate nodules. Given his age, he elects to forego additional prostate cancer screening.   2. Right ureteral stone:  -He developed right-sided flank pain and presented to the ER on 05/29/2023 were CT scan revealed 7 mm stone in the proximal right ureter at the level of L4 with moderate proximal obstruction. He also additionally has nonobstructing intrarenal small right renal stone.  -KUB 05/31/2023 with evidence of large 7 mm right proximal ureteral stone at the level of L4.  -S/p ESWL on 06/04/2023. Unfortunate, this was complicated by postoperative sepsis and he underwent right ureteral stent placement by Dr. Alvester Morin on 06/05/2023. Renal pelvis culture grew Candida albicans. Initial blood culture was positive for Candida albicans however subsequent blood culture showed no growth. He had echocardiogram with no concerning findings. He was discharged home with 2-week course of Diflucan with plan to follow-up with infectious disease  outpatient as well as with an ophthalmologist to ensure no ophthalmology findings.  -He is currently doing well and denies abdominal pain, flank pain, fevers, chills. Urinalysis is without sign of infection.   He denies history of gross hematuria. He quit smoking 1975. He denies family history of urologic malignancy. He has not had a prior cystoscopy.     ALLERGIES: Avelox TABS Ciprofloxacin Lipitor TABS Moxifloxacin Penicillin Rivaroxaban    MEDICATIONS: Finasteride 5 mg tablet 1 tablet PO Daily  Percocet 5 mg-325 mg tablet 1 tablet PO Q 4 H PRN  Silodosin 8 mg capsule TAKE ONE CAPSULE BY MOUTH ONE TIME DAILY  Advair Diskus 100 mcg-50 mcg/dose blister, with inhalation device Inhalation  Aspirin Ec 81 mg tablet, delayed release Oral  Budesonide Ec 3 mg capsule, delayed, and extended release Oral  Caltrate 600 600 mg calcium (1,500 mg) tablet Oral  Cephalexin 500 mg capsule 1 capsule PO Daily  Claritin 10 MG Oral Tablet Oral  Coenzyme Q10 200 mg capsule Oral  Folic Acid 1 mg tablet Oral  Methotrexate 25 mg/ml vial Injection  Montelukast Sodium 10 mg tablet Oral  Multi-Day tablet Oral  Nasonex 50 MCG/ACT Nasal Suspension Nasal  Ondansetron Hcl 4 mg tablet 1 tablet PO Daily  Oxycodone Hcl 5 mg tablet 1 tablet PO Daily  Oxycodone-Acetaminophen 5 mg-325 mg tablet Oral  Pantoprazole Sodium 40 mg tablet, delayed release Oral  Prednisone 50 mg tablet Oral  Protonix  Simvastatin 20 mg tablet Oral  Vitamin B-12 100 mcg tablet Oral     GU PSH: No GU PSH      PSH Notes: Rotator Cuff Repair, Hernia Repair   NON-GU PSH:  Hernia Repair - 2013 Total Knee Replacement, Bilateral Visit Complexity (formerly GPC1X) - 05/31/2023, 05/23/2023     GU PMH: Acute Cystitis/UTI - 06/05/2023 Ureteral calculus - 06/05/2023, - 05/31/2023 BPH w/LUTS - 05/31/2023, - 05/23/2023, - 11/22/2022, - 05/22/2022, - 2023, - 2023, - 07/25/2021, Benign prostatic hyperplasia with urinary obstruction, -  2016 Incomplete bladder emptying - 05/31/2023, - 05/23/2023, - 11/22/2022 Dysuria - 05/23/2023, - 05/22/2022 Urinary Retention - 09/12/2022, - 09/05/2022 Nocturia - 05/22/2022, - 2023, - 2023, - 07/25/2021, Nocturia, - 2014 Renal calculus - 2023, - 07/25/2021, Renal calculus, bilateral, - 2017 BPH w/o LUTS, He has BPH by exam. He does not have any significant voiding symptoms at this time. He does take tamsulosin 0.4 mg. - 2019 Encounter for Prostate Cancer screening (Stable), His prostate was noted to be smooth and benign although somewhat enlarged. His PSA at 1.36 remains normal. - 2019, (Stable), His prostate is slightly enlarged but it is smooth and symmetric with no nodularity or induration. In addition his PSA remains low at 1.56. I will continue yearly DRE and PSA., - 2018, Prostate cancer screening, - 2017      PMH Notes: LUTS: He has a history of urinary frequency with associated nocturia x2 and a decreased force of urinary stream. He has experienced initial dysuria at times. His symptoms are improved with tamsulosin which he takes BID.  Current therapy: Tamsulosin 0.8 mg    Calculus disease: CT 02/05/14 - Rt. renal calculi and a 5.29mm left ureteral stone (~900 HU) which he passed.     NON-GU PMH: Pyuria/other UA findings - 11/22/2022 Encounter for general adult medical examination without abnormal findings, Encounter for preventive health examination - 2016 Personal history of other diseases of the digestive system, History of hiatal hernia - 2016, History of ulcerative colitis, - 2014 Asthma, Asthma - 2014 Lymphedema, not elsewhere classified, Lymphedema - 2014 Personal history of other diseases of the nervous system and sense organs, History of glaucoma - 2014, History of sleep apnea, - 2014 Personal history of other endocrine, nutritional and metabolic disease, History of hypercholesterolemia - 2014    FAMILY HISTORY: Death In The Family Father - Runs In Family Death In The Family  Mother - Runs In Family Heart Attack - Mother No pertinent family history - Other   SOCIAL HISTORY: Marital Status: Married Preferred Language: English; Ethnicity: Not Hispanic Or Latino; Race: White Current Smoking Status: Patient does not smoke anymore. Has not smoked since 10/02/1972.   Tobacco Use Assessment Completed: Used Tobacco in last 30 days? Does not drink anymore.  Drinks 2 caffeinated drinks per day.     Notes: Former smoker, Tobacco use, Occupation: Retired, Alcohol Use, Caffeine Use, Marital History - Currently Married   REVIEW OF SYSTEMS:    GU Review Male:   Patient reports frequent urination, burning/ pain with urination, and get up at night to urinate. Patient denies hard to postpone urination, leakage of urine, stream starts and stops, trouble starting your stream, have to strain to urinate , erection problems, and penile pain.  Gastrointestinal (Upper):   Patient denies nausea, vomiting, and indigestion/ heartburn.  Gastrointestinal (Lower):   Patient denies constipation and diarrhea.  Constitutional:   Patient denies fever, night sweats, weight loss, and fatigue.  Skin:   Patient denies skin rash/ lesion and itching.  Eyes:   Patient denies blurred vision and double vision.  Ears/ Nose/ Throat:   Patient denies sore throat and sinus problems.  Hematologic/Lymphatic:   Patient denies swollen glands and  easy bruising.  Cardiovascular:   Patient denies leg swelling and chest pains.  Respiratory:   Patient denies cough and shortness of breath.  Endocrine:   Patient denies excessive thirst.  Musculoskeletal:   Patient denies back pain and joint pain.  Neurological:   Patient denies headaches and dizziness.  Psychologic:   Patient denies depression and anxiety.   VITAL SIGNS:      06/20/2023 09:19 AM  BP 132/88 mmHg  Pulse 87 /min  Temperature 98.2 F / 36.7 C   MULTI-SYSTEM PHYSICAL EXAMINATION:    Constitutional: Well-nourished. No physical deformities. Normally  developed. Good grooming.  Respiratory: No labored breathing, no use of accessory muscles.   Cardiovascular: Normal temperature, normal extremity pulses, no swelling, no varicosities.  Gastrointestinal: No mass, no tenderness, no rigidity, non obese abdomen.     Complexity of Data:  Source Of History:  Patient, Medical Record Summary  Records Review:   Previous Doctor Records, Previous Hospital Records, Previous Patient Records  Urine Test Review:   Urinalysis  X-Ray Review: C.T. Abdomen/Pelvis: Reviewed Films. Reviewed Report. Discussed With Patient.     09/18/17 09/19/16 09/29/15 09/02/14 04/09/13 04/09/12  PSA  Total PSA 1.36 ng/mL 1.56 ng/dl 0.86  5.78  4.69  6.29     PROCEDURES:         PVR Ultrasound - 52841  Scanned Volume: 130 cc         Visit Complexity - G2211          Urinalysis w/Scope Dipstick Dipstick Cont'd Micro  Color: Yellow Bilirubin: Neg mg/dL WBC/hpf: 10 - 32/GMW  Appearance: Slightly Cloudy Ketones: Neg mg/dL RBC/hpf: 20 - 10/UVO  Specific Gravity: 1.020 Blood: 3+ ery/uL Bacteria: NS (Not Seen)  pH: 5.5 Protein: 2+ mg/dL Cystals: NS (Not Seen)  Glucose: Neg mg/dL Urobilinogen: 0.2 mg/dL Casts: Hyaline    Nitrites: Neg Trichomonas: Not Present    Leukocyte Esterase: 2+ leu/uL Mucous: Present      Epithelial Cells: 0 - 5/hpf      Yeast: NS (Not Seen)      Sperm: Not Present    ASSESSMENT:      ICD-10 Details  1 GU:   Ureteral calculus - N20.1   2   BPH w/LUTS - N40.1   3   Incomplete bladder emptying - R39.14    PLAN:           Orders Labs Urine Culture          Document Letter(s):  Created for Patient: Clinical Summary         Notes:    #1. BPH is LUTS: Continue silodosin and finasteride.   #2. Right ureteral stone:  7 mm right ureteral stone initially treated with ESWL however developed sepsis underwent right stent placement.  -He is scheduled for right ureteroscopy with laser lithotripsy and basket traction of stone on 06/29/2023.   -He can pleated 2-week outpatient course of Diflucan  -Will obtain preoperative urine culture today and treat if positive.   Ureteroscopy: risks and benefits of ureteroscopy were outlined, including infection, bleeding, pain, temporary ureteral stent and associated stent bother, ureteral injury, ureteral stricture, need for ancillary treatments, and global anesthesia risks including but not limited to CVA, MI, DVT, PE, pneumonia, and death.    CC: Bayard Beaver, MD        Next Appointment:      Next Appointment: 06/29/2023 12:00 PM    Appointment Type: Surgery     Location: [facility] -  [id number]    Provider: Jettie Pagan, M.D.    Reason for Visit: [facility]/OP CYSTO, (R) RPG, (R) URS, HLL, (R) STENT    Urology Preoperative H&P   Chief Complaint: Right ureteral stone  History of Present Illness: [name] is a 82 y.o. male with a right ureteral stone here for right ureteroscopy with laser lithotripsy and basket extraction of stone. Denies fevers, chills or dysuria.    Past Medical History:  Diagnosis Date   Arthritis    "joints" (05/09/2018)   Asthma    Chronic bronchitis    COPD (chronic obstructive pulmonary disease) (HCC)    Depression    GERD (gastroesophageal reflux disease)    Hearing loss in left ear    High cholesterol    History of hiatal hernia    History of kidney stones    Lymphedema of left leg    "foot" (05/09/2018)   Mycobacterium avium complex (HCC)    Peripheral edema    Pneumonia 04/2018   pt. denies   Sinusitis    Ulcerative colitis     Past Surgical History:  Procedure Laterality Date   BIOPSY  03/18/2019   Procedure: BIOPSY;  Surgeon: Bernette Redbird, MD;  Location: [facility];  Service: Endoscopy;;  Egd and Flex   CATARACT EXTRACTION W/ INTRAOCULAR LENS  IMPLANT, BILATERAL Bilateral    COLONOSCOPY W/ BIOPSIES AND POLYPECTOMY     several with polys removed   CYSTOSCOPY WITH STENT PLACEMENT Right 06/05/2023    Procedure: CYSTOSCOPY WITH STENT PLACEMENT;  Surgeon: Crista Elliot, MD;  Location: [facility];  Service: Urology;  Laterality: Right;   ESOPHAGOGASTRODUODENOSCOPY (EGD) WITH PROPOFOL N/A 03/18/2019   Procedure: ESOPHAGOGASTRODUODENOSCOPY (EGD) WITH PROPOFOL;  Surgeon: Bernette Redbird, MD;  Location: [facility];  Service: Endoscopy;  Laterality: N/A;  Will need enteroscope    EXTRACORPOREAL SHOCK WAVE LITHOTRIPSY Right 06/04/2023   Procedure: RIGHT EXTRACORPOREAL SHOCK WAVE LITHOTRIPSY (ESWL);  Surgeon: Heloise Purpura, MD;  Location: [facility];  Service: Urology;  Laterality: Right;  75 MINUTES NEEDED FOR CASE   FLEXIBLE SIGMOIDOSCOPY N/A 03/18/2019   Procedure: FLEXIBLE SIGMOIDOSCOPY;  Surgeon: Bernette Redbird, MD;  Location: [facility];  Service: Endoscopy;  Laterality: N/A;  Unprepped   HERNIA REPAIR  2013   x 3 hernias repaired   JOINT REPLACEMENT     SHOULDER OPEN ROTATOR CUFF REPAIR Bilateral    TOTAL KNEE ARTHROPLASTY Right 08/13/2015   Procedure: RIGHT TOTAL KNEE ARTHROPLASTY;  Surgeon: Ollen Gross, MD;  Location: [facility];  Service: Orthopedics;  Laterality: Right;   TOTAL KNEE ARTHROPLASTY Left 09/04/2022   Procedure: LEFT TOTAL KNEE ARTHROPLASTY;  Surgeon: Gean Birchwood, MD;  Location: [facility];  Service: Orthopedics;  Laterality: Left;    Allergies:  Allergies  Allergen Reactions   Lipitor [Atorvastatin Calcium] Other (See Comments)    Muscle ache   Ciprofloxacin Diarrhea   Molds & Smuts Rash   Moxifloxacin Rash    Family History  Problem Relation Age of Onset   Heart disease Mother        MI   Hyperlipidemia Mother    Other Mother        varicose veins   Heart attack Mother    COPD Sister    Hyperlipidemia Father    Hypertension Father     Social History:  reports that he quit smoking about 51 years ago. His smoking use included cigarettes. He started smoking about 66 years ago. He has a 30 pack-year smoking history.  He has never been exposed to  tobacco smoke. He quit smokeless tobacco use about 51 years ago.  His smokeless tobacco use included chew. He reports that he does not drink alcohol and does not use drugs.  ROS: A complete review of systems was performed.  All systems are negative except for pertinent findings as noted.  Physical Exam:  Vital signs in last 24 hours: Temp:  [98.4 F (36.9 C)] 98.4 F (36.9 C) (11/22 1003) Pulse Rate:  [97] 97 (11/22 1003) Resp:  [18] 18 (11/22 1003) BP: (128)/(84) 128/84 (11/22 1003) SpO2:  [95 %] 95 % (11/22 1003) Weight:  [70.3 kg] 70.3 kg (11/22 1001) Constitutional:  Alert and oriented, No acute distress Cardiovascular: Regular rate and rhythm Respiratory: Normal respiratory effort, Lungs clear bilaterally GI: Abdomen is soft, nontender, nondistended, no abdominal masses GU: No CVA tenderness Lymphatic: No lymphadenopathy Neurologic: Grossly intact, no focal deficits Psychiatric: Normal mood and affect  Laboratory Data:  No results for input(s): "WBC", "HGB", "HCT", "PLT" in the last 72 hours.  No results for input(s): "NA", "K", "CL", "GLUCOSE", "BUN", "CALCIUM", "CREATININE" in the last 72 hours.  Invalid input(s): "CO3"   No results found for this or any previous visit (from the past 24 hour(s)). No results found for this or any previous visit (from the past 240 hour(s)).  Renal Function: No results for input(s): "CREATININE" in the last 168 hours. CrCl cannot be calculated (Patient's most recent lab result is older than the maximum 21 days allowed.).  Radiologic Imaging: No results found.  I independently reviewed the above imaging studies.  Assessment and Plan Hooper LOUISE MCCULLAGH is a 82 y.o. male with right ureteral stone here for right ureteroscopy with laser lithotripsy and basket extraction of stone.  -The risks, benefits and alternatives of cystoscopy with ureteroscopy and  JJ stent placement was discussed with the patient.  Risks include, but are not  limited to: bleeding, urinary tract infection, ureteral injury, ureteral stricture disease, chronic pain, urinary symptoms, bladder injury, stent migration, the need for nephrostomy tube placement, MI, CVA, DVT, PE and the inherent risks with general anesthesia.  The patient voices understanding and wishes to proceed.    Matt R. Chudney Scheffler MD 06/29/2023, 1:32 PM  [facility] Pager: [phone]): [phone]

## 2023-06-30 ENCOUNTER — Encounter (HOSPITAL_COMMUNITY): Payer: Self-pay | Admitting: Urology

## 2023-07-09 ENCOUNTER — Other Ambulatory Visit: Payer: Self-pay

## 2023-07-09 ENCOUNTER — Ambulatory Visit: Payer: Medicare Other | Admitting: Internal Medicine

## 2023-07-09 ENCOUNTER — Encounter: Payer: Self-pay | Admitting: Internal Medicine

## 2023-07-09 VITALS — BP 122/79 | HR 77 | Resp 16 | Ht 70.0 in | Wt 156.0 lb

## 2023-07-09 DIAGNOSIS — B49 Unspecified mycosis: Secondary | ICD-10-CM

## 2023-07-09 NOTE — Patient Instructions (Signed)
For the next 1-2 weeks continue to monitor for fever, chill, new persistent progressive joint pain  If these occur let me know  Otherwise if no sx like that then you are considered cured

## 2023-07-09 NOTE — Progress Notes (Signed)
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) 05/09/2018   Asthma 05/09/2018   Degenerative arthritis of left knee 08/13/2015   Chronic bronchitis with acute exacerbation (HCC) 09/23/2013   Atherosclerosis of native artery of extremity with intermittent claudication (HCC) 06/18/2012   Pain in limb 06/18/2012   Claudication (HCC) 05/30/2012   Frequency of urination 03/13/2012   General medical examination 05/29/2011   Myalgia 04/04/2011   DERMATOPHYTOSIS OF NAIL 09/22/2010   Inguinal hernia 09/22/2010   DECREASED HEARING 12/23/2009   RHINITIS 12/23/2009   UNSPECIFIED DISORDER OF SKIN&SUBCUTANEOUS TISSUE 12/23/2009   Hyperlipidemia 09/13/2009   LYMPHEDEMA 08/27/2009   ELEVATED BLOOD PRESSURE WITHOUT DIAGNOSIS OF HYPERTENSION 08/27/2009   BENIGN PROSTATIC HYPERTROPHY, MILD, HX OF 08/27/2009   OTHER CHRONIC SINUSITIS 08/09/2007   BRONCHITIS, CHRONIC 08/09/2007   HIATAL HERNIA 08/09/2007   Ulcerative colitis (HCC) 08/09/2007      Subjective:    Patient ID: [name], male    DOB: 1941-03-04, 82 y.o.   MRN: [id number]  No chief complaint on file.   HPI:  MARIOS YELLOWHAIR is a 82 y.o. male here for f/u hospital admission of fungemia   C albican complicted from urologic procedure related to stone  07/09/23 id clinic f/u See a&p for detail No complaint today   Allergies  Allergen Reactions   Lipitor [Atorvastatin Calcium] Other (See Comments)    Muscle ache   Ciprofloxacin Diarrhea   Molds & Smuts Rash   Moxifloxacin Rash      Outpatient Medications Prior to Visit  Medication Sig Dispense Refill   acetaminophen (TYLENOL) 500 MG tablet Take 500 mg by mouth every 8 (eight) hours as needed for moderate pain.     albuterol (PROVENTIL HFA;VENTOLIN HFA) 108 (90 Base) MCG/ACT inhaler Inhale 2  puffs into the lungs every 4 (four) hours as needed for wheezing or shortness of breath. 3 Inhaler 3   aspirin EC 81 MG tablet Take 1 tablet (81 mg total) by mouth 2 (two) times daily. 60 tablet 0   Calcium Citrate-Vitamin D (CALCIUM + D PO) Take 1 tablet by mouth daily. (Patient not taking: Reported on 06/05/2023)     cetirizine (ZYRTEC) 10 MG tablet Take 10 mg by mouth daily.     Coenzyme Q10 (COQ-10 PO) Take 1 tablet by mouth daily.     Cyanocobalamin (B-12 SL) Place 1 tablet under the tongue daily. (Patient not taking: Reported on 06/05/2023)     diphenoxylate-atropine (LOMOTIL) 2.5-0.025 MG tablet Take 1 tablet by mouth 2 (two) times daily as needed (ulcerative colitis flare ups).     Ferrous Sulfate (IRON PO) Take 1 tablet by mouth daily. (Patient not taking: Reported on 06/05/2023)     finasteride (PROSCAR) 5 MG tablet Take 5 mg by mouth daily.     fluticasone (FLONASE) 50 MCG/ACT nasal spray Place 1 spray into both nostrils daily as needed for allergies or rhinitis.     fluticasone-salmeterol (ADVAIR) 250-50 MCG/ACT AEPB Inhale 1 puff into the lungs in the morning and at bedtime.     FOLIC ACID PO Take 1 tablet by mouth daily. (Patient not taking: Reported on 06/05/2023)     guaiFENesin (MUCINEX) 600 MG 12 hr tablet Take 600 mg by mouth daily.     loratadine (CLARITIN) 10 MG tablet Take 10 mg by mouth daily.  midodrine (PROAMATINE) 2.5 MG tablet Take 2.5 mg by mouth in the morning, at noon, and at bedtime.     montelukast (SINGULAIR) 10 MG tablet TAKE 1 TABLET BY MOUTH  EVERY EVENING (Patient taking differently: Take 10 mg by mouth at bedtime.) 90 tablet 0   naphazoline-pheniramine (VISINE-A) 0.025-0.3 % ophthalmic solution Place 1 drop into both eyes 4 (four) times daily as needed for eye irritation (itchy eye).     ondansetron (ZOFRAN) 4 MG tablet Take 1 tablet (4 mg total) by mouth every 4 (four) hours as needed for nausea or vomiting. (Patient not taking: Reported on 06/05/2023) 6  tablet 0   oxyCODONE (ROXICODONE) 5 MG immediate release tablet Take 1 tablet (5 mg total) by mouth every 4 (four) hours as needed for severe pain (pain score 7-10). (Patient not taking: Reported on 06/05/2023) 10 tablet 0   oxyCODONE-acetaminophen (PERCOCET) 5-325 MG tablet Take 1 tablet by mouth every 4 (four) hours as needed for severe pain (pain score 7-10). 20 tablet 0   pantoprazole (PROTONIX) 40 MG tablet Take 40 mg by mouth daily.     silodosin (RAPAFLO) 8 MG CAPS capsule Take 8 mg by mouth daily.     tiZANidine (ZANAFLEX) 2 MG tablet Take 1 tablet (2 mg total) by mouth every 6 (six) hours as needed for muscle spasms. 60 tablet 0   vedolizumab (ENTYVIO) 300 MG injection Inject 300 mg into the vein See admin instructions. Every 60 days     VITAMIN D PO Take 1 capsule by mouth daily. (Patient not taking: Reported on 06/05/2023)     No facility-administered medications prior to visit.     Social History   Socioeconomic History   Marital status: Widowed    Spouse name: Not on file   Number of children: Not on file   Years of education: Not on file   Highest education level: Not on file  Occupational History   Not on file  Tobacco Use   Smoking status: Former    Current packs/day: 0.00    Average packs/day: 2.0 packs/day for 15.0 years (30.0 ttl pk-yrs)    Types: Cigarettes    Start date: 08/07/1956    Quit date: 08/08/1971    Years since quitting: 51.9    Passive exposure: Never   Smokeless tobacco: Former    Types: Chew    Quit date: 08/08/1971  Vaping Use   Vaping status: Never Used  Substance and Sexual Activity   Alcohol use: No   Drug use: Never   Sexual activity: Yes  Other Topics Concern   Not on file  Social History Narrative   Not on file   Social Determinants of Health   Financial Resource Strain: Low Risk  (01/30/2023)   Received from [facility]   Overall Financial Resource Strain (CARDIA)    Difficulty of Paying Living Expenses: Not hard at all  Food  Insecurity: No Food Insecurity (06/06/2023)   Hunger Vital Sign    Worried About Running Out of Food in the Last Year: Never true    Ran Out of Food in the Last Year: Never true  Transportation Needs: No Transportation Needs (06/06/2023)   PRAPARE - Administrator, Civil Service (Medical): No    Lack of Transportation (Non-Medical): No  Physical Activity: Unknown (01/30/2023)   Received from [facility]   Exercise Vital Sign    Days of Exercise per Week: 0 days    Minutes of Exercise per Session: Not  on file  Stress: No Stress Concern Present (01/30/2023)   Received from [facility] - Occupational Stress Questionnaire    Feeling of Stress : Not at all  Social Connections: Moderately Integrated (01/30/2023)   Received from [facility]   Social Network    How would you rate your social network (family, work, friends)?: Adequate participation with social networks  Intimate Partner Violence: Not At Risk (06/06/2023)   Humiliation, Afraid, Rape, and Kick questionnaire    Fear of Current or Ex-Partner: No    Emotionally Abused: No    Physically Abused: No    Sexually Abused: No      Review of Systems     Objective:    BP 122/79   Pulse 77   Resp 16   Ht 5\' 10"  (1.778 m)   Wt 156 lb (70.8 kg)   BMI 22.38 kg/m  Nursing note and vital signs reviewed.  Physical Exam     General/constitutional: no distress, pleasant HEENT: Normocephalic, PER, Conj Clear, EOMI, Oropharynx clear Neck supple CV: rrr no mrg Lungs: clear to auscultation, normal respiratory effort Abd: Soft, Nontender Ext: no edema Skin: No Rash Neuro: nonfocal MSK: no peripheral joint swelling/tenderness/warmth; back spines nontender    Labs: Lab Results  Component Value Date   WBC 11.2 (H) 06/20/2023   HGB 13.0 06/20/2023   HCT 41.2 06/20/2023   MCV 91.2 06/20/2023   PLT 482 (H) 06/20/2023   Last metabolic panel Lab Results  Component  Value Date   GLUCOSE 102 (H) 06/08/2023   NA 132 (L) 06/08/2023   K 3.6 06/08/2023   CL 100 06/08/2023   CO2 22 06/08/2023   BUN 30 (H) 06/08/2023   CREATININE 1.26 (H) 06/08/2023   GFRNONAA 57 (L) 06/08/2023   CALCIUM 8.7 (L) 06/08/2023   PROT 7.2 05/29/2023   ALBUMIN 3.8 05/29/2023   BILITOT 0.6 05/29/2023   ALKPHOS 66 05/29/2023   AST 20 05/29/2023   ALT 23 05/29/2023   ANIONGAP 10 06/08/2023    Micro:  Serology:  Imaging: Reviewed   06/07/23 tte  1. Left ventricular ejection fraction, by estimation, is 60 to 65%. The  left ventricle has normal function. The left ventricle has no regional  wall motion abnormalities. Left ventricular diastolic parameters were  normal.   2. Right ventricular systolic function is normal. The right ventricular  size is normal. There is mildly elevated pulmonary artery systolic  pressure.   3. The mitral valve is normal in structure. Trivial mitral valve  regurgitation.   4. The aortic valve is tricuspid. Aortic valve regurgitation is not  visualized.   5. The inferior vena cava is dilated in size with <50% respiratory  variability, suggesting right atrial pressure of 15 mmHg.    Assessment & Plan:   Problem List Items Addressed This Visit   None Visit Diagnoses     Fungemia    -  Primary         No orders of the defined types were placed in this encounter.                                                  Assessment: 82 yo male with recent right kidney stone s/p ESWL on 10/28 but developed sepsis within 24 hours admitted for same, found to  have fungemia   A right ureteral stent was placed 10/29 this admission   10/29 bcx grew candida albican. Source likely urinary   No visual complaint.   Tte negative for vegetation 10/31 Repeat bcx clear  --------- 07/09/23 id clinic assessment Patient saw urology 06/29/23 and had right ureteroscopy with laser lithotripsy and basket extraction of stone, including right  ureteral stent exchange He has been doing well since hospital discharge 06/08/23 and recently after the urologic procedure mentioned Patient had removed the stent a few days after the procedure  He had finished 2 weeks fluconazole from 06/07/23   No fever, chill, dysuria, flank pain Eating well and appetite is returning  Patient had dilated eye exam 3 weeks prior to this clinic visit and was told no lesion of concern      Follow-up: No follow-ups on file.      Raymondo Band, MD [facility] 07/09/2023, 10:51 AM

## 2024-08-18 NOTE — Progress Notes (Signed)
" °  Subjective       Patient presents with   Shoulder Pain    Left shoulder pain finished antibiotic and Thursday for a lung bacteria infection pain in shoulder started on Friday pain radiates to left side of neck to left ear     History of Present Illness  84 year old male with past medical history of GERD, ulcerative colitis, and hyperlipidemia, presents to the clinic today for evaluation of right shoulder pain.  Patient recently saw pulmonology was started on a 10-day course of Levaquin.  Just finished up 2 days ago when the patient stated his right arm felt sore denies any numbness, tingling, snap, or pop.  Limited range of motion patient is not able to flex or adduct right arm.  Patient states he is unable to pull up his pants.  Denies chest pain, shortness of breath, abdominal pain pain radiation to the neck or jaw.  Patient Health Questionnaire-2 PHQ-2 Total Score: 0 Interpretation of Score for Depression (Payor): Negative     Objective  Vitals:   08/18/24 1407  BP: 120/76  BP Location: Left Upper Arm  Patient Position: Sitting  Pulse: 95  Resp: 16  Temp: 98.2 F (36.8 C)  TempSrc: Oral  SpO2: 94%  Weight: 159 lb 12.8 oz (72.5 kg)  Height: 5' 9 (1.753 m)   Physical Exam Vitals and nursing note reviewed.  Constitutional:      Appearance: Normal appearance.  Cardiovascular:     Rate and Rhythm: Normal rate and regular rhythm.     Heart sounds: Normal heart sounds.  Musculoskeletal:     Right shoulder: Swelling and tenderness present. Decreased range of motion.     Left shoulder: Normal.     Cervical back: Normal range of motion.     Comments: limited flexion, abduction, external and internal rotation.   Pulmonary:     Effort: Pulmonary effort is normal.     Breath sounds: Normal breath sounds.  Neurological:     Mental Status: He is alert.          Assessment & Plan Pain and swelling of shoulder, right Shoulder is swollen and warm Decreased range of  motion Will send urgently to orthopedics.  Orders:   Ambulatory referral to Orthopedic Surgery; Future  Gastroesophageal reflux disease, unspecified whether esophagitis present Protonix  40 mg Compliant with medication.  No side effects Continue present medical management     Ulcerative colitis with complication, unspecified location (*) Followed by GI Vedolizumab  300 mg injection Compliant with medication.  No side effects Continue present medical management             "

## 2024-08-20 ENCOUNTER — Other Ambulatory Visit: Payer: Self-pay

## 2024-08-20 ENCOUNTER — Ambulatory Visit: Attending: Sports Medicine

## 2024-08-20 DIAGNOSIS — M25512 Pain in left shoulder: Secondary | ICD-10-CM | POA: Diagnosis present

## 2024-08-20 DIAGNOSIS — M6281 Muscle weakness (generalized): Secondary | ICD-10-CM | POA: Insufficient documentation

## 2024-08-20 DIAGNOSIS — M25612 Stiffness of left shoulder, not elsewhere classified: Secondary | ICD-10-CM | POA: Diagnosis present

## 2024-08-20 NOTE — Therapy (Signed)
 " OUTPATIENT PHYSICAL THERAPY SHOULDER EVALUATION   Patient Name: [name] MRN: [id number] DOB:1940-11-09, 84 y.o., male Today's Date: 08/20/2024  END OF SESSION:  PT End of Session - 08/20/24 1445     Visit Number 1    Date for Recertification  10/15/24    Progress Note Due on Visit 10    PT Start Time 1300    PT Stop Time 1345    PT Time Calculation (min) 45 min    Activity Tolerance Patient tolerated treatment well    Behavior During Therapy [facility] for tasks assessed/performed          Past Medical History:  Diagnosis Date   Arthritis    joints (05/09/2018)   Asthma    Chronic bronchitis    COPD (chronic obstructive pulmonary disease) (HCC)    Depression    GERD (gastroesophageal reflux disease)    Hearing loss in left ear    High cholesterol    History of hiatal hernia    History of kidney stones    Lymphedema of left leg    foot (05/09/2018)   Mycobacterium avium complex (HCC)    Peripheral edema    Pneumonia 04/2018   pt. denies   Sinusitis    Ulcerative colitis    Past Surgical History:  Procedure Laterality Date   BIOPSY  03/18/2019   Procedure: BIOPSY;  Surgeon: Donnald Charleston, MD;  Location: [facility];  Service: Endoscopy;;  Egd and Flex   CATARACT EXTRACTION W/ INTRAOCULAR LENS  IMPLANT, BILATERAL Bilateral    COLONOSCOPY W/ BIOPSIES AND POLYPECTOMY     several with polys removed   CYSTOSCOPY WITH STENT PLACEMENT Right 06/05/2023   Procedure: CYSTOSCOPY WITH STENT PLACEMENT;  Surgeon: Carolee Sherwood JONETTA DOUGLAS, MD;  Location: [facility];  Service: Urology;  Laterality: Right;   CYSTOSCOPY/URETEROSCOPY/HOLMIUM LASER Right 06/29/2023   Procedure: CYSTOSCOPY/RIGHT RETROGRADE PYELOGRAM/RIGHT URETEROSCOPY/HOLMIUM LASER/RIGHT STENT;  Surgeon: Selma Donnice SAUNDERS, MD;  Location: [facility];  Service: Urology;  Laterality: Right;   ESOPHAGOGASTRODUODENOSCOPY (EGD) WITH PROPOFOL  N/A 03/18/2019   Procedure: ESOPHAGOGASTRODUODENOSCOPY (EGD) WITH PROPOFOL ;  Surgeon:  Donnald Charleston, MD;  Location: [facility];  Service: Endoscopy;  Laterality: N/A;  Will need enteroscope    EXTRACORPOREAL SHOCK WAVE LITHOTRIPSY Right 06/04/2023   Procedure: RIGHT EXTRACORPOREAL SHOCK WAVE LITHOTRIPSY (ESWL);  Surgeon: Renda Glance, MD;  Location: [facility];  Service: Urology;  Laterality: Right;  75 MINUTES NEEDED FOR CASE   FLEXIBLE SIGMOIDOSCOPY N/A 03/18/2019   Procedure: FLEXIBLE SIGMOIDOSCOPY;  Surgeon: Donnald Charleston, MD;  Location: [facility];  Service: Endoscopy;  Laterality: N/A;  Unprepped   HERNIA REPAIR  2013   x 3 hernias repaired   JOINT REPLACEMENT     SHOULDER OPEN ROTATOR CUFF REPAIR Bilateral    TOTAL KNEE ARTHROPLASTY Right 08/13/2015   Procedure: RIGHT TOTAL KNEE ARTHROPLASTY;  Surgeon: Dempsey Moan, MD;  Location: [facility];  Service: Orthopedics;  Laterality: Right;   TOTAL KNEE ARTHROPLASTY Left 09/04/2022   Procedure: LEFT TOTAL KNEE ARTHROPLASTY;  Surgeon: Liam Dempsey, MD;  Location: [facility];  Service: Orthopedics;  Laterality: Left;   Patient Active Problem List   Diagnosis Date Noted   UTI (urinary tract infection) 06/05/2023   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) 05/09/2018   Asthma 05/09/2018   Degenerative arthritis of left knee 08/13/2015   Chronic bronchitis with acute exacerbation (HCC) 09/23/2013   Atherosclerosis of native artery of extremity with intermittent claudication 06/18/2012   Pain in limb 06/18/2012  Claudication 05/30/2012   Frequency of urination 03/13/2012   General medical examination 05/29/2011   Myalgia 04/04/2011   DERMATOPHYTOSIS OF NAIL 09/22/2010   Inguinal hernia 09/22/2010   DECREASED HEARING 12/23/2009   RHINITIS 12/23/2009   UNSPECIFIED DISORDER OF SKIN&SUBCUTANEOUS TISSUE 12/23/2009   Hyperlipidemia 09/13/2009   LYMPHEDEMA 08/27/2009   ELEVATED BLOOD PRESSURE WITHOUT DIAGNOSIS OF HYPERTENSION 08/27/2009   BENIGN PROSTATIC HYPERTROPHY, MILD, HX OF  08/27/2009   OTHER CHRONIC SINUSITIS 08/09/2007   BRONCHITIS, CHRONIC 08/09/2007   HIATAL HERNIA 08/09/2007   Ulcerative colitis (HCC) 08/09/2007    PCP: Dorcus Charleston MD  REFERRING PROVIDER: Curtis Ned MD  REFERRING DIAG: L shoulder pain  THERAPY DIAG:  Acute pain of left shoulder  Stiffness of left shoulder, not elsewhere classified  Muscle weakness (generalized)  Rationale for Evaluation and Treatment: Rehabilitation  ONSET DATE: 08/06/24  SUBJECTIVE:                                                                                                                                                                                      SUBJECTIVE STATEMENT: My L shoulder locked up right after I completed my antibiotic pac for my bronchial infection.  Then my shoulder suddenly locked up and was really painful .   Hand dominance: Right  PERTINENT HISTORY: Referred by PCP due to L shoulder pain.  Prior to this incident had respiratory infection requiring antibiotics.  Then developed sudden onset of L shoulder locking up. Had shot L shoulder at MD this week, yesterday which helped immediately.  PAIN:  Are you having pain? Yes: NPRS scale: 0-4 Pain location: L ant shoulder  Pain description: was locked up now better but hard to lift  Aggravating factors: active abduction vs resistance Relieving factors: rest, injection yesterday helped  PRECAUTIONS: Other: COPD  RED FLAGS:None    WEIGHT BEARING RESTRICTIONS: No  FALLS:  Has patient fallen in last 6 months? No  LIVING ENVIRONMENT: Lives with: lives alone Lives in: House/apartment Stairs: No Has following equipment at home: None  OCCUPATION: retired  PLOF: Independent  PATIENT GOALS:avoid locking up of L shoulder again, improve flexibility L shoulder  NEXT MD VISIT:   OBJECTIVE:  Note: Objective measures were completed at Evaluation unless otherwise noted.  DIAGNOSTIC FINDINGS:  None   PATIENT  SURVEYS:  QuickDASH Score: 50.0 / 100 = 50.0 %  COGNITION: Overall cognitive status: Within functional limits for tasks assessed     SENSATION: WFL  POSTURE: Lean male , prominent accessory muscle   UPPER EXTREMITY ROM:   Active /PROM Right eval Left eval  Shoulder flexion 65/122 45/122  Shoulder extension    Shoulder abduction 72/95  48/100  Shoulder adduction    Shoulder internal rotation full full  Shoulder external rotation 80 80  Elbow flexion    Elbow extension    Wrist flexion    Wrist extension    Wrist ulnar deviation    Wrist radial deviation    Wrist pronation    Wrist supination    (Blank rows = not tested)  UPPER EXTREMITY MMT:  MMT Right eval Left eval  Shoulder flexion 3- 2+  Shoulder extension    Shoulder abduction 3- 2+  Shoulder adduction    Shoulder internal rotation 4- 4-  Shoulder external rotation 3- 3-  Middle trapezius    Lower trapezius    Elbow flexion    Elbow extension    Wrist flexion    Wrist extension    Wrist ulnar deviation    Wrist radial deviation    Wrist pronation    Wrist supination    Grip strength (lbs)    (Blank rows = wnl)  SHOULDER SPECIAL TESTS:NA JOINT MOBILITY TESTING:  Stiff A/P glides L humeral head, and inferior glides L humeral head  PALPATION:  Diminished muscle mass L supraspinatus noted, non tender with palpation L shoulder                                                                                                                              TREATMENT DATE: 08/20/24:  Shoulder pulley   PATIENT EDUCATION: Education details: POC, goals Person educated: Patient Education method: Explanation, Demonstration, Tactile cues, Verbal cues, and Handouts Education comprehension: verbalized understanding and returned demonstration  HOME EXERCISE PROGRAM: Access Code: [email or web address] URL: [email or web address] Date: 08/20/2024 Prepared by: Greig Credit  Exercises - Seated Shoulder Row with  Resistance Anchored at Feet  - 1 x daily - 3-4 x weekly - 2 sets - 10 reps - Seated Shoulder Flexion AAROM with Pulley Behind  - 1 x daily - 3-4 x weekly - 2 sets - 10 reps - Standing Shoulder Abduction AAROM with Dowel  - 1 x daily - 3-4 x weekly - 2 sets - 10 reps  ASSESSMENT:  CLINICAL IMPRESSION: Patient is a 84 y.o. male who was seen today for physical therapy evaluation and treatment for sudden onset of L shoulder pain, which occurred less than a week ago, also with loss of ROM.  He was referred to PT to assist with regaining his movement and flexibility, pain management L shoulder. He reports long term loss of strength and AROM B shoulders due to rotator cuff injuries, but increased pain/ stiffness and weakness L shoulder with this sudden onset of pain L shoulder.  Demonstrated ex to assist him with improving and maintaining his flexibility B shoulders today.  He thinks that he had a response to his antibiotic and is much improved after his injection. Educated him regarding the importance of light stretching  to maintain jt integrity and mobility of his shoulders.  OBJECTIVE IMPAIRMENTS: decreased ROM, decreased strength,  hypomobility, increased fascial restrictions, impaired perceived functional ability, impaired flexibility, impaired UE functional use, improper body mechanics, postural dysfunction, and pain.   ACTIVITY LIMITATIONS: carrying, lifting, sleeping, reach over head, and caring for others  PARTICIPATION LIMITATIONS: laundry, driving, community activity, and yard work  PERSONAL FACTORS: Age, Behavior pattern, Past/current experiences, Time since onset of injury/illness/exacerbation, and 1-2 comorbidities: COPD, h/o B rotator cuff repairs are also affecting patient's functional outcome.   REHAB POTENTIAL: Good  CLINICAL DECISION MAKING: Evolving/moderate complexity  EVALUATION COMPLEXITY: Moderate   GOALS: Goals reviewed with patient? Yes  SHORT TERM GOALS: Target date: 2  weeks 09/03/24  I HEP Baseline: Goal status: INITIAL  LONG TERM GOALS: Target date: 8 weeks, 10/15/24  QuickDASH Score: 50.0 / 100 = 50.0 %, improve to 40% deficit or less Baseline:  Goal status: INITIAL  2.  Improve active L shoulder flex, and abd to 80 degrees Baseline: see chart above  Goal status: INITIAL  3.  Strength L shoulder flex and abd improve to 3 /5 Baseline: 2+ to 3- Goal status: INITIAL   PLAN:  PT FREQUENCY: 1x/week  PT DURATION: 8 weeks  PLANNED INTERVENTIONS: 97110-Therapeutic exercises, 97530- Therapeutic activity, [id number]- Neuromuscular re-education, 97535- Self Care, [id number]- Manual therapy, 97033- Ionotophoresis 4mg /ml Dexamethasone , and Patient/Family education  PLAN FOR NEXT SESSION: review and reassess pain and ROM L shoulder, advance, review HEP, manual stretching and jt mobs L shoulder   Elleah Hemsley L Sherron Mapp, PT, DPT, OCS 08/20/2024, 3:06 PM  "

## 2024-08-27 ENCOUNTER — Encounter: Payer: Self-pay | Admitting: Physical Therapy

## 2024-08-27 ENCOUNTER — Ambulatory Visit: Admitting: Physical Therapy

## 2024-08-27 DIAGNOSIS — M25612 Stiffness of left shoulder, not elsewhere classified: Secondary | ICD-10-CM

## 2024-08-27 DIAGNOSIS — M25512 Pain in left shoulder: Secondary | ICD-10-CM | POA: Diagnosis not present

## 2024-08-27 DIAGNOSIS — M6281 Muscle weakness (generalized): Secondary | ICD-10-CM

## 2024-08-27 NOTE — Therapy (Signed)
 " OUTPATIENT PHYSICAL THERAPY SHOULDER TREATMENT   Patient Name: [name] MRN: [id number] DOB:Dec 21, 1940, 84 y.o., male Today's Date: 08/27/2024  END OF SESSION:  PT End of Session - 08/27/24 1143     Visit Number 2    Date for Recertification  10/15/24    PT Start Time 1145    PT Stop Time 1230    PT Time Calculation (min) 45 min    Activity Tolerance Patient tolerated treatment well    Behavior During Therapy [facility] for tasks assessed/performed          Past Medical History:  Diagnosis Date   Arthritis    joints (05/09/2018)   Asthma    Chronic bronchitis    COPD (chronic obstructive pulmonary disease) (HCC)    Depression    GERD (gastroesophageal reflux disease)    Hearing loss in left ear    High cholesterol    History of hiatal hernia    History of kidney stones    Lymphedema of left leg    foot (05/09/2018)   Mycobacterium avium complex (HCC)    Peripheral edema    Pneumonia 04/2018   pt. denies   Sinusitis    Ulcerative colitis    Past Surgical History:  Procedure Laterality Date   BIOPSY  03/18/2019   Procedure: BIOPSY;  Surgeon: Donnald Charleston, MD;  Location: [facility];  Service: Endoscopy;;  Egd and Flex   CATARACT EXTRACTION W/ INTRAOCULAR LENS  IMPLANT, BILATERAL Bilateral    COLONOSCOPY W/ BIOPSIES AND POLYPECTOMY     several with polys removed   CYSTOSCOPY WITH STENT PLACEMENT Right 06/05/2023   Procedure: CYSTOSCOPY WITH STENT PLACEMENT;  Surgeon: Carolee Sherwood JONETTA DOUGLAS, MD;  Location: [facility];  Service: Urology;  Laterality: Right;   CYSTOSCOPY/URETEROSCOPY/HOLMIUM LASER Right 06/29/2023   Procedure: CYSTOSCOPY/RIGHT RETROGRADE PYELOGRAM/RIGHT URETEROSCOPY/HOLMIUM LASER/RIGHT STENT;  Surgeon: Selma Donnice SAUNDERS, MD;  Location: [facility];  Service: Urology;  Laterality: Right;   ESOPHAGOGASTRODUODENOSCOPY (EGD) WITH PROPOFOL  N/A 03/18/2019   Procedure: ESOPHAGOGASTRODUODENOSCOPY (EGD) WITH PROPOFOL ;  Surgeon: Donnald Charleston, MD;  Location: [facility];  Service: Endoscopy;  Laterality: N/A;  Will need enteroscope    EXTRACORPOREAL SHOCK WAVE LITHOTRIPSY Right 06/04/2023   Procedure: RIGHT EXTRACORPOREAL SHOCK WAVE LITHOTRIPSY (ESWL);  Surgeon: Renda Glance, MD;  Location: [facility];  Service: Urology;  Laterality: Right;  75 MINUTES NEEDED FOR CASE   FLEXIBLE SIGMOIDOSCOPY N/A 03/18/2019   Procedure: FLEXIBLE SIGMOIDOSCOPY;  Surgeon: Donnald Charleston, MD;  Location: [facility];  Service: Endoscopy;  Laterality: N/A;  Unprepped   HERNIA REPAIR  2013   x 3 hernias repaired   JOINT REPLACEMENT     SHOULDER OPEN ROTATOR CUFF REPAIR Bilateral    TOTAL KNEE ARTHROPLASTY Right 08/13/2015   Procedure: RIGHT TOTAL KNEE ARTHROPLASTY;  Surgeon: Dempsey Moan, MD;  Location: [facility];  Service: Orthopedics;  Laterality: Right;   TOTAL KNEE ARTHROPLASTY Left 09/04/2022   Procedure: LEFT TOTAL KNEE ARTHROPLASTY;  Surgeon: Liam Dempsey, MD;  Location: [facility];  Service: Orthopedics;  Laterality: Left;   Patient Active Problem List   Diagnosis Date Noted   UTI (urinary tract infection) 06/05/2023   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) 05/09/2018   Asthma 05/09/2018   Degenerative arthritis of left knee 08/13/2015   Chronic bronchitis with acute exacerbation (HCC) 09/23/2013   Atherosclerosis of native artery of extremity with intermittent claudication 06/18/2012   Pain in limb 06/18/2012   Claudication 05/30/2012   Frequency of urination 03/13/2012  General medical examination 05/29/2011   Myalgia 04/04/2011   DERMATOPHYTOSIS OF NAIL 09/22/2010   Inguinal hernia 09/22/2010   DECREASED HEARING 12/23/2009   RHINITIS 12/23/2009   UNSPECIFIED DISORDER OF SKIN&SUBCUTANEOUS TISSUE 12/23/2009   Hyperlipidemia 09/13/2009   LYMPHEDEMA 08/27/2009   ELEVATED BLOOD PRESSURE WITHOUT DIAGNOSIS OF HYPERTENSION 08/27/2009   BENIGN PROSTATIC HYPERTROPHY, MILD, HX OF 08/27/2009   OTHER CHRONIC SINUSITIS  08/09/2007   BRONCHITIS, CHRONIC 08/09/2007   HIATAL HERNIA 08/09/2007   Ulcerative colitis (HCC) 08/09/2007    PCP: Dorcus Charleston MD  REFERRING PROVIDER: Curtis Ned MD  REFERRING DIAG: L shoulder pain  THERAPY DIAG:  Acute pain of left shoulder  Stiffness of left shoulder, not elsewhere classified  Muscle weakness (generalized)  Rationale for Evaluation and Treatment: Rehabilitation  ONSET DATE: 08/06/24  SUBJECTIVE:                                                                                                                                                                                      SUBJECTIVE STATEMENT:  Shoulder feel good, the shot helped    My L shoulder locked up right after I completed my antibiotic pac for my bronchial infection.  Then my shoulder suddenly locked up and was really painful .   Hand dominance: Right  PERTINENT HISTORY: Referred by PCP due to L shoulder pain.  Prior to this incident had respiratory infection requiring antibiotics.  Then developed sudden onset of L shoulder locking up. Had shot L shoulder at [facility] this week, yesterday which helped immediately.  PAIN:  Are you having pain? Yes: NPRS scale: 0/10 Pain location: L ant shoulder  Pain description: was locked up now better but hard to lift  Aggravating factors: active abduction vs resistance Relieving factors: rest, injection yesterday helped  PRECAUTIONS: Other: COPD  RED FLAGS:None    WEIGHT BEARING RESTRICTIONS: No  FALLS:  Has patient fallen in last 6 months? No  LIVING ENVIRONMENT: Lives with: lives alone Lives in: House/apartment Stairs: No Has following equipment at home: None  OCCUPATION: retired  PLOF: Independent  PATIENT GOALS:avoid locking up of L shoulder again, improve flexibility L shoulder  NEXT MD VISIT:   OBJECTIVE:  Note: Objective measures were completed at Evaluation unless otherwise noted.  DIAGNOSTIC FINDINGS:  None    PATIENT SURVEYS:  QuickDASH Score: 50.0 / 100 = 50.0 %  COGNITION: Overall cognitive status: Within functional limits for tasks assessed     SENSATION: WFL  POSTURE: Lean male , prominent accessory muscle   UPPER EXTREMITY ROM:   Active /PROM Right eval Left eval  Shoulder flexion 65/122 45/122  Shoulder extension    Shoulder abduction 72/95  48/100  Shoulder adduction    Shoulder internal rotation full full  Shoulder external rotation 80 80  Elbow flexion    Elbow extension    Wrist flexion    Wrist extension    Wrist ulnar deviation    Wrist radial deviation    Wrist pronation    Wrist supination    (Blank rows = not tested)  UPPER EXTREMITY MMT:  MMT Right eval Left eval  Shoulder flexion 3- 2+  Shoulder extension    Shoulder abduction 3- 2+  Shoulder adduction    Shoulder internal rotation 4- 4-  Shoulder external rotation 3- 3-  Middle trapezius    Lower trapezius    Elbow flexion    Elbow extension    Wrist flexion    Wrist extension    Wrist ulnar deviation    Wrist radial deviation    Wrist pronation    Wrist supination    Grip strength (lbs)    (Blank rows = wnl)  SHOULDER SPECIAL TESTS:NA JOINT MOBILITY TESTING:  Stiff A/P glides L humeral head, and inferior glides L humeral head  PALPATION:  Diminished muscle mass L supraspinatus noted, non tender with palpation L shoulder                                                                                                                              TREATMENT DATE:  08/27/24 NuStep L4 x 6 min Rows & Ext red 2x10 AAROM Flex, abd, IR 1lb WaTE  Shoulder ER yellow 2x10 Triceps Ext 20lb 2x10  08/20/24:  Shoulder pulley   PATIENT EDUCATION: Education details: POC, goals Person educated: Patient Education method: Explanation, Demonstration, Tactile cues, Verbal cues, and Handouts Education comprehension: verbalized understanding and returned demonstration  HOME EXERCISE  PROGRAM: Access Code: [email or web address] URL: [email or web address] Date: 08/20/2024 Prepared by: Greig Credit  Exercises - Seated Shoulder Row with Resistance Anchored at Feet  - 1 x daily - 3-4 x weekly - 2 sets - 10 reps - Seated Shoulder Flexion AAROM with Pulley Behind  - 1 x daily - 3-4 x weekly - 2 sets - 10 reps - Standing Shoulder Abduction AAROM with Dowel  - 1 x daily - 3-4 x weekly - 2 sets - 10 reps  ASSESSMENT:  CLINICAL IMPRESSION: Patient is a 84 y.o. male who was seen today for physical therapy treatment for sudden onset of L shoulder pain, which occurred less than a week ago, also with loss of ROM. He enters doing well, stating his shoulder has gone back to how they where before antibiotic pac. Pt reports a history of rotator cuff tears, and reported that he currently has one now. Shoulder ER movement pattern did shoe signs of rotator cuff impairment. He stated the shot hs received help and is pleased with his current functional status.    OBJECTIVE IMPAIRMENTS: decreased ROM, decreased strength, hypomobility, increased fascial restrictions, impaired perceived functional ability, impaired flexibility, impaired UE functional use,  improper body mechanics, postural dysfunction, and pain.   ACTIVITY LIMITATIONS: carrying, lifting, sleeping, reach over head, and caring for others  PARTICIPATION LIMITATIONS: laundry, driving, community activity, and yard work  PERSONAL FACTORS: Age, Behavior pattern, Past/current experiences, Time since onset of injury/illness/exacerbation, and 1-2 comorbidities: COPD, h/o B rotator cuff repairs are also affecting patient's functional outcome.   REHAB POTENTIAL: Good  CLINICAL DECISION MAKING: Evolving/moderate complexity  EVALUATION COMPLEXITY: Moderate   GOALS: Goals reviewed with patient? Yes  SHORT TERM GOALS: Target date: 2 weeks 09/03/24  I HEP Baseline: Goal status: Met 08/27/24  LONG TERM GOALS: Target date: 8 weeks,  10/15/24  QuickDASH Score: 50.0 / 100 = 50.0 %, improve to 40% deficit or less Baseline:  Goal status: ongoing 08/27/24  2.  Improve active L shoulder flex, and abd to 80 degrees Baseline: see chart above  Goal status: ongoing 08/27/24  3.  Strength L shoulder flex and abd improve to 3 /5 Baseline: 2+ to 3- Goal status: ongoing 08/27/24   PLAN:  PT FREQUENCY: 1x/week  PT DURATION: 8 weeks  PLANNED INTERVENTIONS: 97110-Therapeutic exercises, 97530- Therapeutic activity, 97112- Neuromuscular re-education, 97535- Self Care, [id number]- Manual therapy, [phone]- Ionotophoresis 4mg /ml Dexamethasone , and Patient/Family education  PLAN FOR NEXT SESSION: rD/C PT  PHYSICAL THERAPY DISCHARGE SUMMARY  Visits from Start of Care: 2  Patient agrees to discharge. Patient goals were not met. Patient is being discharged due to being pleased with the current functional level.    Tanda KANDICE Sorrow, PTA 08/27/2024, 11:43 AM  "

## 2024-09-01 ENCOUNTER — Ambulatory Visit

## 2024-09-08 ENCOUNTER — Ambulatory Visit

## 2024-09-15 ENCOUNTER — Ambulatory Visit
